# Patient Record
Sex: Male | Born: 1945 | Race: White | Hispanic: No | Smoking: Never smoker
Health system: Southern US, Community
[De-identification: ages and names within clinical notes are randomized; demographics above are authoritative.]

## PROBLEM LIST (undated history)

## (undated) DIAGNOSIS — N400 Enlarged prostate without lower urinary tract symptoms: Secondary | ICD-10-CM

## (undated) DIAGNOSIS — E785 Hyperlipidemia, unspecified: Secondary | ICD-10-CM

## (undated) DIAGNOSIS — F329 Major depressive disorder, single episode, unspecified: Secondary | ICD-10-CM

## (undated) DIAGNOSIS — I10 Essential (primary) hypertension: Secondary | ICD-10-CM

## (undated) DIAGNOSIS — T7840XA Allergy, unspecified, initial encounter: Secondary | ICD-10-CM

## (undated) HISTORY — DX: Hyperlipidemia, unspecified: E78.5

## (undated) HISTORY — DX: Benign prostatic hyperplasia without lower urinary tract symptoms: N40.0

## (undated) HISTORY — PX: NO PAST SURGERIES: SHX2092

## (undated) HISTORY — DX: Major depressive disorder, single episode, unspecified: F32.9

## (undated) HISTORY — PX: COLONOSCOPY: SHX174

## (undated) HISTORY — DX: Essential (primary) hypertension: I10

## (undated) HISTORY — DX: Allergy, unspecified, initial encounter: T78.40XA

---

## 2004-01-07 ENCOUNTER — Ambulatory Visit: Payer: Self-pay | Admitting: Internal Medicine

## 2004-01-19 ENCOUNTER — Ambulatory Visit: Payer: Self-pay | Admitting: Gastroenterology

## 2004-02-16 ENCOUNTER — Encounter: Payer: Self-pay | Admitting: Internal Medicine

## 2004-02-16 ENCOUNTER — Ambulatory Visit: Payer: Self-pay | Admitting: Gastroenterology

## 2004-07-07 ENCOUNTER — Ambulatory Visit: Payer: Self-pay | Admitting: Internal Medicine

## 2005-01-07 ENCOUNTER — Ambulatory Visit: Payer: Self-pay | Admitting: Internal Medicine

## 2005-01-14 ENCOUNTER — Ambulatory Visit: Payer: Self-pay | Admitting: Internal Medicine

## 2006-01-13 ENCOUNTER — Ambulatory Visit: Payer: Self-pay | Admitting: Internal Medicine

## 2006-01-13 LAB — CONVERTED CEMR LAB
BUN: 16 mg/dL (ref 6–23)
Basophils Absolute: 0 10*3/uL (ref 0.0–0.1)
CO2: 33 meq/L — ABNORMAL HIGH (ref 19–32)
Calcium: 9.9 mg/dL (ref 8.4–10.5)
Chloride: 106 meq/L (ref 96–112)
Creatinine, Ser: 1.1 mg/dL (ref 0.4–1.5)
HDL: 36.7 mg/dL — ABNORMAL LOW (ref >39.0)
Hemoglobin: 16.1 g/dL (ref 13.0–17.0)
LDL DIRECT: 99.7 mg/dL
Lymphocytes Relative: 28.5 % (ref 12.0–46.0)
MCHC: 35 g/dL (ref 30.0–36.0)
MCV: 88.2 fL (ref 78.0–100.0)
Monocytes Relative: 8 % (ref 3.0–11.0)
Neutro Abs: 3.8 10*3/uL (ref 1.4–7.7)
Neutrophils Relative %: 61.1 % (ref 43.0–77.0)
Potassium: 4 meq/L (ref 3.5–5.1)
TSH: 0.86 microintl units/mL (ref 0.35–5.50)
Triglyceride fasting, serum: 222 mg/dL (ref 0–149)

## 2006-01-20 ENCOUNTER — Ambulatory Visit: Payer: Self-pay | Admitting: Internal Medicine

## 2006-05-16 ENCOUNTER — Ambulatory Visit: Payer: Self-pay | Admitting: Internal Medicine

## 2006-06-20 ENCOUNTER — Ambulatory Visit: Payer: Self-pay | Admitting: Internal Medicine

## 2006-08-01 ENCOUNTER — Ambulatory Visit: Payer: Self-pay | Admitting: Internal Medicine

## 2006-08-01 ENCOUNTER — Encounter: Payer: Self-pay | Admitting: Internal Medicine

## 2006-08-01 DIAGNOSIS — E785 Hyperlipidemia, unspecified: Secondary | ICD-10-CM | POA: Insufficient documentation

## 2006-08-01 DIAGNOSIS — I10 Essential (primary) hypertension: Secondary | ICD-10-CM

## 2006-08-01 DIAGNOSIS — F3289 Other specified depressive episodes: Secondary | ICD-10-CM

## 2006-08-01 DIAGNOSIS — N400 Enlarged prostate without lower urinary tract symptoms: Secondary | ICD-10-CM

## 2006-08-01 DIAGNOSIS — F329 Major depressive disorder, single episode, unspecified: Secondary | ICD-10-CM

## 2006-08-01 DIAGNOSIS — F339 Major depressive disorder, recurrent, unspecified: Secondary | ICD-10-CM | POA: Insufficient documentation

## 2006-08-01 HISTORY — DX: Benign prostatic hyperplasia without lower urinary tract symptoms: N40.0

## 2006-08-01 HISTORY — DX: Other specified depressive episodes: F32.89

## 2006-08-01 HISTORY — DX: Major depressive disorder, single episode, unspecified: F32.9

## 2006-08-01 HISTORY — DX: Essential (primary) hypertension: I10

## 2006-08-01 HISTORY — DX: Hyperlipidemia, unspecified: E78.5

## 2006-12-28 ENCOUNTER — Encounter: Payer: Self-pay | Admitting: Internal Medicine

## 2007-01-18 ENCOUNTER — Ambulatory Visit: Payer: Self-pay | Admitting: Internal Medicine

## 2007-01-18 LAB — CONVERTED CEMR LAB
ALT: 27 units/L (ref 0–53)
Alkaline Phosphatase: 58 units/L (ref 39–117)
BUN: 19 mg/dL (ref 6–23)
Basophils Absolute: 0 10*3/uL (ref 0.0–0.1)
Blood in Urine, dipstick: NEGATIVE
Calcium: 9.6 mg/dL (ref 8.4–10.5)
Eosinophils Absolute: 0.1 10*3/uL (ref 0.0–0.6)
GFR calc Af Amer: 88 mL/min
GFR calc non Af Amer: 73 mL/min
HDL: 34.7 mg/dL — ABNORMAL LOW (ref >39.0)
Ketones, urine, test strip: NEGATIVE
Lymphocytes Relative: 25.2 % (ref 12.0–46.0)
MCHC: 35.2 g/dL (ref 30.0–36.0)
MCV: 88.2 fL (ref 78.0–100.0)
Monocytes Relative: 6.9 % (ref 3.0–11.0)
Neutro Abs: 3.8 10*3/uL (ref 1.4–7.7)
Nitrite: NEGATIVE
Platelets: 204 10*3/uL (ref 150–400)
Specific Gravity, Urine: 1.015
TSH: 1.24 microintl units/mL (ref 0.35–5.50)
Triglycerides: 125 mg/dL (ref 0–149)
VLDL: 25 mg/dL (ref 0–40)
pH: 7

## 2007-01-25 ENCOUNTER — Ambulatory Visit: Payer: Self-pay | Admitting: Internal Medicine

## 2007-05-10 ENCOUNTER — Encounter: Payer: Self-pay | Admitting: Internal Medicine

## 2007-07-25 ENCOUNTER — Ambulatory Visit: Payer: Self-pay | Admitting: Internal Medicine

## 2007-10-15 ENCOUNTER — Telehealth: Payer: Self-pay | Admitting: Internal Medicine

## 2007-10-22 ENCOUNTER — Telehealth: Payer: Self-pay | Admitting: Internal Medicine

## 2007-11-29 ENCOUNTER — Encounter: Payer: Self-pay | Admitting: Internal Medicine

## 2008-01-14 ENCOUNTER — Telehealth: Payer: Self-pay | Admitting: Internal Medicine

## 2008-01-15 ENCOUNTER — Encounter: Payer: Self-pay | Admitting: Internal Medicine

## 2008-01-15 ENCOUNTER — Telehealth: Payer: Self-pay | Admitting: Internal Medicine

## 2008-01-18 ENCOUNTER — Ambulatory Visit: Payer: Self-pay | Admitting: Internal Medicine

## 2008-01-18 LAB — CONVERTED CEMR LAB
Basophils Absolute: 0 10*3/uL (ref 0.0–0.1)
Basophils Relative: 0.5 % (ref 0.0–3.0)
Bilirubin Urine: NEGATIVE
Bilirubin, Direct: 0.1 mg/dL (ref 0.0–0.3)
Blood in Urine, dipstick: NEGATIVE
CO2: 32 meq/L (ref 19–32)
Calcium: 9.4 mg/dL (ref 8.4–10.5)
Cholesterol: 183 mg/dL (ref 0–200)
Creatinine, Ser: 1 mg/dL (ref 0.4–1.5)
Direct LDL: 99.1 mg/dL
Eosinophils Absolute: 0.1 10*3/uL (ref 0.0–0.7)
GFR calc Af Amer: 98 mL/min
GFR calc non Af Amer: 81 mL/min
Glucose, Urine, Semiquant: NEGATIVE
HDL: 36.7 mg/dL — ABNORMAL LOW (ref >39.0)
Hemoglobin: 15.4 g/dL (ref 13.0–17.0)
Lymphocytes Relative: 27 % (ref 12.0–46.0)
MCHC: 35 g/dL (ref 30.0–36.0)
MCV: 90.5 fL (ref 78.0–100.0)
Neutro Abs: 3.7 10*3/uL (ref 1.4–7.7)
PSA: 0.24 ng/mL (ref 0.10–4.00)
RDW: 12.3 % (ref 11.5–14.6)
Sodium: 146 meq/L — ABNORMAL HIGH (ref 135–145)
Specific Gravity, Urine: 1.015
TSH: 1.01 microintl units/mL (ref 0.35–5.50)
Total Bilirubin: 1 mg/dL (ref 0.3–1.2)
VLDL: 45 mg/dL — ABNORMAL HIGH (ref 0–40)
pH: 8.5

## 2008-01-30 ENCOUNTER — Ambulatory Visit: Payer: Self-pay | Admitting: Internal Medicine

## 2009-02-02 ENCOUNTER — Ambulatory Visit: Payer: Self-pay | Admitting: Internal Medicine

## 2009-09-24 ENCOUNTER — Telehealth: Payer: Self-pay | Admitting: Internal Medicine

## 2009-12-14 ENCOUNTER — Telehealth: Payer: Self-pay | Admitting: Internal Medicine

## 2010-04-04 LAB — CONVERTED CEMR LAB: AST: 25 units/L (ref 0–37)

## 2010-04-06 NOTE — Progress Notes (Signed)
Summary: maxzide caps on back order  Phone Note From Pharmacy   Caller: [facility] DrMarland Kitchen Summary of Call: maxzide capsules are on back-order - can it be changes to tablets?  [facility] - [phone] Initial call taken by: Duard Brady LPN,  September 24, 2009 4:21 PM  Follow-up for Phone Call        yes #90  one daily  RF 4-generic OK Follow-up by: Gordy Savers  MD,  September 24, 2009 5:28 PM  Additional Follow-up for Phone Call Additional follow up Details #1::        [facility] - spoke with jackie. KIK Additional Follow-up by: Duard Brady LPN,  September 24, 2009 5:33 PM    Prescriptions: TRIAMTERENE-HCTZ 37.5-25 MG CAPS (TRIAMTERENE-HCTZ) 1 once daily  #90 Capsule x 4   Entered by:   Duard Brady LPN   Authorized by:   Gordy Savers  MD   Signed by:   Duard Brady LPN on 60/45/4098   Method used:   Historical   RxID:   [id number]

## 2010-04-06 NOTE — Progress Notes (Signed)
Summary: refill temazepam  Phone Note Refill Request Message from:  Fax from Pharmacy on December 14, 2009 9:11 AM  Refills Requested: Medication #1:  TEMAZEPAM 30 MG  CAPS qhs as needed   Last Ref[facility]: [facility]/14/2009 [facility]   Method Requested: Fax to Local Pharmacy Initial call taken by: Duard Brady LPN,  December 14, 2009 9:11 AM    Prescriptions: TEMAZEPAM 30 MG  CAPS (TEMAZEPAM) qhs as needed  #30 x 3   Entered by:   Duard Brady LPN   Authorized by:   Gordy Savers  MD   Signed by:   Duard Brady LPN on 16/12/9602   Method used:   Historical   RxID:   [id number]

## 2010-07-23 NOTE — Assessment & Plan Note (Signed)
[facility]                            [facility] OFFICE NOTE   NAME:Fielding, ARNAV CREGG                        MRN:          [id number]  DATE:01/20/2006                            DOB:          [birth year]    A 65 year old gentleman seen today for an annual exam. He has a history  of hypertension, dyslipidemia, episodic insomnia, and has a history also  of mild BPH.   FAMILY HISTORY:  Reviewed and basically unchanged.   VITAL SIGNS:  Blood pressure 130/78.  SKIN:  Negative.  HEENT:  Fundi, ear, nose and throat clear.  NECK:  No adenopathy or bruits.  CHEST:  Clear.  CARDIOVASCULAR:  Normal heart sounds, no murmurs.  ABDOMEN:  Benign.  EXTERNAL GENITALIA:  Normal.  RECTAL:  Prostate at least +2 and large with left lobe slightly more  prominent. Stool heme negative.  EXTREMITIES:  Negative. Full peripheral pulses.  NEUROLOGIC:  Negative.   IMPRESSION:  1. Hypertension.  2. Dyslipidemia.  3. Seasonal allergic rhinitis.   DISPOSITION:  Will continue his present regimen. Prescriptions  dispensed. Will reassess in one year.     Gordy Savers, MD  Electronically Signed    PFK/MedQ  DD: 01/20/2006  DT: 01/20/2006  Job #: [id number]

## 2010-07-29 ENCOUNTER — Other Ambulatory Visit: Payer: Self-pay | Admitting: Internal Medicine

## 2010-08-31 ENCOUNTER — Other Ambulatory Visit (INDEPENDENT_AMBULATORY_CARE_PROVIDER_SITE_OTHER): Payer: PRIVATE HEALTH INSURANCE

## 2010-08-31 DIAGNOSIS — Z0000 Encounter for general adult medical examination without abnormal findings: Secondary | ICD-10-CM

## 2010-08-31 LAB — BASIC METABOLIC PANEL
Calcium: 9.4 mg/dL (ref 8.4–10.5)
GFR: 71.62 mL/min (ref >60.00)
Potassium: 4.2 mEq/L (ref 3.5–5.1)
Sodium: 143 mEq/L (ref 135–145)

## 2010-08-31 LAB — POCT URINALYSIS DIPSTICK
Bilirubin, UA: NEGATIVE
Ketones, UA: NEGATIVE
Leukocytes, UA: NEGATIVE
pH, UA: 7

## 2010-08-31 LAB — CBC WITH DIFFERENTIAL/PLATELET
Basophils Absolute: 0 10*3/uL (ref 0.0–0.1)
Eosinophils Absolute: 0.2 10*3/uL (ref 0.0–0.7)
HCT: 45.9 % (ref 39.0–52.0)
Hemoglobin: 15.8 g/dL (ref 13.0–17.0)
Lymphs Abs: 1.6 10*3/uL (ref 0.7–4.0)
MCHC: 34.4 g/dL (ref 30.0–36.0)
MCV: 90.4 fl (ref 78.0–100.0)
Monocytes Absolute: 0.5 10*3/uL (ref 0.1–1.0)
Monocytes Relative: 7.3 % (ref 3.0–12.0)
Neutro Abs: 4.7 10*3/uL (ref 1.4–7.7)
RDW: 13 % (ref 11.5–14.6)

## 2010-08-31 LAB — HEPATIC FUNCTION PANEL
Alkaline Phosphatase: 57 U/L (ref 39–117)
Bilirubin, Direct: 0.1 mg/dL (ref 0.0–0.3)
Total Protein: 6.7 g/dL (ref 6.0–8.3)

## 2010-08-31 LAB — LIPID PANEL: VLDL: 70.2 mg/dL — ABNORMAL HIGH (ref 0.0–40.0)

## 2010-08-31 LAB — LDL CHOLESTEROL, DIRECT: Direct LDL: 123.5 mg/dL

## 2010-09-06 ENCOUNTER — Encounter: Payer: Self-pay | Admitting: Internal Medicine

## 2010-09-07 ENCOUNTER — Encounter: Payer: Self-pay | Admitting: Internal Medicine

## 2010-09-07 ENCOUNTER — Ambulatory Visit (INDEPENDENT_AMBULATORY_CARE_PROVIDER_SITE_OTHER): Payer: PRIVATE HEALTH INSURANCE | Admitting: Internal Medicine

## 2010-09-07 VITALS — BP 120/70 | HR 70 | Temp 98.0°F | Resp 18 | Ht 71.0 in | Wt 185.0 lb

## 2010-09-07 DIAGNOSIS — Z0000 Encounter for general adult medical examination without abnormal findings: Secondary | ICD-10-CM

## 2010-09-07 DIAGNOSIS — Z23 Encounter for immunization: Secondary | ICD-10-CM

## 2010-09-07 DIAGNOSIS — I10 Essential (primary) hypertension: Secondary | ICD-10-CM

## 2010-09-07 MED ORDER — PRAVASTATIN SODIUM 20 MG PO TABS: 40.0000 mg | ORAL_TABLET | Freq: Every day | ORAL | Status: DC

## 2010-09-07 MED ORDER — TEMAZEPAM 30 MG PO CAPS: 30.0000 mg | ORAL_CAPSULE | Freq: Every evening | ORAL | Status: DC | PRN

## 2010-09-07 MED ORDER — TRIAMTERENE-HCTZ 37.5-25 MG PO CAPS: 1.0000 | ORAL_CAPSULE | ORAL | Status: DC

## 2010-09-07 MED ORDER — PAROXETINE HCL 20 MG PO TABS: 20.0000 mg | ORAL_TABLET | ORAL | Status: DC

## 2010-09-07 NOTE — Patient Instructions (Signed)
Limit your sodium (Salt) intake    It is important that you exercise regularly, at least 20 minutes 3 to 4 times per week.  If you develop chest pain or shortness of breath seek  medical attention. 

## 2010-09-07 NOTE — Progress Notes (Signed)
Subjective:    Patient ID: Richard Sparks, male    DOB: Jun 03, 1945, 65 y.o.   MRN: [id number]  HPI  65 year old patient who is seen today for an annual exam. Allergies:  1) Amoxicillin (Amoxicillin)   Past History:  Past Medical History:  Reviewed history from 08/01/2006 and no changes required.  Depression  Hypertension  Benign prostatic hypertrophy  Hyperlipidemia  Skin cancer  Past Surgical History:  Reviewed history from 01/30/2008 and no changes required.  no prior surgeries  colonoscopy 2005   Family History:  Reviewed history from 01/25/2007 and no changes required.  father age 59, has a history of a thyroid disorder in type 2 diabetes  mother, age 1, is in good health  One brother 3 sisters, all remain in good health  a niece died at age 62 of a brain tumor. Otherwise no family history of cancer   Social History:  Reviewed history from 01/30/2008 and no changes required.  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  HENT: Negative for hearing loss, ear pain, congestion, rhinorrhea, sneezing, mouth sores, trouble swallowing, neck pain, neck stiffness, dental problem, voice change, sinus pressure and tinnitus.   Eyes: Negative for photophobia, pain, redness and visual disturbance.  Respiratory: Negative for apnea, cough, choking, chest tightness, shortness of breath and wheezing.   Cardiovascular: Negative for chest pain, palpitations and leg swelling.  Gastrointestinal: Negative for nausea, vomiting, abdominal pain, diarrhea, constipation, blood in stool, abdominal distention, anal bleeding and rectal pain.  Genitourinary: Negative for dysuria, urgency, frequency, hematuria, flank pain, decreased urine volume, discharge, penile swelling, scrotal swelling,  difficulty urinating, genital sores and testicular pain.  Musculoskeletal: Negative for myalgias, back pain, joint swelling, arthralgias and gait problem.  Skin: Negative for color change, rash and wound.  Neurological: Negative for dizziness, tremors, seizures, syncope, facial asymmetry, speech difficulty, weakness, light-headedness, numbness and headaches.  Hematological: Negative for adenopathy. Does not bruise/bleed easily.  Psychiatric/Behavioral: Negative for suicidal ideas, hallucinations, behavioral problems, confusion, sleep disturbance, self-injury, dysphoric mood, decreased concentration and agitation. The patient is not nervous/anxious.        Objective:   Physical Exam  Constitutional: He appears well-developed and well-nourished.  HENT:  Head: Normocephalic and atraumatic.  Right Ear: External ear normal.  Left Ear: External ear normal.  Nose: Nose normal.  Mouth/Throat: Oropharynx is clear and moist.  Eyes: Conjunctivae and EOM are normal. Pupils are equal, round, and reactive to light. No scleral icterus.  Neck: Normal range of motion. Neck supple. No JVD present. No thyromegaly present.  Cardiovascular: Regular rhythm, normal heart sounds and intact distal pulses.  Exam reveals no gallop and no friction rub.   No murmur heard. Pulmonary/Chest: Effort normal and breath sounds normal. He exhibits no tenderness.  Abdominal: Soft. Bowel sounds are normal. He exhibits no distension and no mass. There is no tenderness.  Genitourinary: Prostate normal and penis normal.  Musculoskeletal: Normal range of motion. He exhibits no edema and no tenderness.  Lymphadenopathy:    He has no cervical adenopathy.  Neurological: He is alert. He has normal reflexes. No cranial nerve deficit. Coordination normal.  Skin: Skin is warm and dry. No rash noted.  Psychiatric: He has a normal mood and affect. His behavior is normal.  Assessment & Plan:  Annual clinical exam  and Hypertension controlled Dyslipidemia. Will increase pravastatin to 40 mg daily  Recheck 6 months

## 2011-04-27 ENCOUNTER — Encounter: Payer: Self-pay | Admitting: Internal Medicine

## 2011-04-27 ENCOUNTER — Ambulatory Visit (INDEPENDENT_AMBULATORY_CARE_PROVIDER_SITE_OTHER): Payer: PRIVATE HEALTH INSURANCE | Admitting: Internal Medicine

## 2011-04-27 DIAGNOSIS — F329 Major depressive disorder, single episode, unspecified: Secondary | ICD-10-CM

## 2011-04-27 DIAGNOSIS — F3289 Other specified depressive episodes: Secondary | ICD-10-CM

## 2011-04-27 DIAGNOSIS — I10 Essential (primary) hypertension: Secondary | ICD-10-CM

## 2011-04-27 DIAGNOSIS — S90129A Contusion of unspecified lesser toe(s) without damage to nail, initial encounter: Secondary | ICD-10-CM

## 2011-04-27 DIAGNOSIS — E785 Hyperlipidemia, unspecified: Secondary | ICD-10-CM

## 2011-04-27 NOTE — Progress Notes (Signed)
  Subjective:    Patient ID: Richard Sparks, male    DOB: 02-Oct-1945, 66 y.o.   MRN: [id number]  HPI  65 year old patient who presents with a chief complaint of pain and minimal swelling involving his left second toe. He has treated hypertension which has been stable and also a history of depression   Review of Systems  Musculoskeletal: Positive for arthralgias.       Objective:   Physical Exam  Constitutional: He appears well-developed and well-nourished. No distress.       Blood pressure well controlled  Musculoskeletal:       Extremities left toe revealed  minimal soft tissue swelling Pedal pulses were full No signs of active infection or arthritis          Assessment & Plan:   Hypertension stable Contusion left second toe; proper foot care discussed. Will call if unimproved  Schedule CPX 6 months

## 2011-04-27 NOTE — Patient Instructions (Signed)
Limit your sodium (Salt) intake    It is important that you exercise regularly, at least 20 minutes 3 to 4 times per week.  If you develop chest pain or shortness of breath seek  medical attention.  Return in 6 months for follow-up  

## 2011-08-02 ENCOUNTER — Other Ambulatory Visit: Payer: Self-pay | Admitting: Internal Medicine

## 2011-09-12 ENCOUNTER — Other Ambulatory Visit: Payer: Self-pay | Admitting: Internal Medicine

## 2011-09-18 ENCOUNTER — Other Ambulatory Visit: Payer: Self-pay | Admitting: Internal Medicine

## 2011-10-13 ENCOUNTER — Other Ambulatory Visit: Payer: Self-pay

## 2011-10-13 MED ORDER — TEMAZEPAM 30 MG PO CAPS: 30.0000 mg | ORAL_CAPSULE | Freq: Every evening | ORAL | Status: DC | PRN

## 2011-10-18 ENCOUNTER — Other Ambulatory Visit (INDEPENDENT_AMBULATORY_CARE_PROVIDER_SITE_OTHER): Payer: Medicare Other

## 2011-10-18 DIAGNOSIS — Z125 Encounter for screening for malignant neoplasm of prostate: Secondary | ICD-10-CM

## 2011-10-18 DIAGNOSIS — E785 Hyperlipidemia, unspecified: Secondary | ICD-10-CM

## 2011-10-18 DIAGNOSIS — Z79899 Other long term (current) drug therapy: Secondary | ICD-10-CM

## 2011-10-18 DIAGNOSIS — Z0000 Encounter for general adult medical examination without abnormal findings: Secondary | ICD-10-CM

## 2011-10-18 LAB — CBC WITH DIFFERENTIAL/PLATELET
Eosinophils Relative: 3.6 % (ref 0.0–5.0)
HCT: 45.7 % (ref 39.0–52.0)
Hemoglobin: 15.3 g/dL (ref 13.0–17.0)
Lymphs Abs: 1.8 10*3/uL (ref 0.7–4.0)
MCV: 89.9 fl (ref 78.0–100.0)
Monocytes Absolute: 0.4 10*3/uL (ref 0.1–1.0)
Monocytes Relative: 7.7 % (ref 3.0–12.0)
Neutro Abs: 3.1 10*3/uL (ref 1.4–7.7)
RDW: 13 % (ref 11.5–14.6)
WBC: 5.6 10*3/uL (ref 4.5–10.5)

## 2011-10-18 LAB — BASIC METABOLIC PANEL
Chloride: 104 mEq/L (ref 96–112)
GFR: 71.37 mL/min (ref >60.00)
Glucose, Bld: 99 mg/dL (ref 70–99)
Potassium: 4 mEq/L (ref 3.5–5.1)
Sodium: 140 mEq/L (ref 135–145)

## 2011-10-18 LAB — POCT URINALYSIS DIPSTICK
Bilirubin, UA: NEGATIVE
Glucose, UA: NEGATIVE
Ketones, UA: NEGATIVE
Spec Grav, UA: 1.025

## 2011-10-18 LAB — HEPATIC FUNCTION PANEL
ALT: 18 U/L (ref 0–53)
AST: 21 U/L (ref 0–37)
Albumin: 3.7 g/dL (ref 3.5–5.2)

## 2011-10-18 LAB — PSA: PSA: 1.13 ng/mL (ref 0.10–4.00)

## 2011-10-18 LAB — TSH: TSH: 0.97 u[IU]/mL (ref 0.35–5.50)

## 2011-10-18 LAB — LDL CHOLESTEROL, DIRECT: Direct LDL: 111.7 mg/dL

## 2011-10-25 ENCOUNTER — Encounter: Payer: Self-pay | Admitting: Internal Medicine

## 2011-10-25 ENCOUNTER — Ambulatory Visit (INDEPENDENT_AMBULATORY_CARE_PROVIDER_SITE_OTHER): Payer: Medicare Other | Admitting: Internal Medicine

## 2011-10-25 ENCOUNTER — Telehealth: Payer: Self-pay | Admitting: Internal Medicine

## 2011-10-25 VITALS — BP 100/70 | HR 82 | Temp 97.5°F | Resp 18 | Ht 71.0 in | Wt 187.0 lb

## 2011-10-25 DIAGNOSIS — Z23 Encounter for immunization: Secondary | ICD-10-CM

## 2011-10-25 DIAGNOSIS — I10 Essential (primary) hypertension: Secondary | ICD-10-CM

## 2011-10-25 DIAGNOSIS — Z0000 Encounter for general adult medical examination without abnormal findings: Secondary | ICD-10-CM

## 2011-10-25 MED ORDER — TRIAMTERENE-HCTZ 37.5-25 MG PO CAPS: 1.0000 | ORAL_CAPSULE | ORAL | Status: DC

## 2011-10-25 MED ORDER — PAROXETINE HCL 20 MG PO TABS: 20.0000 mg | ORAL_TABLET | ORAL | Status: DC

## 2011-10-25 MED ORDER — PRAVASTATIN SODIUM 40 MG PO TABS: 40.0000 mg | ORAL_TABLET | Freq: Every day | ORAL | Status: DC

## 2011-10-25 MED ORDER — TEMAZEPAM 15 MG PO CAPS: 15.0000 mg | ORAL_CAPSULE | Freq: Every evening | ORAL | Status: DC | PRN

## 2011-10-25 MED ORDER — PRAVASTATIN SODIUM 20 MG PO TABS: 20.0000 mg | ORAL_TABLET | Freq: Every day | ORAL | Status: DC

## 2011-10-25 NOTE — Patient Instructions (Signed)
It is important that you exercise regularly, at least 20 minutes 3 to 4 times per week.  If you develop chest pain or shortness of breath seek  medical attention.  Limit your sodium (Salt) intake  Please check your blood pressure on a regular basis.  If it is consistently greater than 150/90, please make an office appointment.  Return in one year for follow-up   

## 2011-10-25 NOTE — Progress Notes (Signed)
Patient ID: Richard Sparks, male   DOB: [birth year], 66 y.o.   MRN: [id number]  Subjective:    Patient ID: Richard Sparks, male    DOB: [birth year], 66 y.o.   MRN: [id number]  Hypertension Pertinent negatives include no chest pain, headaches, neck pain, palpitations or shortness of breath.  66year-old patient who is seen today for an annual exam.  1. Risk factors, based on past  M,S,F history- cardiovascular risk factors include hypertension and dyslipidemia. Family history of longevity  2.  Physical activities: No exercise limitations. Does go to his health club 3 times weekly  3.  Depression/mood: History depression well controlled with Paxil   4.  Hearing: No deficits  5.  ADL's:  Independent in all aspects of daily living  6.  Fall risk:  Low  7.  Home safety:  No problems identified in  8.  Height weight, and visual acuity; height and weight stable no change in visual acuity  9.  Counseling: Heart healthy diet regular exercise regimen encouraged  10. Lab orders based on risk factors: Laboratory profile including lipid panel reviewed  11. Referral : Not appropriate at this time  12. Care plan: Continue a regular exercise program heart healthy diet  13. Cognitive assessment: Alert and oriented with normal affect. No cognitive dysfunction    Allergies:  1) Amoxicillin (Amoxicillin)   Past History:  Past Medical History:  Reviewed history from 08/01/2006 and no changes required.  Depression  Hypertension  Benign prostatic hypertrophy  Hyperlipidemia  Skin cancer  Past Surgical History:  Reviewed history from 01/30/2008 and no changes required.  no prior surgeries  colonoscopy 2005   Family History:  Reviewed history from 01/25/2007 and no changes required.  father age 37, has a history of a thyroid disorder in type 2 diabetes  mother, age 47, is in good health  One brother 3 sisters, all remain in good health  a niece died at age 26 of a brain tumor. Otherwise no  family history of cancer   Social History:  Reviewed history from 01/30/2008 and no changes required.  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  HENT: Negative for hearing loss, ear pain, congestion, rhinorrhea, sneezing, mouth sores, trouble swallowing, neck pain, neck stiffness, dental problem, voice change, sinus pressure and tinnitus.   Eyes: Negative for photophobia, pain, redness and visual disturbance.  Respiratory: Negative for apnea, cough, choking, chest tightness, shortness of breath and wheezing.   Cardiovascular: Negative for chest pain, palpitations and leg swelling.  Gastrointestinal: Negative for nausea, vomiting, abdominal pain, diarrhea, constipation, blood in stool, abdominal distention, anal bleeding and rectal pain.  Genitourinary: Negative for dysuria, urgency, frequency, hematuria, flank pain, decreased urine volume, discharge, penile swelling, scrotal swelling, difficulty urinating, genital sores and testicular pain.  Musculoskeletal: Negative for myalgias, back pain, joint swelling, arthralgias and gait problem.  Skin: Negative for color change, rash and wound.  Neurological: Negative for dizziness, tremors, seizures, syncope, facial asymmetry, speech difficulty, weakness, light-headedness, numbness and headaches.  Hematological: Negative for adenopathy. Does not bruise/bleed easily.  Psychiatric/Behavioral: Negative for suicidal ideas, hallucinations, behavioral problems, confusion, disturbed wake/sleep cycle, self-injury, dysphoric mood, decreased concentration and agitation. The patient is not nervous/anxious.        Objective:   Physical Exam  Constitutional: He appears well-developed and well-nourished.  HENT:  Head: Normocephalic and  atraumatic.  Right Ear: External ear normal.  Left Ear: External ear normal.  Nose: Nose normal.  Mouth/Throat: Oropharynx is clear and moist.  Eyes: Conjunctivae and EOM are normal. Pupils are equal, round, and reactive to light. No scleral icterus.  Neck: Normal range of motion. Neck supple. No JVD present. No thyromegaly present.  Cardiovascular: Regular rhythm, normal heart sounds and intact distal pulses.  Exam reveals no gallop and no friction rub.   No murmur heard.      Diminished right dorsalis pedis pulse  Pulmonary/Chest: Effort normal and breath sounds normal. He exhibits no tenderness.  Abdominal: Soft. Bowel sounds are normal. He exhibits no distension and no mass. There is no tenderness.  Genitourinary: Prostate normal and penis normal.  Musculoskeletal: Normal range of motion. He exhibits no edema and no tenderness.  Lymphadenopathy:    He has no cervical adenopathy.  Neurological: He is alert. He has normal reflexes. No cranial nerve deficit. Coordination normal.  Skin: Skin is warm and dry. No rash noted.  Psychiatric: He has a normal mood and affect. His behavior is normal.          Assessment & Plan:  Annual clinical exam  Hypertension controlled Dyslipidemia. Will increase pravastatin to 40 mg daily  Recheck 1 year

## 2011-10-25 NOTE — Telephone Encounter (Signed)
Spoke with pharmacy - pt was given printed rx at office appt this AM

## 2011-10-25 NOTE — Telephone Encounter (Signed)
Pharmacy called about temazepam (RESTORIL) 15 MG capsule pt states that he did not receive a paper script. Please contact pharmacy

## 2011-12-25 ENCOUNTER — Other Ambulatory Visit: Payer: Self-pay | Admitting: Internal Medicine

## 2012-08-03 ENCOUNTER — Encounter: Payer: Self-pay | Admitting: Internal Medicine

## 2012-08-03 ENCOUNTER — Ambulatory Visit (INDEPENDENT_AMBULATORY_CARE_PROVIDER_SITE_OTHER): Payer: Medicare Other | Admitting: Internal Medicine

## 2012-08-03 VITALS — BP 120/80 | HR 68 | Temp 97.5°F | Resp 20 | Wt 181.0 lb

## 2012-08-03 DIAGNOSIS — M7660 Achilles tendinitis, unspecified leg: Secondary | ICD-10-CM

## 2012-08-03 DIAGNOSIS — M7662 Achilles tendinitis, left leg: Secondary | ICD-10-CM

## 2012-08-03 DIAGNOSIS — I10 Essential (primary) hypertension: Secondary | ICD-10-CM

## 2012-08-03 NOTE — Progress Notes (Signed)
  Subjective:    Patient ID: Richard Sparks, male    DOB: [birth year], 67 y.o.   MRN: [id number]  HPI 67 year old patient who presents with a one-week history of left heel pain. He has treated hypertension and is followed by psychiatry for depression    Review of Systems  Musculoskeletal:       Left posterior heel pain       Objective:   Physical Exam  Constitutional: He appears well-developed and well-nourished. No distress.  Musculoskeletal:  Tenderness over the posterior heel at the insertion of the Achilles tendon          Assessment & Plan:   Achilles tendinitis.  We'll apply ice and use anti-inflammatories over the weekend. Will call if unimproved

## 2012-08-03 NOTE — Patient Instructions (Signed)
You  may move around, but avoid painful motions and activities.  Apply ice to the sore area for 15 to 20 minutes 3 or 4 times daily for the next two to 3 days.  Take Aleve 200 mg twice daily for pain or swelling   

## 2012-08-06 ENCOUNTER — Telehealth: Payer: Self-pay | Admitting: Internal Medicine

## 2012-08-06 NOTE — Telephone Encounter (Signed)
fyi Pt was seen on Friday and was told to callback on medication prescribed by another provider clonazepam 0.5mg .

## 2012-10-18 ENCOUNTER — Other Ambulatory Visit (INDEPENDENT_AMBULATORY_CARE_PROVIDER_SITE_OTHER): Payer: Medicare Other

## 2012-10-18 DIAGNOSIS — N400 Enlarged prostate without lower urinary tract symptoms: Secondary | ICD-10-CM

## 2012-10-18 DIAGNOSIS — I10 Essential (primary) hypertension: Secondary | ICD-10-CM

## 2012-10-18 DIAGNOSIS — Z0000 Encounter for general adult medical examination without abnormal findings: Secondary | ICD-10-CM

## 2012-10-18 DIAGNOSIS — E785 Hyperlipidemia, unspecified: Secondary | ICD-10-CM

## 2012-10-18 LAB — CBC WITH DIFFERENTIAL/PLATELET
Eosinophils Relative: 2.5 % (ref 0.0–5.0)
HCT: 46.9 % (ref 39.0–52.0)
Hemoglobin: 15.7 g/dL (ref 13.0–17.0)
Lymphs Abs: 1.8 10*3/uL (ref 0.7–4.0)
MCV: 89 fl (ref 78.0–100.0)
Monocytes Absolute: 0.5 10*3/uL (ref 0.1–1.0)
Monocytes Relative: 8.1 % (ref 3.0–12.0)
Neutro Abs: 4.1 10*3/uL (ref 1.4–7.7)
Platelets: 228 10*3/uL (ref 150.0–400.0)
RDW: 13.9 % (ref 11.5–14.6)
WBC: 6.7 10*3/uL (ref 4.5–10.5)

## 2012-10-18 LAB — HEPATIC FUNCTION PANEL
ALT: 22 U/L (ref 0–53)
AST: 19 U/L (ref 0–37)
Albumin: 3.7 g/dL (ref 3.5–5.2)
Total Bilirubin: 1.1 mg/dL (ref 0.3–1.2)

## 2012-10-18 LAB — BASIC METABOLIC PANEL
BUN: 19 mg/dL (ref 6–23)
Chloride: 103 mEq/L (ref 96–112)
Glucose, Bld: 91 mg/dL (ref 70–99)
Potassium: 4 mEq/L (ref 3.5–5.1)
Sodium: 139 mEq/L (ref 135–145)

## 2012-10-18 LAB — POCT URINALYSIS DIPSTICK
Bilirubin, UA: NEGATIVE
Blood, UA: NEGATIVE
Leukocytes, UA: NEGATIVE
Nitrite, UA: NEGATIVE
Protein, UA: NEGATIVE
Urobilinogen, UA: 0.2
pH, UA: 6

## 2012-10-18 LAB — TSH: TSH: 1.16 u[IU]/mL (ref 0.35–5.50)

## 2012-10-18 LAB — LIPID PANEL: Total CHOL/HDL Ratio: 4

## 2012-10-25 ENCOUNTER — Ambulatory Visit (INDEPENDENT_AMBULATORY_CARE_PROVIDER_SITE_OTHER): Payer: Medicare Other | Admitting: Internal Medicine

## 2012-10-25 ENCOUNTER — Encounter: Payer: Self-pay | Admitting: Internal Medicine

## 2012-10-25 VITALS — BP 110/70 | HR 71 | Temp 98.1°F | Ht 71.25 in | Wt 177.0 lb

## 2012-10-25 DIAGNOSIS — Z23 Encounter for immunization: Secondary | ICD-10-CM

## 2012-10-25 DIAGNOSIS — Z2911 Encounter for prophylactic immunotherapy for respiratory syncytial virus (RSV): Secondary | ICD-10-CM

## 2012-10-25 DIAGNOSIS — E785 Hyperlipidemia, unspecified: Secondary | ICD-10-CM

## 2012-10-25 DIAGNOSIS — Z0000 Encounter for general adult medical examination without abnormal findings: Secondary | ICD-10-CM

## 2012-10-25 DIAGNOSIS — F329 Major depressive disorder, single episode, unspecified: Secondary | ICD-10-CM

## 2012-10-25 DIAGNOSIS — N400 Enlarged prostate without lower urinary tract symptoms: Secondary | ICD-10-CM

## 2012-10-25 DIAGNOSIS — I10 Essential (primary) hypertension: Secondary | ICD-10-CM

## 2012-10-25 MED ORDER — TRIAMTERENE-HCTZ 37.5-25 MG PO CAPS: 1.0000 | ORAL_CAPSULE | ORAL | Status: DC

## 2012-10-25 MED ORDER — CLONAZEPAM 0.5 MG PO TABS: 0.5000 mg | ORAL_TABLET | Freq: Two times a day (BID) | ORAL | Status: DC | PRN

## 2012-10-25 MED ORDER — PRAVASTATIN SODIUM 40 MG PO TABS: 40.0000 mg | ORAL_TABLET | Freq: Every day | ORAL | Status: DC

## 2012-10-25 MED ORDER — BUPROPION HCL ER (XL) 300 MG PO TB24: 300.0000 mg | ORAL_TABLET | Freq: Every day | ORAL | Status: DC

## 2012-10-25 NOTE — Progress Notes (Signed)
Patient ID: Richard Sparks, male   DOB: 02-Jun-1945, 67 y.o.   MRN: [id number] Patient ID: Richard Sparks, male   DOB: 08/27/1945, 67 y.o.   MRN: [id number]  Subjective:    Patient ID: Richard Sparks, male    DOB: 1945/04/29, 67 y.o.   MRN: [id number]  Hypertension Pertinent negatives include no chest pain, headaches, neck pain, palpitations or shortness of breath.  67year-old patient who is seen today for an annual exam. His only concern today is some mild hoarseness that interferes with singing. This has been present for 3-4 weeks. He is concerned about a vocal cord nodule  1. Risk factors, based on past  M,S,F history- cardiovascular risk factors include hypertension and dyslipidemia. Family history of longevity  2.  Physical activities: No exercise limitations. Does go to his health club 3 times weekly  3.  Depression/mood: History depression well controlled with welbutrin  4.  Hearing: No deficits  5.  ADL's:  Independent in all aspects of daily living  6.  Fall risk:  Low  7.  Home safety:  No problems identified   8.  Height weight, and visual acuity; height and weight stable no change in visual acuity  9.  Counseling: Heart healthy diet regular exercise regimen encouraged  10. Lab orders based on risk factors: Laboratory profile including lipid panel reviewed  11. Referral : Not appropriate at this time  12. Care plan: Continue a regular exercise program heart healthy diet  13. Cognitive assessment: Alert and oriented with normal affect. No cognitive dysfunction    Allergies:  1) Amoxicillin (Amoxicillin)   Past History:  Past Medical History:   Depression  Hypertension  Benign prostatic hypertrophy  Hyperlipidemia  Skin cancer  Past Surgical History:   no prior surgeries  colonoscopy 2005   Family History:   father age 47, has a history of a thyroid disorder in type 2 diabetes  mother, age 61, is in good health  One brother 3 sisters, all remain in good  health   niece died at age 2 of a brain tumor. 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  HENT: Negative for hearing loss, ear pain, congestion, rhinorrhea, sneezing, mouth sores, trouble swallowing, neck pain, neck stiffness, dental problem, voice change, sinus pressure and tinnitus.   Eyes: Negative for photophobia, pain, redness and visual disturbance.  Respiratory: Negative for apnea, cough, choking, chest tightness, shortness of breath and wheezing.   Cardiovascular: Negative for chest pain, palpitations and leg swelling.  Gastrointestinal: Negative for nausea, vomiting, abdominal pain, diarrhea, constipation, blood in stool, abdominal distention, anal bleeding and rectal pain.  Genitourinary: Negative for dysuria, urgency, frequency, hematuria, flank pain, decreased urine volume, discharge, penile swelling, scrotal swelling, difficulty urinating, genital sores and testicular pain.  Musculoskeletal: Negative for myalgias, back pain, joint swelling, arthralgias and gait problem.  Skin: Negative for color change, rash and wound.  Neurological: Negative for dizziness, tremors, seizures, syncope, facial asymmetry, speech difficulty, weakness, light-headedness, numbness and headaches.  Hematological: Negative for adenopathy. Does not bruise/bleed easily.  Psychiatric/Behavioral: Negative for suicidal ideas, hallucinations, behavioral problems, confusion, sleep disturbance, self-injury, dysphoric mood, decreased concentration and agitation. The patient is not nervous/anxious.   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  HENT:  Head: Normocephalic and atraumatic.   Right Ear: External ear normal.  Left Ear: External ear normal.  Nose: Nose normal.  Mouth/Throat: Oropharynx is clear and moist.  Eyes: Conjunctivae and EOM are normal. Pupils are equal, round, and reactive to light. No scleral icterus.  Neck: Normal range of motion. Neck supple. No JVD present. No thyromegaly present.  Cardiovascular: Regular rhythm, normal heart sounds and intact distal pulses.  Exam reveals no gallop and no friction rub.   No murmur heard. Diminished right dorsalis pedis pulse  Pulmonary/Chest: Effort normal and breath sounds normal. He exhibits no tenderness.  Abdominal: Soft. Bowel sounds are normal. He exhibits no distension and no mass. There is no tenderness.  Genitourinary: Prostate normal and penis normal.  Musculoskeletal: Normal range of motion. He exhibits no edema and no tenderness.  Lymphadenopathy:    He has no cervical adenopathy.  Neurological: He is alert. He has normal reflexes. No cranial nerve deficit. Coordination normal.  Skin: Skin is warm and dry. No rash noted.  Psychiatric: He has a normal mood and affect. His behavior is normal.          Assessment & Plan:  Annual clinical exam  Hypertension controlled Dyslipidemia. Will continue  pravastatin to 40 mg daily Mild hoarseness of short duration. The patient will be observed for the next 3-4 weeks if symptoms worsen we'll set up for ENT referral  Recheck 1 year

## 2012-10-25 NOTE — Patient Instructions (Signed)
It is important that you exercise regularly, at least 20 minutes 3 to 4 times per week.  If you develop chest pain or shortness of breath seek  medical attention.  Please check your blood pressure on a regular basis.  If it is consistently greater than 150/90, please make an office appointment.  Return in 6 months for follow-up

## 2012-12-06 ENCOUNTER — Other Ambulatory Visit: Payer: Self-pay | Admitting: Internal Medicine

## 2013-01-10 ENCOUNTER — Other Ambulatory Visit: Payer: Self-pay

## 2013-04-26 ENCOUNTER — Encounter: Payer: Self-pay | Admitting: Internal Medicine

## 2013-04-26 ENCOUNTER — Ambulatory Visit (INDEPENDENT_AMBULATORY_CARE_PROVIDER_SITE_OTHER): Payer: Medicare Other | Admitting: Internal Medicine

## 2013-04-26 VITALS — BP 112/66 | HR 84 | Temp 97.4°F | Resp 20 | Ht 71.25 in | Wt 173.0 lb

## 2013-04-26 DIAGNOSIS — F329 Major depressive disorder, single episode, unspecified: Secondary | ICD-10-CM

## 2013-04-26 DIAGNOSIS — I10 Essential (primary) hypertension: Secondary | ICD-10-CM

## 2013-04-26 DIAGNOSIS — Z23 Encounter for immunization: Secondary | ICD-10-CM

## 2013-04-26 DIAGNOSIS — E785 Hyperlipidemia, unspecified: Secondary | ICD-10-CM

## 2013-04-26 DIAGNOSIS — F3289 Other specified depressive episodes: Secondary | ICD-10-CM

## 2013-04-26 NOTE — Progress Notes (Signed)
Subjective:    Patient ID: Richard Sparks, male    DOB: Jun 06, 1945, 68 y.o.   MRN: [id number]  HPI   68 year old patient who is seen today for his biannual followup.  Medical problems include hypertension, dyslipidemia, and a history of depression.  He is followed by psychiatry.  Quite well today without concerns or complaints.  Blood pressure is in a low normal range.  Past Medical History  Diagnosis Date  . BENIGN PROSTATIC HYPERTROPHY 08/01/2006  . DEPRESSION 08/01/2006  . HYPERLIPIDEMIA 08/01/2006  . HYPERTENSION 08/01/2006    History   Social History  . Marital Status: Married    Spouse Name: N/A    Number of Children: N/A  . Years of Education: N/A   Occupational History  . Not on file.   Social History Main Topics  . Smoking status: Never Smoker   . Smokeless tobacco: Never Used  . Alcohol Use: No  . Drug Use: No  . Sexual Activity: Not on file   Other Topics Concern  . Not on file   Social History Narrative  . No narrative on file    History reviewed. No pertinent past surgical history.  No family history on file.  Allergies  Allergen Reactions  . Amoxicillin     REACTION: unspecified    Current Outpatient Prescriptions on File Prior to Visit  Medication Sig Dispense Refill  . b complex vitamins tablet Take 1 tablet by mouth daily.      Marland Kitchen buPROPion (WELLBUTRIN XL) 300 MG 24 hr tablet Take 1 tablet (300 mg total) by mouth daily.  90 tablet  4  . Cholecalciferol (VITAMIN D) 1000 UNITS capsule Take 1,000 Units by mouth daily.        . clonazePAM (KLONOPIN) 0.5 MG tablet Take 1 tablet (0.5 mg total) by mouth 2 (two) times daily as needed for anxiety.  60 tablet  3  . Omega-3 Fatty Acids (FISH OIL) 1000 MG CAPS Take 3 capsules by mouth daily.        . pravastatin (PRAVACHOL) 40 MG tablet Take 1 tablet (40 mg total) by mouth daily.  90 tablet  4  . triamterene-hydrochlorothiazide (DYAZIDE) 37.5-25 MG per capsule Take 1 each (1 capsule total) by mouth every  morning.  90 capsule  6   No current facility-administered medications on file prior to visit.    BP 112/66  Pulse 84  Temp(Src) 97.4 F (36.3 C) (Oral)  Resp 20  Ht 5' 11.25" (1.81 m)  Wt 173 lb (78.472 kg)  BMI 23.95 kg/m2  SpO2 98%       Review of Systems  Constitutional: Negative for fever, chills, appetite change and fatigue.  HENT: Negative for congestion, dental problem, ear pain, hearing loss, sore throat, tinnitus, trouble swallowing and voice change.   Eyes: Negative for pain, discharge and visual disturbance.  Respiratory: Negative for cough, chest tightness, wheezing and stridor.   Cardiovascular: Negative for chest pain, palpitations and leg swelling.  Gastrointestinal: Negative for nausea, vomiting, abdominal pain, diarrhea, constipation, blood in stool and abdominal distention.  Genitourinary: Negative for urgency, hematuria, flank pain, discharge, difficulty urinating and genital sores.  Musculoskeletal: Negative for arthralgias, back pain, gait problem, joint swelling, myalgias and neck stiffness.  Skin: Negative for rash.  Neurological: Negative for dizziness, syncope, speech difficulty, weakness, numbness and headaches.  Hematological: Negative for adenopathy. Does not bruise/bleed easily.  Psychiatric/Behavioral: Negative for behavioral problems and dysphoric mood. The patient is not nervous/anxious.  Objective:   Physical Exam  Constitutional: He is oriented to person, place, and time. He appears well-developed.  Blood pressure 110/66  HENT:  Head: Normocephalic.  Right Ear: External ear normal.  Left Ear: External ear normal.  Eyes: Conjunctivae and EOM are normal.  Neck: Normal range of motion.  Cardiovascular: Normal rate and normal heart sounds.   Pulmonary/Chest: Breath sounds normal.  Abdominal: Bowel sounds are normal.  Musculoskeletal: Normal range of motion. He exhibits no edema and no tenderness.  Neurological: He is alert and  oriented to person, place, and time.  Psychiatric: He has a normal mood and affect. His behavior is normal.          Assessment & Plan:   Hypertension, excellent control Dyslipidemia.  Continue statin therapy Depression stable.  Followup psychiatry  CPX 6 months

## 2013-04-26 NOTE — Progress Notes (Signed)
Pre-visit discussion using our clinic review tool. No additional management support is needed unless otherwise documented below in the visit note.  

## 2013-04-26 NOTE — Patient Instructions (Signed)
Limit your sodium (Salt) intake    It is important that you exercise regularly, at least 20 minutes 3 to 4 times per week.  If you develop chest pain or shortness of breath seek  medical attention.  Please check your blood pressure on a regular basis.  If it is consistently greater than 150/90, please make an office appointment.  Return in 6 months for follow-up   

## 2013-04-29 ENCOUNTER — Telehealth: Payer: Self-pay | Admitting: Internal Medicine

## 2013-04-29 NOTE — Telephone Encounter (Signed)
Relevant patient education assigned to patient using Emmi. ° °

## 2013-09-06 ENCOUNTER — Other Ambulatory Visit: Payer: Self-pay | Admitting: Internal Medicine

## 2013-10-10 ENCOUNTER — Other Ambulatory Visit (INDEPENDENT_AMBULATORY_CARE_PROVIDER_SITE_OTHER): Payer: Medicare Other

## 2013-10-10 DIAGNOSIS — N400 Enlarged prostate without lower urinary tract symptoms: Secondary | ICD-10-CM

## 2013-10-10 DIAGNOSIS — E785 Hyperlipidemia, unspecified: Secondary | ICD-10-CM

## 2013-10-10 DIAGNOSIS — Z0000 Encounter for general adult medical examination without abnormal findings: Secondary | ICD-10-CM

## 2013-10-10 LAB — HEPATIC FUNCTION PANEL
ALK PHOS: 50 U/L (ref 39–117)
ALT: 21 U/L (ref 0–53)
AST: 21 U/L (ref 0–37)
Albumin: 3.8 g/dL (ref 3.5–5.2)
BILIRUBIN DIRECT: 0.2 mg/dL (ref 0.0–0.3)
BILIRUBIN TOTAL: 1 mg/dL (ref 0.2–1.2)
Total Protein: 6.3 g/dL (ref 6.0–8.3)

## 2013-10-10 LAB — CBC WITH DIFFERENTIAL/PLATELET
Basophils Absolute: 0 10*3/uL (ref 0.0–0.1)
Basophils Relative: 0.5 % (ref 0.0–3.0)
EOS ABS: 0.2 10*3/uL (ref 0.0–0.7)
EOS PCT: 3.2 % (ref 0.0–5.0)
HCT: 47.5 % (ref 39.0–52.0)
Hemoglobin: 16 g/dL (ref 13.0–17.0)
LYMPHS PCT: 26 % (ref 12.0–46.0)
Lymphs Abs: 1.9 10*3/uL (ref 0.7–4.0)
MCHC: 33.8 g/dL (ref 30.0–36.0)
MCV: 89.5 fl (ref 78.0–100.0)
MONO ABS: 0.6 10*3/uL (ref 0.1–1.0)
Monocytes Relative: 7.7 % (ref 3.0–12.0)
NEUTROS PCT: 62.6 % (ref 43.0–77.0)
Neutro Abs: 4.6 10*3/uL (ref 1.4–7.7)
PLATELETS: 217 10*3/uL (ref 150.0–400.0)
RBC: 5.3 Mil/uL (ref 4.22–5.81)
RDW: 13 % (ref 11.5–15.5)
WBC: 7.3 10*3/uL (ref 4.0–10.5)

## 2013-10-10 LAB — POCT URINALYSIS DIPSTICK
BILIRUBIN UA: NEGATIVE
Blood, UA: NEGATIVE
GLUCOSE UA: NEGATIVE
KETONES UA: NEGATIVE
LEUKOCYTES UA: NEGATIVE
Nitrite, UA: NEGATIVE
PROTEIN UA: NEGATIVE
Spec Grav, UA: 1.02
Urobilinogen, UA: 0.2
pH, UA: 5

## 2013-10-10 LAB — LIPID PANEL
Cholesterol: 171 mg/dL (ref 0–200)
HDL: 40.5 mg/dL (ref >39.00)
LDL Cholesterol: 96 mg/dL (ref 0–99)
NonHDL: 130.5
TRIGLYCERIDES: 172 mg/dL — AB (ref 0.0–149.0)
Total CHOL/HDL Ratio: 4
VLDL: 34.4 mg/dL (ref 0.0–40.0)

## 2013-10-10 LAB — TSH: TSH: 1.35 u[IU]/mL (ref 0.35–4.50)

## 2013-10-10 LAB — BASIC METABOLIC PANEL
BUN: 21 mg/dL (ref 6–23)
CALCIUM: 9.7 mg/dL (ref 8.4–10.5)
CHLORIDE: 106 meq/L (ref 96–112)
CO2: 30 mEq/L (ref 19–32)
CREATININE: 1.2 mg/dL (ref 0.4–1.5)
GFR: 65.42 mL/min (ref >60.00)
Glucose, Bld: 87 mg/dL (ref 70–99)
Potassium: 4.1 mEq/L (ref 3.5–5.1)
Sodium: 143 mEq/L (ref 135–145)

## 2013-10-10 LAB — PSA: PSA: 1.81 ng/mL (ref 0.10–4.00)

## 2013-10-17 ENCOUNTER — Encounter: Payer: Medicare Other | Admitting: Internal Medicine

## 2013-10-28 ENCOUNTER — Encounter: Payer: Self-pay | Admitting: Internal Medicine

## 2013-10-28 ENCOUNTER — Ambulatory Visit (INDEPENDENT_AMBULATORY_CARE_PROVIDER_SITE_OTHER): Payer: Medicare Other | Admitting: Internal Medicine

## 2013-10-28 VITALS — BP 120/74 | HR 75 | Temp 97.8°F | Resp 20 | Ht 70.5 in | Wt 168.0 lb

## 2013-10-28 DIAGNOSIS — Z0000 Encounter for general adult medical examination without abnormal findings: Secondary | ICD-10-CM

## 2013-10-28 DIAGNOSIS — F329 Major depressive disorder, single episode, unspecified: Secondary | ICD-10-CM

## 2013-10-28 DIAGNOSIS — N400 Enlarged prostate without lower urinary tract symptoms: Secondary | ICD-10-CM

## 2013-10-28 DIAGNOSIS — F3289 Other specified depressive episodes: Secondary | ICD-10-CM

## 2013-10-28 DIAGNOSIS — E785 Hyperlipidemia, unspecified: Secondary | ICD-10-CM

## 2013-10-28 DIAGNOSIS — I10 Essential (primary) hypertension: Secondary | ICD-10-CM

## 2013-10-28 NOTE — Progress Notes (Signed)
Pre visit review using our clinic review tool, if applicable. No additional management support is needed unless otherwise documented below in the visit note. 

## 2013-10-28 NOTE — Patient Instructions (Signed)
Limit your sodium (Salt) intake  Please check your blood pressure on a regular basis.  If it is consistently greater than 150/90, please make an office appointment.  Schedule your colonoscopy to help detect colon cancer.  Return in 6 months for follow-up  Health Maintenance A healthy lifestyle and preventative care can promote health and wellness.  Maintain regular health, dental, and eye exams.  Eat a healthy diet. Foods like vegetables, fruits, whole grains, low-fat dairy products, and lean protein foods contain the nutrients you need and are low in calories. Decrease your intake of foods high in solid fats, added sugars, and salt. Get information about a proper diet from your health care provider, if necessary.  Regular physical exercise is one of the most important things you can do for your health. Most adults should get at least 150 minutes of moderate-intensity exercise (any activity that increases your heart rate and causes you to sweat) each week. In addition, most adults need muscle-strengthening exercises on 2 or more days a week.   Maintain a healthy weight. The body mass index (BMI) is a screening tool to identify possible weight problems. It provides an estimate of body fat based on height and weight. Your health care provider can find your BMI and can help you achieve or maintain a healthy weight. For males 20 years and older:  A BMI below 18.5 is considered underweight.  A BMI of 18.5 to 24.9 is normal.  A BMI of 25 to 29.9 is considered overweight.  A BMI of 30 and above is considered obese.  Maintain normal blood lipids and cholesterol by exercising and minimizing your intake of saturated fat. Eat a balanced diet with plenty of fruits and vegetables. Blood tests for lipids and cholesterol should begin at age 51 and be repeated every 5 years. If your lipid or cholesterol levels are high, you are over age 35, or you are at high risk for heart disease, you may need your  cholesterol levels checked more frequently.Ongoing high lipid and cholesterol levels should be treated with medicines if diet and exercise are not working.  If you smoke, find out from your health care provider how to quit. If you do not use tobacco, do not start.  Lung cancer screening is recommended for adults aged 42-80 years who are at high risk for developing lung cancer because of a history of smoking. A yearly low-dose CT scan of the lungs is recommended for people who have at least a 30-pack-year history of smoking and are current smokers or have quit within the past 15 years. A pack year of smoking is smoking an average of 1 pack of cigarettes a day for 1 year (for example, a 30-pack-year history of smoking could mean smoking 1 pack a day for 30 years or 2 packs a day for 15 years). Yearly screening should continue until the smoker has stopped smoking for at least 15 years. Yearly screening should be stopped for people who develop a health problem that would prevent them from having lung cancer treatment.  If you choose to drink alcohol, do not have more than 2 drinks per day. One drink is considered to be 12 oz (360 mL) of beer, 5 oz (150 mL) of wine, or 1.5 oz (45 mL) of liquor.  Avoid the use of street drugs. Do not share needles with anyone. Ask for help if you need support or instructions about stopping the use of drugs.  High blood pressure causes heart disease and  increases the risk of stroke. Blood pressure should be checked at least every 1-2 years. Ongoing high blood pressure should be treated with medicines if weight loss and exercise are not effective.  If you are 40-42 years old, ask your health care provider if you should take aspirin to prevent heart disease.  Diabetes screening involves taking a blood sample to check your fasting blood sugar level. This should be done once every 3 years after age 17 if you are at a normal weight and without risk factors for diabetes. Testing  should be considered at a younger age or be carried out more frequently if you are overweight and have at least 1 risk factor for diabetes.  Colorectal cancer can be detected and often prevented. Most routine colorectal cancer screening begins at the age of 73 and continues through age 92. However, your health care provider may recommend screening at an earlier age if you have risk factors for colon cancer. On a yearly basis, your health care provider may provide home test kits to check for hidden blood in the stool. A small camera at the end of a tube may be used to directly examine the colon (sigmoidoscopy or colonoscopy) to detect the earliest forms of colorectal cancer. Talk to your health care provider about this at age 39 when routine screening begins. A direct exam of the colon should be repeated every 5-10 years through age 73, unless early forms of precancerous polyps or small growths are found.  People who are at an increased risk for hepatitis B should be screened for this virus. You are considered at high risk for hepatitis B if:  You were born in a country where hepatitis B occurs often. Talk with your health care provider about which countries are considered high risk.  Your parents were born in a high-risk country and you have not received a shot to protect against hepatitis B (hepatitis B vaccine).  You have HIV or AIDS.  You use needles to inject street drugs.  You live with, or have sex with, someone who has hepatitis B.  You are a man who has sex with other men (MSM).  You get hemodialysis treatment.  You take certain medicines for conditions like cancer, organ transplantation, and autoimmune conditions.  Hepatitis C blood testing is recommended for all people born from [birth year] through 1964 and any individual with known risk factors for hepatitis C.  Healthy men should no longer receive prostate-specific antigen (PSA) blood tests as part of routine cancer screening. Talk to  your health care provider about prostate cancer screening.  Testicular cancer screening is not recommended for adolescents or adult males who have no symptoms. Screening includes self-exam, a health care provider exam, and other screening tests. Consult with your health care provider about any symptoms you have or any concerns you have about testicular cancer.  Practice safe sex. Use condoms and avoid high-risk sexual practices to reduce the spread of sexually transmitted infections (STIs).  You should be screened for STIs, including gonorrhea and chlamydia if:  You are sexually active and are younger than 24 years.  You are older than 24 years, and your health care provider tells you that you are at risk for this type of infection.  Your sexual activity has changed since you were last screened, and you are at an increased risk for chlamydia or gonorrhea. Ask your health care provider if you are at risk.  If you are at risk of being infected with HIV,  it is recommended that you take a prescription medicine daily to prevent HIV infection. This is called pre-exposure prophylaxis (PrEP). You are considered at risk if:  You are a man who has sex with other men (MSM).  You are a heterosexual man who is sexually active with multiple partners.  You take drugs by injection.  You are sexually active with a partner who has HIV.  Talk with your health care provider about whether you are at high risk of being infected with HIV. If you choose to begin PrEP, you should first be tested for HIV. You should then be tested every 3 months for as long as you are taking PrEP.  Use sunscreen. Apply sunscreen liberally and repeatedly throughout the day. You should seek shade when your shadow is shorter than you. Protect yourself by wearing long sleeves, pants, a wide-brimmed hat, and sunglasses year round whenever you are outdoors.  Tell your health care provider of new moles or changes in moles, especially if  there is a change in shape or color. Also, tell your health care provider if a mole is larger than the size of a pencil eraser.  A one-time screening for abdominal aortic aneurysm (AAA) and surgical repair of large AAAs by ultrasound is recommended for men aged 48-75 years who are current or former smokers.  Stay current with your vaccines (immunizations). Document Released: 08/20/2007 Document Revised: 02/26/2013 Document Reviewed: 07/19/2010 [facility] Patient Information 2015 [place], [place]. This information is not intended to replace advice given to you by your health care provider. Make sure you discuss any questions you have with your health care provider.

## 2013-10-28 NOTE — Progress Notes (Signed)
Patient ID: Richard Sparks, male   DOB: [birth year], 68 y.o.   MRN: [id number] Patient ID: Richard Sparks, male   DOB: 1945-06-12, 68 y.o.   MRN: [id number]  Subjective:    Patient ID: Richard Sparks, male    DOB: [birth year], 68 y.o.   MRN: [id number]  Hypertension Pertinent negatives include no chest pain, headaches, neck pain, palpitations or shortness of breath.    68 year-old patient who is seen today for an annual exam.   1. Risk factors, based on past  M,S,F history- cardiovascular risk factors include hypertension and dyslipidemia. Family history of longevity  2.  Physical activities: No exercise limitations. Does go to his health club 3 times weekly  3.  Depression/mood: History depression well controlled with welbutrin  4.  Hearing: No deficits  5.  ADL's:  Independent in all aspects of daily living  6.  Fall risk:  Low  7.  Home safety:  No problems identified   8.  Height weight, and visual acuity; height and weight stable no change in visual acuity  9.  Counseling: Heart healthy diet regular exercise regimen encouraged  10. Lab orders based on risk factors: Laboratory profile including lipid panel reviewed  11. Referral : Not appropriate at this time  12. Care plan: Continue a regular exercise program heart healthy diet  13. Cognitive assessment: Alert and oriented with normal affect. No cognitive dysfunction    Allergies:  1) Amoxicillin (Amoxicillin)   Past History:  Past Medical History:   Depression  Hypertension  Benign prostatic hypertrophy  Hyperlipidemia  Skin cancer  Past Surgical History:   no prior surgeries  colonoscopy 2005   Family History:   father age 77, has a history of a thyroid disorder in type 2 diabetes  mother, age 62, is in good health  One brother 3 sisters, all remain in good health   niece died at age 56 of a brain tumor. Otherwise no family history of cancer   Social History:   Married  formally employed with the [employer].  school system  Regular exercise-yes  Does Patient Exercise: yes      Review of Systems  Constitutional: Negative for fever, chills, activity change, appetite change and fatigue.  HENT: Negative for congestion, dental problem, ear pain, hearing loss, mouth sores, rhinorrhea, sinus pressure, sneezing, tinnitus, trouble swallowing and voice change.   Eyes: Negative for photophobia, pain, redness and visual disturbance.  Respiratory: Negative for apnea, cough, choking, chest tightness, shortness of breath and wheezing.   Cardiovascular: Negative for chest pain, palpitations and leg swelling.  Gastrointestinal: Negative for nausea, vomiting, abdominal pain, diarrhea, constipation, blood in stool, abdominal distention, anal bleeding and rectal pain.  Genitourinary: Negative for dysuria, urgency, frequency, hematuria, flank pain, decreased urine volume, discharge, penile swelling, scrotal swelling, difficulty urinating, genital sores and testicular pain.  Musculoskeletal: Negative for arthralgias, back pain, gait problem, joint swelling, myalgias, neck pain and neck stiffness.  Skin: Negative for color change, rash and wound.  Neurological: Negative for dizziness, tremors, seizures, syncope, facial asymmetry, speech difficulty, weakness, light-headedness, numbness and headaches.  Hematological: Negative for adenopathy. Does not bruise/bleed easily.  Psychiatric/Behavioral: Negative for suicidal ideas, hallucinations, behavioral problems, confusion, sleep disturbance, self-injury, dysphoric mood, decreased concentration and agitation. The patient is not nervous/anxious.        Objective:   Physical Exam  Constitutional: He appears well-developed and well-nourished.  HENT:  Head: Normocephalic and atraumatic.  Right Ear: External ear normal.  Left  Ear: External ear normal.  Nose: Nose normal.  Mouth/Throat: Oropharynx is clear and moist.  Eyes: Conjunctivae and EOM are normal. Pupils are  equal, round, and reactive to light. No scleral icterus.  Neck: Normal range of motion. Neck supple. No JVD present. No thyromegaly present.  Cardiovascular: Regular rhythm, normal heart sounds and intact distal pulses.  Exam reveals no gallop and no friction rub.   No murmur heard. Pulmonary/Chest: Effort normal and breath sounds normal. He exhibits no tenderness.  Abdominal: Soft. Bowel sounds are normal. He exhibits no distension and no mass. There is no tenderness.  Genitourinary: Prostate normal and penis normal. Guaiac positive stool.  Prostate plus 2 enlarged  Musculoskeletal: Normal range of motion. He exhibits no edema and no tenderness.  Lymphadenopathy:    He has no cervical adenopathy.  Neurological: He is alert. He has normal reflexes. No cranial nerve deficit. Coordination normal.  Skin: Skin is warm and dry. No rash noted.  Psychiatric: He has a normal mood and affect. His behavior is normal.          Assessment & Plan:  Annual clinical exam  Hypertension controlled Dyslipidemia. Will continue  pravastatin to 40 mg daily Heme positive stool.  Needs followup colonoscopy  Recheck 6 months Low-salt diet Home blood pressure monitor and encouraged   Recheck 1 year

## 2013-10-31 ENCOUNTER — Encounter: Payer: Self-pay | Admitting: Internal Medicine

## 2013-12-01 ENCOUNTER — Other Ambulatory Visit: Payer: Self-pay | Admitting: Internal Medicine

## 2013-12-17 ENCOUNTER — Ambulatory Visit (AMBULATORY_SURGERY_CENTER): Payer: Self-pay | Admitting: *Deleted

## 2013-12-17 VITALS — Ht 70.5 in | Wt 166.6 lb

## 2013-12-17 DIAGNOSIS — R195 Other fecal abnormalities: Secondary | ICD-10-CM

## 2013-12-17 NOTE — Progress Notes (Signed)
No allergies to eggs or soy. No problems with anesthesia.  Pt given Emmi instructions for colonoscopy  No oxygen use  No diet drug use  

## 2013-12-26 ENCOUNTER — Encounter: Payer: Self-pay | Admitting: Internal Medicine

## 2013-12-31 ENCOUNTER — Encounter: Payer: Self-pay | Admitting: Internal Medicine

## 2013-12-31 ENCOUNTER — Ambulatory Visit (AMBULATORY_SURGERY_CENTER): Payer: Medicare Other | Admitting: Internal Medicine

## 2013-12-31 VITALS — BP 128/73 | HR 53 | Temp 96.4°F | Resp 17 | Ht 70.5 in | Wt 166.0 lb

## 2013-12-31 DIAGNOSIS — R195 Other fecal abnormalities: Secondary | ICD-10-CM

## 2013-12-31 DIAGNOSIS — D122 Benign neoplasm of ascending colon: Secondary | ICD-10-CM

## 2013-12-31 DIAGNOSIS — Z1211 Encounter for screening for malignant neoplasm of colon: Secondary | ICD-10-CM

## 2013-12-31 MED ORDER — SODIUM CHLORIDE 0.9 % IV SOLN: 500.0000 mL | INTRAVENOUS | Status: DC

## 2013-12-31 NOTE — Progress Notes (Signed)
Called to room to assist during endoscopic procedure.  Patient ID and intended procedure confirmed with present staff. Received instructions for my participation in the procedure from the performing physician.  

## 2013-12-31 NOTE — Progress Notes (Signed)
Report to PACU, RN, vss, BBS= Clear.  

## 2013-12-31 NOTE — Op Note (Signed)
[place]  [address]. [place], [address]   COLONOSCOPY PROCEDURE REPORT  PATIENT: Richard Sparks, Richard Sparks  MR#: [id number] BIRTHDATE: 05-13-1945 , 89  yrs. old GENDER: male ENDOSCOPIST: Eustace Quail, MD REFERRED [id number] Recall, M.D. PROCEDURE DATE:  12/31/2013 PROCEDURE:   Colonoscopy with snare polypectomy x 2 First Screening Colonoscopy - Avg.  risk and is 50 yrs.  old or older - No.  Prior Negative Screening - Now for repeat screening. 10 or more years since last screening  History of Adenoma - Now for follow-up colonoscopy & has been > or = to 3 yrs.  N/A  Polyps Removed Today? Yes. ASA CLASS:   Class II INDICATIONS:average risk for colorectal cancer.   Negative index exam 02-2004 (DRP) MEDICATIONS: Monitored anesthesia care and Propofol 220 mg IV  DESCRIPTION OF PROCEDURE:   After the risks benefits and alternatives of the procedure were thoroughly explained, informed consent was obtained.  The digital rectal exam revealed no abnormalities of the rectum.   The [id number]  endoscope was introduced through the anus and advanced to the cecum, which was identified by both the appendix and ileocecal valve. No adverse events experienced.   The quality of the prep was excellent, using MoviPrep  The instrument was then slowly withdrawn as the colon was fully examined.     COLON FINDINGS: Two polyps measuring 3 mm in size were found in the ascending colon.  A polypectomy was performed with a cold snare. The resection was complete, the polyp tissue was completely retrieved and sent to histology.   The examination was otherwise normal.  Retroflexed views revealed internal hemorrhoids. The time to cecum=5 minutes 24 seconds.  Withdrawal time=11 minutes 36 seconds.  The scope was withdrawn and the procedure completed.  COMPLICATIONS: There were no immediate complications.  ENDOSCOPIC IMPRESSION: 1.   Two polyps measuring 3 mm in size were found in  the ascending colon; polypectomy was performed with a cold snare 2.   The examination was otherwise normal  RECOMMENDATIONS: 1. Repeat colonoscopy in 5 years if polyp adenomatous; otherwise 10 years  eSigned:  Eustace Quail, MD 12/31/2013 10:26 AM   cc: Marletta Lor, MD and The Patient

## 2014-01-01 ENCOUNTER — Telehealth: Payer: Self-pay | Admitting: *Deleted

## 2014-01-01 NOTE — Telephone Encounter (Signed)
  Follow up Call-  Call back number 12/31/2013  Post procedure Call Back phone  # [phone]  Permission to leave phone message Yes     Patient questions:  Do you have a fever, pain , or abdominal swelling? No. Pain Score  0 *  Have you tolerated food without any problems? Yes.    Have you been able to return to your normal activities? Yes.    Do you have any questions about your discharge instructions: Diet   No. Medications  No. Follow up visit  No.  Do you have questions or concerns about your Care? No.  Actions: * If pain score is 4 or above: No action needed, pain <4.

## 2014-01-07 ENCOUNTER — Encounter: Payer: Self-pay | Admitting: Internal Medicine

## 2014-03-12 ENCOUNTER — Other Ambulatory Visit: Payer: Self-pay | Admitting: Internal Medicine

## 2014-04-30 ENCOUNTER — Encounter: Payer: Self-pay | Admitting: Internal Medicine

## 2014-04-30 ENCOUNTER — Ambulatory Visit (INDEPENDENT_AMBULATORY_CARE_PROVIDER_SITE_OTHER): Payer: Medicare Other | Admitting: Internal Medicine

## 2014-04-30 VITALS — BP 130/76 | HR 72 | Temp 97.8°F | Resp 20 | Ht 70.5 in | Wt 172.0 lb

## 2014-04-30 DIAGNOSIS — I10 Essential (primary) hypertension: Secondary | ICD-10-CM

## 2014-04-30 DIAGNOSIS — E785 Hyperlipidemia, unspecified: Secondary | ICD-10-CM

## 2014-04-30 MED ORDER — PRAVASTATIN SODIUM 40 MG PO TABS: 40.0000 mg | ORAL_TABLET | Freq: Every day | ORAL | Status: DC

## 2014-04-30 MED ORDER — TRIAMTERENE-HCTZ 37.5-25 MG PO TABS: 1.0000 | ORAL_TABLET | Freq: Every morning | ORAL | Status: DC

## 2014-04-30 NOTE — Progress Notes (Signed)
Subjective:    Patient ID: Richard Sparks, male    DOB: 11-18-1945, 69 y.o.   MRN: [id number]  HPI  69 year old patient who is seen today for his biannual follow-up He has treated hypertension and dyslipidemia.  Remains on diuretic therapy and statin therapy, which she continues to tolerate He has a history depression and has been followed by psychiatry.  He is in the process of tapering Wellbutrin.  He is doing quite well. His only complaint today is occasional paroxysmal right knee and hip pain.  He describes very sharp and fleeting pain.  This seems a more paroxysmal been related to any particular activity.  Remains quite active with walking and a gym membership.  No cardiopulmonary complaints  Past Medical History  Diagnosis Date  . BENIGN PROSTATIC HYPERTROPHY 08/01/2006  . DEPRESSION 08/01/2006  . HYPERLIPIDEMIA 08/01/2006  . HYPERTENSION 08/01/2006  . Allergy     History   Social History  . Marital Status: Married    Spouse Name: N/A  . Number of Children: N/A  . Years of Education: N/A   Occupational History  . Not on file.   Social History Main Topics  . Smoking status: Never Smoker   . Smokeless tobacco: Never Used  . Alcohol Use: No  . Drug Use: No  . Sexual Activity: Not on file   Other Topics Concern  . Not on file   Social History Narrative    Past Surgical History  Procedure Laterality Date  . No past surgeries    . Colonoscopy      Family History  Problem Relation Age of Onset  . Colon cancer Neg Hx   . Esophageal cancer Neg Hx   . Rectal cancer Neg Hx   . Stomach cancer Neg Hx     Allergies  Allergen Reactions  . Amoxicillin Rash    Current Outpatient Prescriptions on File Prior to Visit  Medication Sig Dispense Refill  . b complex vitamins tablet Take 1 tablet by mouth daily.    . Cholecalciferol (VITAMIN D) 1000 UNITS capsule Take 1,000 Units by mouth daily.      . clonazePAM (KLONOPIN) 0.5 MG tablet Take 0.5 mg by mouth at bedtime.     Marland Kitchen ibuprofen (ADVIL,MOTRIN) 200 MG tablet Take 200 mg by mouth every 6 (six) hours as needed.    . naproxen sodium (ANAPROX) 220 MG tablet Take 220 mg by mouth 2 (two) times daily with a meal.    . Omega-3 Fatty Acids (FISH OIL) 1000 MG CAPS Take 3 capsules by mouth daily.      Marland Kitchen oxymetazoline (AFRIN) 0.05 % nasal spray Place 1 spray into both nostrils 2 (two) times daily.    . pravastatin (PRAVACHOL) 40 MG tablet TAKE ONE TABLET BY MOUTH ONE TIME DAILY  90 tablet 1  . triamterene-hydrochlorothiazide (MAXZIDE-25) 37.5-25 MG per tablet TAKE ONE TABLET BY MOUTH EVERY MORNING  90 tablet 0   No current facility-administered medications on file prior to visit.    BP 130/76 mmHg  Pulse 72  Temp(Src) 97.8 F (36.6 C) (Oral)  Resp 20  Ht 5' 10.5" (1.791 m)  Wt 172 lb (78.019 kg)  BMI 24.32 kg/m2  SpO2 98%     Review of Systems  Constitutional: Negative for fever, chills, appetite change and fatigue.  HENT: Negative for congestion, dental problem, ear pain, hearing loss, sore throat, tinnitus, trouble swallowing and voice change.   Eyes: Negative for pain, discharge and visual disturbance.  Respiratory:  Negative for cough, chest tightness, wheezing and stridor.   Cardiovascular: Negative for chest pain, palpitations and leg swelling.  Gastrointestinal: Negative for nausea, vomiting, abdominal pain, diarrhea, constipation, blood in stool and abdominal distention.  Genitourinary: Negative for urgency, hematuria, flank pain, discharge, difficulty urinating and genital sores.  Musculoskeletal: Positive for arthralgias. Negative for myalgias, back pain, joint swelling, gait problem and neck stiffness.  Skin: Negative for rash.  Neurological: Negative for dizziness, syncope, speech difficulty, weakness, numbness and headaches.  Hematological: Negative for adenopathy. Does not bruise/bleed easily.  Psychiatric/Behavioral: Positive for dysphoric mood. Negative for behavioral problems. The patient  is not nervous/anxious.        Objective:   Physical Exam  Constitutional: He is oriented to person, place, and time. He appears well-developed.  Blood pressure 120/70  HENT:  Head: Normocephalic.  Right Ear: External ear normal.  Left Ear: External ear normal.  Eyes: Conjunctivae and EOM are normal.  Neck: Normal range of motion.  Cardiovascular: Normal rate, normal heart sounds and intact distal pulses.   Pulmonary/Chest: Breath sounds normal.  Abdominal: Bowel sounds are normal.  Musculoskeletal: Normal range of motion. He exhibits no edema or tenderness.  Neurological: He is alert and oriented to person, place, and time.  Psychiatric: He has a normal mood and affect. His behavior is normal.          Assessment & Plan:   Hypertension, well-controlled Dyslipidemia.  Continue statin therapy.  Check lipid profile in 6 months Depression, stable.  Follow-up psychiatry  CPX 6 months

## 2014-04-30 NOTE — Patient Instructions (Signed)
Limit your sodium (Salt) intake    It is important that you exercise regularly, at least 20 minutes 3 to 4 times per week.  If you develop chest pain or shortness of breath seek  medical attention.  Return in 6 months for follow-up  

## 2014-09-11 ENCOUNTER — Other Ambulatory Visit: Payer: Self-pay | Admitting: Internal Medicine

## 2014-10-29 ENCOUNTER — Other Ambulatory Visit (INDEPENDENT_AMBULATORY_CARE_PROVIDER_SITE_OTHER): Payer: Medicare Other

## 2014-10-29 DIAGNOSIS — E785 Hyperlipidemia, unspecified: Secondary | ICD-10-CM

## 2014-10-29 DIAGNOSIS — I10 Essential (primary) hypertension: Secondary | ICD-10-CM

## 2014-10-29 DIAGNOSIS — Z0000 Encounter for general adult medical examination without abnormal findings: Secondary | ICD-10-CM

## 2014-10-29 DIAGNOSIS — N400 Enlarged prostate without lower urinary tract symptoms: Secondary | ICD-10-CM

## 2014-10-29 LAB — BASIC METABOLIC PANEL
BUN: 21 mg/dL (ref 6–23)
CALCIUM: 9.8 mg/dL (ref 8.4–10.5)
CO2: 33 mEq/L — ABNORMAL HIGH (ref 19–32)
Chloride: 104 mEq/L (ref 96–112)
Creatinine, Ser: 1.22 mg/dL (ref 0.40–1.50)
GFR: 62.75 mL/min (ref >60.00)
Glucose, Bld: 97 mg/dL (ref 70–99)
Potassium: 4.2 mEq/L (ref 3.5–5.1)
SODIUM: 145 meq/L (ref 135–145)

## 2014-10-29 LAB — CBC WITH DIFFERENTIAL/PLATELET
BASOS ABS: 0 10*3/uL (ref 0.0–0.1)
Basophils Relative: 0.6 % (ref 0.0–3.0)
Eosinophils Absolute: 0.2 10*3/uL (ref 0.0–0.7)
Eosinophils Relative: 3.3 % (ref 0.0–5.0)
HCT: 49 % (ref 39.0–52.0)
Hemoglobin: 16.7 g/dL (ref 13.0–17.0)
LYMPHS PCT: 29.7 % (ref 12.0–46.0)
Lymphs Abs: 2.2 10*3/uL (ref 0.7–4.0)
MCHC: 34 g/dL (ref 30.0–36.0)
MCV: 89.4 fl (ref 78.0–100.0)
MONOS PCT: 7.6 % (ref 3.0–12.0)
Monocytes Absolute: 0.6 10*3/uL (ref 0.1–1.0)
NEUTROS PCT: 58.8 % (ref 43.0–77.0)
Neutro Abs: 4.4 10*3/uL (ref 1.4–7.7)
Platelets: 229 10*3/uL (ref 150.0–400.0)
RBC: 5.49 Mil/uL (ref 4.22–5.81)
RDW: 13.1 % (ref 11.5–15.5)
WBC: 7.4 10*3/uL (ref 4.0–10.5)

## 2014-10-29 LAB — POCT URINALYSIS DIPSTICK
Bilirubin, UA: NEGATIVE
GLUCOSE UA: NEGATIVE
KETONES UA: NEGATIVE
Leukocytes, UA: NEGATIVE
Nitrite, UA: NEGATIVE
Protein, UA: NEGATIVE
RBC UA: NEGATIVE
UROBILINOGEN UA: 0.2
pH, UA: 5.5

## 2014-10-29 LAB — LIPID PANEL
CHOL/HDL RATIO: 5
CHOLESTEROL: 183 mg/dL (ref 0–200)
HDL: 37.5 mg/dL — ABNORMAL LOW (ref >39.00)
NONHDL: 145.65
Triglycerides: 235 mg/dL — ABNORMAL HIGH (ref 0.0–149.0)
VLDL: 47 mg/dL — ABNORMAL HIGH (ref 0.0–40.0)

## 2014-10-29 LAB — HEPATIC FUNCTION PANEL
ALK PHOS: 53 U/L (ref 39–117)
ALT: 18 U/L (ref 0–53)
AST: 15 U/L (ref 0–37)
Albumin: 4.1 g/dL (ref 3.5–5.2)
Bilirubin, Direct: 0.1 mg/dL (ref 0.0–0.3)
Total Bilirubin: 0.6 mg/dL (ref 0.2–1.2)
Total Protein: 6.3 g/dL (ref 6.0–8.3)

## 2014-10-29 LAB — PSA: PSA: 1.53 ng/mL (ref 0.10–4.00)

## 2014-10-29 LAB — LDL CHOLESTEROL, DIRECT: LDL DIRECT: 105 mg/dL

## 2014-10-29 LAB — TSH: TSH: 1.53 u[IU]/mL (ref 0.35–4.50)

## 2014-11-05 ENCOUNTER — Encounter: Payer: Self-pay | Admitting: Internal Medicine

## 2014-11-05 ENCOUNTER — Ambulatory Visit (INDEPENDENT_AMBULATORY_CARE_PROVIDER_SITE_OTHER): Payer: Medicare Other | Admitting: Internal Medicine

## 2014-11-05 VITALS — BP 140/82 | HR 64 | Temp 97.7°F | Resp 18 | Ht 70.0 in | Wt 179.0 lb

## 2014-11-05 DIAGNOSIS — Z86010 Personal history of colonic polyps: Secondary | ICD-10-CM | POA: Diagnosis not present

## 2014-11-05 DIAGNOSIS — Z23 Encounter for immunization: Secondary | ICD-10-CM | POA: Diagnosis not present

## 2014-11-05 DIAGNOSIS — I10 Essential (primary) hypertension: Secondary | ICD-10-CM | POA: Diagnosis not present

## 2014-11-05 DIAGNOSIS — Z0000 Encounter for general adult medical examination without abnormal findings: Secondary | ICD-10-CM

## 2014-11-05 DIAGNOSIS — E785 Hyperlipidemia, unspecified: Secondary | ICD-10-CM

## 2014-11-05 NOTE — Progress Notes (Signed)
Pre visit review using our clinic review tool, if applicable. No additional management support is needed unless otherwise documented below in the visit note. 

## 2014-11-05 NOTE — Progress Notes (Signed)
Patient ID: Richard Sparks, male   DOB: [birth year], 69 y.o.   MRN: [id number] Patient ID: Richard Sparks, male   DOB: 26-Dec-1945, 69 y.o.   MRN: [id number]  Subjective:    Patient ID: Richard Sparks, male    DOB: 1946-02-12, 69 y.o.   MRN: [id number]  Hypertension Pertinent negatives include no chest pain, headaches, neck pain, palpitations or shortness of breath.    69  year-old patient who is seen today for an annual exam.  Patient did have a follow-up colonoscopy 10 months ago that did reveal a tubular adenoma.  No concerns or complaints.   1. Risk factors, based on past  M,S,F history- cardiovascular risk factors include hypertension and dyslipidemia. Family history of longevity  2.  Physical activities: No exercise limitations. Does go to his health club 3 times weekly  3.  Depression/mood: History depression well controlled with welbutrin.  Followed by psychiatry  4.  Hearing: No deficits  5.  ADL's:  Independent in all aspects of daily living  6.  Fall risk:  Low  7.  Home safety:  No problems identified   8.  Height weight, and visual acuity; height and weight stable no change in visual acuity  9.  Counseling: Heart healthy diet regular exercise regimen encouraged  10. Lab orders based on risk factors: Laboratory profile including lipid panel reviewed  11. Referral : Not appropriate at this time  12. Care plan: Continue a regular exercise program heart healthy diet  13. Cognitive assessment: Alert and oriented with normal affect. No cognitive dysfunction  14.  Preventive services will include annual clinical exams with screening lab as well as yearly eye examinations.  Due to his history of colonic polyps.  He will have 5 year colonoscopies at five-year intervals.  15.  Provider list update includes primary care GI ophthalmology as well as psychiatry    Allergies:  1) Amoxicillin (Amoxicillin)   Past History:  Past Medical History:   Depression  Hypertension   Benign prostatic hypertrophy  Hyperlipidemia  Skin cancer  Past Surgical History:   no prior surgeries  colonoscopy 2005 2015  Family History:   father age 46, has a history of a thyroid disorder in type 2 diabetes  mother, age 35, is in good health  One brother 3 sisters, all remain in good health   niece died at age 86 of a brain tumor. 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  HENT: Negative for congestion, dental problem, ear pain, hearing loss, mouth sores, rhinorrhea, sinus pressure, sneezing, tinnitus, trouble swallowing and voice change.   Eyes: Negative for photophobia, pain, redness and visual disturbance.  Respiratory: Negative for apnea, cough, choking, chest tightness, shortness of breath and wheezing.   Cardiovascular: Negative for chest pain, palpitations and leg swelling.  Gastrointestinal: Negative for nausea, vomiting, abdominal pain, diarrhea, constipation, blood in stool, abdominal distention, anal bleeding and rectal pain.  Genitourinary: Negative for dysuria, urgency, frequency, hematuria, flank pain, decreased urine volume, discharge, penile swelling, scrotal swelling, difficulty urinating, genital sores and testicular pain.  Musculoskeletal: Negative for myalgias, back pain, joint swelling, arthralgias, gait problem, neck pain and neck stiffness.  Skin: Negative for color change, rash and wound.  Neurological: Negative for dizziness, tremors,  seizures, syncope, facial asymmetry, speech difficulty, weakness, light-headedness, numbness and headaches.  Hematological: Negative for adenopathy. Does not bruise/bleed easily.  Psychiatric/Behavioral: Negative for suicidal ideas, hallucinations, behavioral problems, confusion,  sleep disturbance, self-injury, dysphoric mood, decreased concentration and agitation. The patient is not nervous/anxious.        Objective:   Physical Exam  Constitutional: He appears well-developed and well-nourished.  HENT:  Head: Normocephalic and atraumatic.  Right Ear: External ear normal.  Left Ear: External ear normal.  Nose: Nose normal.  Mouth/Throat: Oropharynx is clear and moist.  Eyes: Conjunctivae and EOM are normal. Pupils are equal, round, and reactive to light. No scleral icterus.  Neck: Normal range of motion. Neck supple. No JVD present. No thyromegaly present.  Cardiovascular: Regular rhythm, normal heart sounds and intact distal pulses.  Exam reveals no gallop and no friction rub.   No murmur heard. Pulmonary/Chest: Effort normal and breath sounds normal. He exhibits no tenderness.  Abdominal: Soft. Bowel sounds are normal. He exhibits no distension and no mass. There is no tenderness.  Genitourinary: Prostate normal and penis normal. Guaiac negative stool.  Prostate plus 2 enlarged  Musculoskeletal: Normal range of motion. He exhibits no edema or tenderness.  Lymphadenopathy:    He has no cervical adenopathy.  Neurological: He is alert. He has normal reflexes. No cranial nerve deficit. Coordination normal.  Skin: Skin is warm and dry. No rash noted.  Psychiatric: He has a normal mood and affect. His behavior is normal.          Assessment & Plan:  Annual clinical exam  Hypertension controlled Dyslipidemia. Will continue  pravastatin to 40 mg daily  Low-salt diet Home blood pressure monitor and encouraged   Recheck 1 year

## 2014-11-05 NOTE — Patient Instructions (Signed)
Limit your sodium (Salt) intake  Please check your blood pressure on a regular basis.  If it is consistently greater than 150/90, please make an office appointment.    It is important that you exercise regularly, at least 20 minutes 3 to 4 times per week.  If you develop chest pain or shortness of breath seek  medical attention.  Health Maintenance A healthy lifestyle and preventative care can promote health and wellness.  Maintain regular health, dental, and eye exams.  Eat a healthy diet. Foods like vegetables, fruits, whole grains, low-fat dairy products, and lean protein foods contain the nutrients you need and are low in calories. Decrease your intake of foods high in solid fats, added sugars, and salt. Get information about a proper diet from your health care provider, if necessary.  Regular physical exercise is one of the most important things you can do for your health. Most adults should get at least 150 minutes of moderate-intensity exercise (any activity that increases your heart rate and causes you to sweat) each week. In addition, most adults need muscle-strengthening exercises on 2 or more days a week.   Maintain a healthy weight. The body mass index (BMI) is a screening tool to identify possible weight problems. It provides an estimate of body fat based on height and weight. Your health care provider can find your BMI and can help you achieve or maintain a healthy weight. For males 20 years and older:  A BMI below 18.5 is considered underweight.  A BMI of 18.5 to 24.9 is normal.  A BMI of 25 to 29.9 is considered overweight.  A BMI of 30 and above is considered obese.  Maintain normal blood lipids and cholesterol by exercising and minimizing your intake of saturated fat. Eat a balanced diet with plenty of fruits and vegetables. Blood tests for lipids and cholesterol should begin at age 33 and be repeated every 5 years. If your lipid or cholesterol levels are high, you are  over age 1, or you are at high risk for heart disease, you may need your cholesterol levels checked more frequently.Ongoing high lipid and cholesterol levels should be treated with medicines if diet and exercise are not working.  If you smoke, find out from your health care provider how to quit. If you do not use tobacco, do not start.  Lung cancer screening is recommended for adults aged 27-80 years who are at high risk for developing lung cancer because of a history of smoking. A yearly low-dose CT scan of the lungs is recommended for people who have at least a 30-pack-year history of smoking and are current smokers or have quit within the past 15 years. A pack year of smoking is smoking an average of 1 pack of cigarettes a day for 1 year (for example, a 30-pack-year history of smoking could mean smoking 1 pack a day for 30 years or 2 packs a day for 15 years). Yearly screening should continue until the smoker has stopped smoking for at least 15 years. Yearly screening should be stopped for people who develop a health problem that would prevent them from having lung cancer treatment.  If you choose to drink alcohol, do not have more than 2 drinks per day. One drink is considered to be 12 oz (360 mL) of beer, 5 oz (150 mL) of wine, or 1.5 oz (45 mL) of liquor.  Avoid the use of street drugs. Do not share needles with anyone. Ask for help if you  need support or instructions about stopping the use of drugs.  High blood pressure causes heart disease and increases the risk of stroke. Blood pressure should be checked at least every 1-2 years. Ongoing high blood pressure should be treated with medicines if weight loss and exercise are not effective.  If you are 71-23 years old, ask your health care provider if you should take aspirin to prevent heart disease.  Diabetes screening involves taking a blood sample to check your fasting blood sugar level. This should be done once every 3 years after age 68 if  you are at a normal weight and without risk factors for diabetes. Testing should be considered at a younger age or be carried out more frequently if you are overweight and have at least 1 risk factor for diabetes.  Colorectal cancer can be detected and often prevented. Most routine colorectal cancer screening begins at the age of 35 and continues through age 66. However, your health care provider may recommend screening at an earlier age if you have risk factors for colon cancer. On a yearly basis, your health care provider may provide home test kits to check for hidden blood in the stool. A small camera at the end of a tube may be used to directly examine the colon (sigmoidoscopy or colonoscopy) to detect the earliest forms of colorectal cancer. Talk to your health care provider about this at age 57 when routine screening begins. A direct exam of the colon should be repeated every 5-10 years through age 57, unless early forms of precancerous polyps or small growths are found.  People who are at an increased risk for hepatitis B should be screened for this virus. You are considered at high risk for hepatitis B if:  You were born in a country where hepatitis B occurs often. Talk with your health care provider about which countries are considered high risk.  Your parents were born in a high-risk country and you have not received a shot to protect against hepatitis B (hepatitis B vaccine).  You have HIV or AIDS.  You use needles to inject street drugs.  You live with, or have sex with, someone who has hepatitis B.  You are a man who has sex with other men (MSM).  You get hemodialysis treatment.  You take certain medicines for conditions like cancer, organ transplantation, and autoimmune conditions.  Hepatitis C blood testing is recommended for all people born from [birth year] through 1964 and any individual with known risk factors for hepatitis C.  Healthy men should no longer receive prostate-specific  antigen (PSA) blood tests as part of routine cancer screening. Talk to your health care provider about prostate cancer screening.  Testicular cancer screening is not recommended for adolescents or adult males who have no symptoms. Screening includes self-exam, a health care provider exam, and other screening tests. Consult with your health care provider about any symptoms you have or any concerns you have about testicular cancer.  Practice safe sex. Use condoms and avoid high-risk sexual practices to reduce the spread of sexually transmitted infections (STIs).  You should be screened for STIs, including gonorrhea and chlamydia if:  You are sexually active and are younger than 24 years.  You are older than 24 years, and your health care provider tells you that you are at risk for this type of infection.  Your sexual activity has changed since you were last screened, and you are at an increased risk for chlamydia or gonorrhea. Ask your health  care provider if you are at risk.  If you are at risk of being infected with HIV, it is recommended that you take a prescription medicine daily to prevent HIV infection. This is called pre-exposure prophylaxis (PrEP). You are considered at risk if:  You are a man who has sex with other men (MSM).  You are a heterosexual man who is sexually active with multiple partners.  You take drugs by injection.  You are sexually active with a partner who has HIV.  Talk with your health care provider about whether you are at high risk of being infected with HIV. If you choose to begin PrEP, you should first be tested for HIV. You should then be tested every 3 months for as long as you are taking PrEP.  Use sunscreen. Apply sunscreen liberally and repeatedly throughout the day. You should seek shade when your shadow is shorter than you. Protect yourself by wearing long sleeves, pants, a wide-brimmed hat, and sunglasses year round whenever you are outdoors.  Tell  your health care provider of new moles or changes in moles, especially if there is a change in shape or color. Also, tell your health care provider if a mole is larger than the size of a pencil eraser.  A one-time screening for abdominal aortic aneurysm (AAA) and surgical repair of large AAAs by ultrasound is recommended for men aged 23-75 years who are current or former smokers.  Stay current with your vaccines (immunizations). Document Released: 08/20/2007 Document Revised: 02/26/2013 Document Reviewed: 07/19/2010 [facility] Patient Information 2015 [place], [place]. This information is not intended to replace advice given to you by your health care provider. Make sure you discuss any questions you have with your health care provider.

## 2014-12-11 ENCOUNTER — Other Ambulatory Visit: Payer: Self-pay | Admitting: Internal Medicine

## 2015-01-06 ENCOUNTER — Encounter: Payer: Self-pay | Admitting: Adult Health

## 2015-01-06 ENCOUNTER — Ambulatory Visit (INDEPENDENT_AMBULATORY_CARE_PROVIDER_SITE_OTHER): Payer: Medicare Other | Admitting: Adult Health

## 2015-01-06 VITALS — BP 118/80 | HR 67 | Temp 97.9°F | Ht 70.0 in | Wt 178.3 lb

## 2015-01-06 DIAGNOSIS — J209 Acute bronchitis, unspecified: Secondary | ICD-10-CM | POA: Diagnosis not present

## 2015-01-06 MED ORDER — HYDROCODONE-HOMATROPINE 5-1.5 MG/5ML PO SYRP: 5.0000 mL | ORAL_SOLUTION | Freq: Three times a day (TID) | ORAL | Status: DC | PRN

## 2015-01-06 MED ORDER — PREDNISONE 20 MG PO TABS: 20.0000 mg | ORAL_TABLET | Freq: Every day | ORAL | Status: DC

## 2015-01-06 NOTE — Patient Instructions (Addendum)
It was great meeting you today.    Your exam is consistent with bronchitis.   I have sent in a prescription for prednisone, please take this as directed  Day 1 40 mg Day 2 40 mg Day 3 40 mg Day 4 20 mg Day 5 20 mg Day 6 20 mg  Use the cough syrup at night since it will make you sleepy  During the day, Mucinex cough seems to be the best OTC medication for this.   Acute Bronchitis Bronchitis is inflammation of the airways that extend from the windpipe into the lungs (bronchi). The inflammation often causes mucus to develop. This leads to a cough, which is the most common symptom of bronchitis.  In acute bronchitis, the condition usually develops suddenly and goes away over time, usually in a couple weeks. Smoking, allergies, and asthma can make bronchitis worse. Repeated episodes of bronchitis may cause further lung problems.  CAUSES Acute bronchitis is most often caused by the same virus that causes a cold. The virus can spread from person to person (contagious) through coughing, sneezing, and touching contaminated objects. SIGNS AND SYMPTOMS   Cough.   Fever.   Coughing up mucus.   Body aches.   Chest congestion.   Chills.   Shortness of breath.   Sore throat.  DIAGNOSIS  Acute bronchitis is usually diagnosed through a physical exam. Your health care provider will also ask you questions about your medical history. Tests, such as chest X-rays, are sometimes done to rule out other conditions.  TREATMENT  Acute bronchitis usually goes away in a couple weeks. Oftentimes, no medical treatment is necessary. Medicines are sometimes given for relief of fever or cough. Antibiotic medicines are usually not needed but may be prescribed in certain situations. In some cases, an inhaler may be recommended to help reduce shortness of breath and control the cough. A cool mist vaporizer may also be used to help thin bronchial secretions and make it easier to clear the chest.  HOME  CARE INSTRUCTIONS  Get plenty of rest.   Drink enough fluids to keep your urine clear or pale yellow (unless you have a medical condition that requires fluid restriction). Increasing fluids may help thin your respiratory secretions (sputum) and reduce chest congestion, and it will prevent dehydration.   Take medicines only as directed by your health care provider.  If you were prescribed an antibiotic medicine, finish it all even if you start to feel better.  Avoid smoking and secondhand smoke. Exposure to cigarette smoke or irritating chemicals will make bronchitis worse. If you are a smoker, consider using nicotine gum or skin patches to help control withdrawal symptoms. Quitting smoking will help your lungs heal faster.   Reduce the chances of another bout of acute bronchitis by washing your hands frequently, avoiding people with cold symptoms, and trying not to touch your hands to your mouth, nose, or eyes.   Keep all follow-up visits as directed by your health care provider.  SEEK MEDICAL CARE IF: Your symptoms do not improve after 1 week of treatment.  SEEK IMMEDIATE MEDICAL CARE IF:  You develop an increased fever or chills.   You have chest pain.   You have severe shortness of breath.  You have bloody sputum.   You develop dehydration.  You faint or repeatedly feel like you are going to pass out.  You develop repeated vomiting.  You develop a severe headache. MAKE SURE YOU:   Understand these instructions.  Will watch  your condition.  Will get help right away if you are not doing well or get worse.   This information is not intended to replace advice given to you by your health care provider. Make sure you discuss any questions you have with your health care provider.   Document Released: 03/31/2004 Document Revised: 03/14/2014 Document Reviewed: 08/14/2012 Elsevier Interactive Patient Education [employer].

## 2015-01-06 NOTE — Progress Notes (Signed)
Pre visit review using our clinic review tool, if applicable. No additional management support is needed unless otherwise documented below in the visit note. 

## 2015-01-06 NOTE — Progress Notes (Signed)
Subjective:    Patient ID: Richard Sparks, male    DOB: 10-11-1945, 69 y.o.   MRN: [id number]  HPI  69 year old male presents to the office today for dry cough x 1 week. He has tried Robitussin and Halls sugar free cough drops, which did not help. He endorses that his cough is keeping him up at night. He denies feeling SOB or wheezy.He denies any fevers, nausea, vomiting , or diarrhea. Has not traveled and has not had any sick contacts  Review of Systems  Constitutional: Negative.   HENT: Positive for postnasal drip and rhinorrhea (chronic).   Respiratory: Positive for cough. Negative for apnea, chest tightness, shortness of breath and wheezing.   Cardiovascular: Negative.   Musculoskeletal: Negative.   Neurological: Negative.   All other systems reviewed and are negative.  Past Medical History  Diagnosis Date  . BENIGN PROSTATIC HYPERTROPHY 08/01/2006  . DEPRESSION 08/01/2006  . HYPERLIPIDEMIA 08/01/2006  . HYPERTENSION 08/01/2006  . Allergy     Social History   Social History  . Marital Status: Married    Spouse Name: N/A  . Number of Children: N/A  . Years of Education: N/A   Occupational History  . Not on file.   Social History Main Topics  . Smoking status: Never Smoker   . Smokeless tobacco: Never Used  . Alcohol Use: No  . Drug Use: No  . Sexual Activity: Not on file   Other Topics Concern  . Not on file   Social History Narrative    Past Surgical History  Procedure Laterality Date  . No past surgeries    . Colonoscopy      Family History  Problem Relation Age of Onset  . Colon cancer Neg Hx   . Esophageal cancer Neg Hx   . Rectal cancer Neg Hx   . Stomach cancer Neg Hx     Allergies  Allergen Reactions  . Amoxicillin Rash    Current Outpatient Prescriptions on File Prior to Visit  Medication Sig Dispense Refill  . b complex vitamins tablet Take 1 tablet by mouth daily.    Marland Kitchen buPROPion (WELLBUTRIN XL) 150 MG 24 hr tablet Take 150 mg by mouth  daily.    . Cholecalciferol (VITAMIN D) 1000 UNITS capsule Take 1,000 Units by mouth daily.      . clonazePAM (KLONOPIN) 0.5 MG tablet Take 0.5 mg by mouth at bedtime.    Marland Kitchen ibuprofen (ADVIL,MOTRIN) 200 MG tablet Take 200 mg by mouth every 6 (six) hours as needed.    . naproxen sodium (ANAPROX) 220 MG tablet Take 220 mg by mouth as needed.     . Omega-3 Fatty Acids (FISH OIL) 1000 MG CAPS Take 3 capsules by mouth daily.      Marland Kitchen oxymetazoline (AFRIN) 0.05 % nasal spray Place 1 spray into both nostrils 2 (two) times daily as needed.     . pravastatin (PRAVACHOL) 40 MG tablet TAKE ONE TABLET BY MOUTH ONE TIME DAILY 90 tablet 1  . triamterene-hydrochlorothiazide (MAXZIDE-25) 37.5-25 MG per tablet TAKE ONE TABLET BY MOUTH EVERY MORNING 90 tablet 1   No current facility-administered medications on file prior to visit.    BP 118/80 mmHg  Pulse 67  Temp(Src) 97.9 F (36.6 C) (Oral)  Ht 5\' 10"  (1.778 m)  Wt 178 lb 4.8 oz (80.876 kg)  BMI 25.58 kg/m2  SpO2 97%        Objective:   Physical Exam  Constitutional: He is  oriented to person, place, and time. He appears well-developed and well-nourished. No distress.  Neck: Normal range of motion. Neck supple.  Cardiovascular: Normal rate, regular rhythm, normal heart sounds and intact distal pulses.  Exam reveals no gallop and no friction rub.   No murmur heard. Pulmonary/Chest: Effort normal and breath sounds normal. No respiratory distress. He has no wheezes. He has no rales. He exhibits no tenderness.  Increased expiratory time Tight at bases  Lymphadenopathy:    He has no cervical adenopathy.  Neurological: He is alert and oriented to person, place, and time.  Skin: Skin is warm and dry. He is not diaphoretic.  Psychiatric: He has a normal mood and affect. His behavior is normal. Judgment and thought content normal.  Nursing note and vitals reviewed.     Assessment & Plan:  1. Acute bronchitis, unspecified organism -  HYDROcodone-homatropine (HYCODAN) 5-1.5 MG/5ML syrup; Take 5 mLs by mouth every 8 (eight) hours as needed for cough.  Dispense: 120 mL; Refill: 0 - predniSONE (DELTASONE) 20 MG tablet; Take 1 tablet (20 mg total) by mouth daily with breakfast.  Dispense: 9 tablet; Refill: 0 40mg  x 3 days, 20 mg x 3 days - Mucinex for cough during the day.  - Follow up if no improvement in the next 2-3 days.

## 2015-01-13 ENCOUNTER — Encounter: Payer: Self-pay | Admitting: Family Medicine

## 2015-01-13 ENCOUNTER — Ambulatory Visit (INDEPENDENT_AMBULATORY_CARE_PROVIDER_SITE_OTHER): Payer: Medicare Other | Admitting: Family Medicine

## 2015-01-13 VITALS — BP 128/77 | HR 62 | Temp 98.0°F | Ht 70.0 in | Wt 177.0 lb

## 2015-01-13 DIAGNOSIS — J209 Acute bronchitis, unspecified: Secondary | ICD-10-CM

## 2015-01-13 MED ORDER — AZITHROMYCIN 250 MG PO TABS: ORAL_TABLET | ORAL | Status: DC

## 2015-01-13 NOTE — Progress Notes (Signed)
Pre visit review using our clinic review tool, if applicable. No additional management support is needed unless otherwise documented below in the visit note. 

## 2015-01-13 NOTE — Progress Notes (Signed)
   Subjective:    Patient ID: Richard Sparks, male    DOB: [birth year], 69 y.o.   MRN: [id number]  HPI Here for 2 weeks of chest congestion and a dry cough. No fever. He was seen here a week ago and was given a course of prednisone, but this has not helped.    Review of Systems  Constitutional: Negative.   HENT: Negative.   Eyes: Negative.   Respiratory: Positive for cough and chest tightness. Negative for shortness of breath and wheezing.   Cardiovascular: Negative.        Objective:   Physical Exam  Constitutional: He appears well-developed and well-nourished.  HENT:  Right Ear: External ear normal.  Left Ear: External ear normal.  Nose: Nose normal.  Mouth/Throat: Oropharynx is clear and moist.  Eyes: Conjunctivae are normal.  Neck: No thyromegaly present.  Cardiovascular: Normal rate, regular rhythm, normal heart sounds and intact distal pulses.   Pulmonary/Chest: Effort normal. No respiratory distress. He has no wheezes. He has no rales.  Scattered rhonchi   Lymphadenopathy:    He has no cervical adenopathy.          Assessment & Plan:  Bronchitis, given a Zpack.

## 2015-01-21 ENCOUNTER — Ambulatory Visit (INDEPENDENT_AMBULATORY_CARE_PROVIDER_SITE_OTHER): Payer: Medicare Other | Admitting: Family Medicine

## 2015-01-21 ENCOUNTER — Telehealth: Payer: Self-pay | Admitting: Internal Medicine

## 2015-01-21 ENCOUNTER — Encounter: Payer: Self-pay | Admitting: Family Medicine

## 2015-01-21 VITALS — BP 135/76 | HR 77 | Temp 98.5°F | Ht 70.0 in | Wt 173.0 lb

## 2015-01-21 DIAGNOSIS — J209 Acute bronchitis, unspecified: Secondary | ICD-10-CM

## 2015-01-21 MED ORDER — BENZONATATE 200 MG PO CAPS: 200.0000 mg | ORAL_CAPSULE | Freq: Two times a day (BID) | ORAL | Status: DC | PRN

## 2015-01-21 MED ORDER — CEFUROXIME AXETIL 500 MG PO TABS: 500.0000 mg | ORAL_TABLET | Freq: Two times a day (BID) | ORAL | Status: DC

## 2015-01-21 NOTE — Progress Notes (Signed)
Pre visit review using our clinic review tool, if applicable. No additional management support is needed unless otherwise documented below in the visit note. 

## 2015-01-21 NOTE — Telephone Encounter (Signed)
error 

## 2015-01-21 NOTE — Progress Notes (Signed)
   Subjective:    Patient ID: Richard Sparks, male    DOB: 1945/05/28, 69 y.o.   MRN: [id number]  HPI Here to follow up on a bronchitis. For the past 3 weeks he has had a deep dry cough and he was seen here one week ago. He was given a Zpack but this has not helped much. He feels well in general.    Review of Systems  Constitutional: Negative.   HENT: Negative.   Eyes: Negative.   Respiratory: Positive for cough. Negative for shortness of breath and wheezing.   Cardiovascular: Negative.        Objective:   Physical Exam  Constitutional: He appears well-developed and well-nourished.  HENT:  Right Ear: External ear normal.  Left Ear: External ear normal.  Nose: Nose normal.  Mouth/Throat: Oropharynx is clear and moist.  Eyes: Conjunctivae are normal.  Neck: No thyromegaly present.  Cardiovascular: Normal rate, regular rhythm, normal heart sounds and intact distal pulses.   Pulmonary/Chest: Effort normal and breath sounds normal. No respiratory distress. He has no wheezes. He has no rales.  Lymphadenopathy:    He has no cervical adenopathy.          Assessment & Plan:  Partially treated bronchitis. Try Ceftin and Benzonatate.

## 2015-03-08 ENCOUNTER — Other Ambulatory Visit: Payer: Self-pay | Admitting: Internal Medicine

## 2015-06-05 ENCOUNTER — Other Ambulatory Visit: Payer: Self-pay | Admitting: Internal Medicine

## 2015-06-05 MED ORDER — PRAVASTATIN SODIUM 40 MG PO TABS: 40.0000 mg | ORAL_TABLET | Freq: Every day | ORAL | Status: DC

## 2015-06-05 MED ORDER — TRIAMTERENE-HCTZ 37.5-25 MG PO TABS: 1.0000 | ORAL_TABLET | Freq: Every morning | ORAL | Status: DC

## 2015-06-06 ENCOUNTER — Other Ambulatory Visit: Payer: Self-pay | Admitting: Internal Medicine

## 2015-06-09 ENCOUNTER — Other Ambulatory Visit: Payer: Self-pay | Admitting: Internal Medicine

## 2015-06-09 MED ORDER — PRAVASTATIN SODIUM 40 MG PO TABS: 40.0000 mg | ORAL_TABLET | Freq: Every day | ORAL | Status: DC

## 2015-06-09 MED ORDER — TRIAMTERENE-HCTZ 37.5-25 MG PO TABS: 1.0000 | ORAL_TABLET | Freq: Every morning | ORAL | Status: DC

## 2015-07-03 ENCOUNTER — Encounter: Payer: Self-pay | Admitting: Adult Health

## 2015-07-03 ENCOUNTER — Ambulatory Visit (INDEPENDENT_AMBULATORY_CARE_PROVIDER_SITE_OTHER): Payer: Medicare Other | Admitting: Adult Health

## 2015-07-03 VITALS — BP 130/68 | Temp 97.9°F | Wt 179.1 lb

## 2015-07-03 DIAGNOSIS — L259 Unspecified contact dermatitis, unspecified cause: Secondary | ICD-10-CM | POA: Diagnosis not present

## 2015-07-03 MED ORDER — METHYLPREDNISOLONE ACETATE 80 MG/ML IJ SUSP
120.0000 mg | Freq: Once | INTRAMUSCULAR | Status: AC
  Administered 2015-07-03: 120 mg via INTRAMUSCULAR

## 2015-07-03 NOTE — Patient Instructions (Addendum)
It was great seeing you today!  I am sorry you have this rash.   It appears as you are allergic to something, we many never know what it is from.   The cortisone shot should take care of this rash .   Follow up if no improvement in the next 2-3 days.   Contact Dermatitis Dermatitis is redness, soreness, and swelling (inflammation) of the skin. Contact dermatitis is a reaction to certain substances that touch the skin. There are two types of contact dermatitis:   Irritant contact dermatitis. This type is caused by something that irritates your skin, such as dry hands from washing them too much. This type does not require previous exposure to the substance for a reaction to occur. This type is more common.  Allergic contact dermatitis. This type is caused by a substance that you are allergic to, such as a nickel allergy or poison ivy. This type only occurs if you have been exposed to the substance (allergen) before. Upon a repeat exposure, your body reacts to the substance. This type is less common. CAUSES  Many different substances can cause contact dermatitis. Irritant contact dermatitis is most commonly caused by exposure to:   Makeup.   Soaps.   Detergents.   Bleaches.   Acids.   Metal salts, such as nickel.  Allergic contact dermatitis is most commonly caused by exposure to:   Poisonous plants.   Chemicals.   Jewelry.   Latex.   Medicines.   Preservatives in products, such as clothing.  RISK FACTORS This condition is more likely to develop in:   People who have jobs that expose them to irritants or allergens.  People who have certain medical conditions, such as asthma or eczema.  SYMPTOMS  Symptoms of this condition may occur anywhere on your body where the irritant has touched you or is touched by you. Symptoms include:  Dryness or flaking.   Redness.   Cracks.   Itching.   Pain or a burning feeling.   Blisters.  Drainage of small  amounts of blood or clear fluid from skin cracks. With allergic contact dermatitis, there may also be swelling in areas such as the eyelids, mouth, or genitals.  DIAGNOSIS  This condition is diagnosed with a medical history and physical exam. A patch skin test may be performed to help determine the cause. If the condition is related to your job, you may need to see an occupational medicine specialist. TREATMENT Treatment for this condition includes figuring out what caused the reaction and protecting your skin from further contact. Treatment may also include:   Steroid creams or ointments. Oral steroid medicines may be needed in more severe cases.  Antibiotics or antibacterial ointments, if a skin infection is present.  Antihistamine lotion or an antihistamine taken by mouth to ease itching.  A bandage (dressing). HOME CARE INSTRUCTIONS Skin Care  Moisturize your skin as needed.   Apply cool compresses to the affected areas.  Try taking a bath with:  Epsom salts. Follow the instructions on the packaging. You can get these at your local pharmacy or grocery store.  Baking soda. Pour a small amount into the bath as directed by your health care provider.  Colloidal oatmeal. Follow the instructions on the packaging. You can get this at your local pharmacy or grocery store.  Try applying baking soda paste to your skin. Stir water into baking soda until it reaches a paste-like consistency.  Do not scratch your skin.  Bathe less  frequently, such as every other day.  Bathe in lukewarm water. Avoid using hot water. Medicines  Take or apply over-the-counter and prescription medicines only as told by your health care provider.   If you were prescribed an antibiotic medicine, take or apply your antibiotic as told by your health care provider. Do not stop using the antibiotic even if your condition starts to improve. General Instructions  Keep all follow-up visits as told by your  health care provider. This is important.  Avoid the substance that caused your reaction. If you do not know what caused it, keep a journal to try to track what caused it. Write down:  What you eat.  What cosmetic products you use.  What you drink.  What you wear in the affected area. This includes jewelry.  If you were given a dressing, take care of it as told by your health care provider. This includes when to change and remove it. SEEK MEDICAL CARE IF:   Your condition does not improve with treatment.  Your condition gets worse.  You have signs of infection such as swelling, tenderness, redness, soreness, or warmth in the affected area.  You have a fever.  You have new symptoms. SEEK IMMEDIATE MEDICAL CARE IF:   You have a severe headache, neck pain, or neck stiffness.  You vomit.  You feel very sleepy.  You notice red streaks coming from the affected area.  Your bone or joint underneath the affected area becomes painful after the skin has healed.  The affected area turns darker.  You have difficulty breathing.   This information is not intended to replace advice given to you by your health care provider. Make sure you discuss any questions you have with your health care provider.   Document Released: 02/19/2000 Document Revised: 11/12/2014 Document Reviewed: 07/09/2014 Elsevier Interactive Patient Education [employer].

## 2015-07-03 NOTE — Progress Notes (Signed)
   Subjective:    Patient ID: Richard Sparks, male    DOB: 1945-10-09, 70 y.o.   MRN: [id number]  HPI  70 year old male who presents to the office today for rash on bilateral arms and left thigh. He reports that about a month ago he had a rash show up on his arms and legs - he used Eucerin cream, which helped get rid of the rash. Over the last few days he has had this rash pop up. He complains of severe itching.   He denies any changes in soaps,detergents, foods, lotions.   Does have two areas on left arm that have crusted over. Denies any drainage.   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   He has been using various creams to try and get rid of the rash/itch.   Review of Systems  Constitutional: Negative.   Respiratory: Negative.   Cardiovascular: Negative.   Skin: Positive for color change and rash.  Psychiatric/Behavioral: Positive for sleep disturbance.  All other systems reviewed and are negative.      Objective:   Physical Exam  Constitutional: He is oriented to person, place, and time. He appears well-developed and well-nourished. No distress.  Neurological: He is alert and oriented to person, place, and time.  Skin: Skin is warm and dry. Rash noted. He is not diaphoretic. There is erythema.     Red, raised, pruritic rash on bilateral forearms and left inner thigh  Psychiatric: He has a normal mood and affect. His behavior is normal. Judgment and thought content normal.  Nursing note and vitals reviewed.      Assessment & Plan:  1. Contact dermatitis - Appears as rash from a plant- possibly poison oak.  - methylPREDNISolone acetate (DEPO-MEDROL) injection 120 mg; Inject 1.5 mLs (120 mg total) into the muscle once. - OTC hydrocortisone cream.  - Benadryl cream - Follow up if no improvement   Dorothyann Peng, NP

## 2015-07-06 ENCOUNTER — Encounter: Payer: Self-pay | Admitting: Internal Medicine

## 2015-07-06 ENCOUNTER — Ambulatory Visit (INDEPENDENT_AMBULATORY_CARE_PROVIDER_SITE_OTHER): Payer: Medicare Other | Admitting: Internal Medicine

## 2015-07-06 VITALS — BP 140/80 | HR 61 | Temp 97.9°F | Resp 20 | Ht 70.0 in | Wt 179.0 lb

## 2015-07-06 DIAGNOSIS — I10 Essential (primary) hypertension: Secondary | ICD-10-CM | POA: Diagnosis not present

## 2015-07-06 DIAGNOSIS — L259 Unspecified contact dermatitis, unspecified cause: Secondary | ICD-10-CM

## 2015-07-06 NOTE — Progress Notes (Signed)
Subjective:    Patient ID: Richard Sparks, male    DOB: 04/02/1945, 70 y.o.   MRN: [id number]  HPI   70 year old patient who has essential hypertension.  He was seen last week and treated with Depo-Medrol 120 mg IM for a contact dermatitis involving the arms and inner aspect of the left thigh region.  He expected improvement within 48 hours.  Rash is little changed.  No issues with the Depo-Medrol.  Topical therapies discussed  Past Medical History  Diagnosis Date  . BENIGN PROSTATIC HYPERTROPHY 08/01/2006  . DEPRESSION 08/01/2006  . HYPERLIPIDEMIA 08/01/2006  . HYPERTENSION 08/01/2006  . Allergy      Social History   Social History  . Marital Status: Married    Spouse Name: N/A  . Number of Children: N/A  . Years of Education: N/A   Occupational History  . Not on file.   Social History Main Topics  . Smoking status: Never Smoker   . Smokeless tobacco: Never Used  . Alcohol Use: No  . Drug Use: No  . Sexual Activity: Not on file   Other Topics Concern  . Not on file   Social History Narrative    Past Surgical History  Procedure Laterality Date  . No past surgeries    . Colonoscopy      Family History  Problem Relation Age of Onset  . Colon cancer Neg Hx   . Esophageal cancer Neg Hx   . Rectal cancer Neg Hx   . Stomach cancer Neg Hx     Allergies  Allergen Reactions  . Amoxicillin Rash    Current Outpatient Prescriptions on File Prior to Visit  Medication Sig Dispense Refill  . b complex vitamins tablet Take 1 tablet by mouth daily.    Marland Kitchen buPROPion (WELLBUTRIN XL) 150 MG 24 hr tablet Take 150 mg by mouth daily.    . Cholecalciferol (VITAMIN D) 1000 UNITS capsule Take 1,000 Units by mouth daily.      . clonazePAM (KLONOPIN) 0.5 MG tablet Take 0.5 mg by mouth at bedtime.    Marland Kitchen ibuprofen (ADVIL,MOTRIN) 200 MG tablet Take 200 mg by mouth every 6 (six) hours as needed.    . naproxen sodium (ANAPROX) 220 MG tablet Take 220 mg by mouth as needed.     . Omega-3  Fatty Acids (FISH OIL) 1000 MG CAPS Take 3 capsules by mouth daily.      Marland Kitchen oxymetazoline (AFRIN) 0.05 % nasal spray Place 1 spray into both nostrils 2 (two) times daily as needed.     . pravastatin (PRAVACHOL) 40 MG tablet Take 1 tablet (40 mg total) by mouth daily. 90 tablet 1  . triamterene-hydrochlorothiazide (MAXZIDE-25) 37.5-25 MG tablet Take 1 tablet by mouth every morning. 90 tablet 2   No current facility-administered medications on file prior to visit.    BP 140/80 mmHg  Pulse 61  Temp(Src) 97.9 F (36.6 C) (Oral)  Resp 20  Ht 5\' 10"  (1.778 m)  Wt 179 lb (81.194 kg)  BMI 25.68 kg/m2  SpO2 98%     Review of Systems  Skin: Positive for rash.       Objective:   Physical Exam  Constitutional: He appears well-developed and well-nourished. No distress.  Skin:  Dermatitis affecting both lower arms as well as some resolving erythema involving his left medial thigh; Acute eczematous rash involving his left wrist area with vesicles and excoriation          Assessment & Plan:   .  Contact dermatitis.  Patient received Depo-Medrol.  3 days ago Topical therapy.  Discussed  He was notified that this dermatitis.  Will take several days to resolve completely

## 2015-07-06 NOTE — Patient Instructions (Signed)
Contact Dermatitis Dermatitis is redness, soreness, and swelling (inflammation) of the skin. Contact dermatitis is a reaction to certain substances that touch the skin. There are two types of contact dermatitis:   Irritant contact dermatitis. This type is caused by something that irritates your skin, such as dry hands from washing them too much. This type does not require previous exposure to the substance for a reaction to occur. This type is more common.  Allergic contact dermatitis. This type is caused by a substance that you are allergic to, such as a nickel allergy or poison ivy. This type only occurs if you have been exposed to the substance (allergen) before. Upon a repeat exposure, your body reacts to the substance. This type is less common. CAUSES  Many different substances can cause contact dermatitis. Irritant contact dermatitis is most commonly caused by exposure to:   Makeup.   Soaps.   Detergents.   Bleaches.   Acids.   Metal salts, such as nickel.  Allergic contact dermatitis is most commonly caused by exposure to:   Poisonous plants.   Chemicals.   Jewelry.   Latex.   Medicines.   Preservatives in products, such as clothing.  RISK FACTORS This condition is more likely to develop in:   People who have jobs that expose them to irritants or allergens.  People who have certain medical conditions, such as asthma or eczema.  SYMPTOMS  Symptoms of this condition may occur anywhere on your body where the irritant has touched you or is touched by you. Symptoms include:  Dryness or flaking.   Redness.   Cracks.   Itching.   Pain or a burning feeling.   Blisters.  Drainage of small amounts of blood or clear fluid from skin cracks. With allergic contact dermatitis, there may also be swelling in areas such as the eyelids, mouth, or genitals.  DIAGNOSIS  This condition is diagnosed with a medical history and physical exam. A patch skin test  may be performed to help determine the cause. If the condition is related to your job, you may need to see an occupational medicine specialist. TREATMENT Treatment for this condition includes figuring out what caused the reaction and protecting your skin from further contact. Treatment may also include:   Steroid creams or ointments. Oral steroid medicines may be needed in more severe cases.  Antibiotics or antibacterial ointments, if a skin infection is present.  Antihistamine lotion or an antihistamine taken by mouth to ease itching.  A bandage (dressing). HOME CARE INSTRUCTIONS Skin Care  Moisturize your skin as needed.   Apply cool compresses to the affected areas.  Try taking a bath with:  Epsom salts. Follow the instructions on the packaging. You can get these at your local pharmacy or grocery store.  Baking soda. Pour a small amount into the bath as directed by your health care provider.  Colloidal oatmeal. Follow the instructions on the packaging. You can get this at your local pharmacy or grocery store.  Try applying baking soda paste to your skin. Stir water into baking soda until it reaches a paste-like consistency.  Do not scratch your skin.  Bathe less frequently, such as every other day.  Bathe in lukewarm water. Avoid using hot water. Medicines  Take or apply over-the-counter and prescription medicines only as told by your health care provider.   If you were prescribed an antibiotic medicine, take or apply your antibiotic as told by your health care provider. Do not stop using the   antibiotic even if your condition starts to improve. General Instructions  Keep all follow-up visits as told by your health care provider. This is important.  Avoid the substance that caused your reaction. If you do not know what caused it, keep a journal to try to track what caused it. Write down:  What you eat.  What cosmetic products you use.  What you drink.  What  you wear in the affected area. This includes jewelry.  If you were given a dressing, take care of it as told by your health care provider. This includes when to change and remove it. SEEK MEDICAL CARE IF:   Your condition does not improve with treatment.  Your condition gets worse.  You have signs of infection such as swelling, tenderness, redness, soreness, or warmth in the affected area.  You have a fever.  You have new symptoms. SEEK IMMEDIATE MEDICAL CARE IF:   You have a severe headache, neck pain, or neck stiffness.  You vomit.  You feel very sleepy.  You notice red streaks coming from the affected area.  Your bone or joint underneath the affected area becomes painful after the skin has healed.  The affected area turns darker.  You have difficulty breathing.   This information is not intended to replace advice given to you by your health care provider. Make sure you discuss any questions you have with your health care provider.   Document Released: 02/19/2000 Document Revised: 11/12/2014 Document Reviewed: 07/09/2014 Elsevier Interactive Patient Education 2016 [employer]  

## 2015-07-06 NOTE — Progress Notes (Signed)
Pre visit review using our clinic review tool, if applicable. No additional management support is needed unless otherwise documented below in the visit note. 

## 2015-11-03 ENCOUNTER — Other Ambulatory Visit (INDEPENDENT_AMBULATORY_CARE_PROVIDER_SITE_OTHER): Payer: Medicare Other

## 2015-11-03 DIAGNOSIS — R7989 Other specified abnormal findings of blood chemistry: Secondary | ICD-10-CM | POA: Diagnosis not present

## 2015-11-03 DIAGNOSIS — Z0000 Encounter for general adult medical examination without abnormal findings: Secondary | ICD-10-CM | POA: Diagnosis not present

## 2015-11-03 LAB — CBC WITH DIFFERENTIAL/PLATELET
BASOS ABS: 0 10*3/uL (ref 0.0–0.1)
Basophils Relative: 0.4 % (ref 0.0–3.0)
EOS ABS: 0.2 10*3/uL (ref 0.0–0.7)
Eosinophils Relative: 2.7 % (ref 0.0–5.0)
HCT: 47 % (ref 39.0–52.0)
Hemoglobin: 16.3 g/dL (ref 13.0–17.0)
LYMPHS ABS: 2.1 10*3/uL (ref 0.7–4.0)
Lymphocytes Relative: 27.8 % (ref 12.0–46.0)
MCHC: 34.7 g/dL (ref 30.0–36.0)
MCV: 88.5 fl (ref 78.0–100.0)
MONO ABS: 0.6 10*3/uL (ref 0.1–1.0)
Monocytes Relative: 8 % (ref 3.0–12.0)
NEUTROS ABS: 4.7 10*3/uL (ref 1.4–7.7)
NEUTROS PCT: 61.1 % (ref 43.0–77.0)
PLATELETS: 229 10*3/uL (ref 150.0–400.0)
RBC: 5.31 Mil/uL (ref 4.22–5.81)
RDW: 13 % (ref 11.5–15.5)
WBC: 7.7 10*3/uL (ref 4.0–10.5)

## 2015-11-03 LAB — LIPID PANEL
CHOL/HDL RATIO: 5
Cholesterol: 211 mg/dL — ABNORMAL HIGH (ref 0–200)
HDL: 41 mg/dL (ref >39.00)
NonHDL: 170.09
Triglycerides: 319 mg/dL — ABNORMAL HIGH (ref 0.0–149.0)
VLDL: 63.8 mg/dL — AB (ref 0.0–40.0)

## 2015-11-03 LAB — BASIC METABOLIC PANEL
BUN: 17 mg/dL (ref 6–23)
CALCIUM: 9.2 mg/dL (ref 8.4–10.5)
CO2: 31 meq/L (ref 19–32)
CREATININE: 1.13 mg/dL (ref 0.40–1.50)
Chloride: 103 mEq/L (ref 96–112)
GFR: 68.35 mL/min (ref >60.00)
GLUCOSE: 94 mg/dL (ref 70–99)
Potassium: 3.8 mEq/L (ref 3.5–5.1)
SODIUM: 141 meq/L (ref 135–145)

## 2015-11-03 LAB — POC URINALSYSI DIPSTICK (AUTOMATED)
BILIRUBIN UA: NEGATIVE
Blood, UA: NEGATIVE
Glucose, UA: NEGATIVE
KETONES UA: NEGATIVE
LEUKOCYTES UA: NEGATIVE
Nitrite, UA: NEGATIVE
PH UA: 6
Protein, UA: NEGATIVE
SPEC GRAV UA: 1.02
Urobilinogen, UA: 0.2

## 2015-11-03 LAB — HEPATIC FUNCTION PANEL
ALBUMIN: 4.2 g/dL (ref 3.5–5.2)
ALK PHOS: 51 U/L (ref 39–117)
ALT: 22 U/L (ref 0–53)
AST: 19 U/L (ref 0–37)
BILIRUBIN DIRECT: 0.1 mg/dL (ref 0.0–0.3)
BILIRUBIN TOTAL: 0.9 mg/dL (ref 0.2–1.2)
Total Protein: 6.5 g/dL (ref 6.0–8.3)

## 2015-11-03 LAB — TSH: TSH: 1.42 u[IU]/mL (ref 0.35–4.50)

## 2015-11-03 LAB — LDL CHOLESTEROL, DIRECT: LDL DIRECT: 121 mg/dL

## 2015-11-10 ENCOUNTER — Ambulatory Visit (INDEPENDENT_AMBULATORY_CARE_PROVIDER_SITE_OTHER): Payer: Medicare Other | Admitting: Internal Medicine

## 2015-11-10 ENCOUNTER — Encounter: Payer: Self-pay | Admitting: Internal Medicine

## 2015-11-10 VITALS — BP 120/70 | HR 71 | Temp 97.4°F | Resp 20 | Ht 70.0 in | Wt 178.5 lb

## 2015-11-10 DIAGNOSIS — I10 Essential (primary) hypertension: Secondary | ICD-10-CM

## 2015-11-10 DIAGNOSIS — Z86010 Personal history of colonic polyps: Secondary | ICD-10-CM

## 2015-11-10 DIAGNOSIS — E785 Hyperlipidemia, unspecified: Secondary | ICD-10-CM | POA: Diagnosis not present

## 2015-11-10 DIAGNOSIS — Z0000 Encounter for general adult medical examination without abnormal findings: Secondary | ICD-10-CM | POA: Diagnosis not present

## 2015-11-10 NOTE — Patient Instructions (Addendum)
Limit your sodium (Salt) intake  Please check your blood pressure on a regular basis.  If it is consistently greater than 150/90, please make an office appointment.    It is important that you exercise regularly, at least 20 minutes 3 to 4 times per week.  If you develop chest pain or shortness of breath seek  medical attention.  Return in one year for follow-up   Fat and Cholesterol Restricted Diet High levels of fat and cholesterol in your blood may lead to various health problems, such as diseases of the heart, blood vessels, gallbladder, liver, and pancreas. Fats are concentrated sources of energy that come in various forms. Certain types of fat, including saturated fat, may be harmful in excess. Cholesterol is a substance needed by your body in small amounts. Your body makes all the cholesterol it needs. Excess cholesterol comes from the food you eat. When you have high levels of cholesterol and saturated fat in your blood, health problems can develop because the excess fat and cholesterol will gather along the walls of your blood vessels, causing them to narrow. Choosing the right foods will help you control your intake of fat and cholesterol. This will help keep the levels of these substances in your blood within normal limits and reduce your risk of disease. WHAT IS MY PLAN? Your health care provider recommends that you:  Get no more than __________ % of the total calories in your daily diet from fat.  Limit your intake of saturated fat to less than ______% of your total calories each day.  Limit the amount of cholesterol in your diet to less than _________mg per day. WHAT TYPES OF FAT SHOULD I CHOOSE?  Choose healthy fats more often. Choose monounsaturated and polyunsaturated fats, such as olive and canola oil, flaxseeds, walnuts, almonds, and seeds.  Eat more omega-3 fats. Good choices include salmon, mackerel, sardines, tuna, flaxseed oil, and ground flaxseeds. Aim to eat fish at  least two times a week.  Limit saturated fats. Saturated fats are primarily found in animal products, such as meats, butter, and cream. Plant sources of saturated fats include palm oil, palm kernel oil, and coconut oil.  Avoid foods with partially hydrogenated oils in them. These contain trans fats. Examples of foods that contain trans fats are stick margarine, some tub margarines, cookies, crackers, and other baked goods. WHAT GENERAL GUIDELINES DO I NEED TO FOLLOW? These guidelines for healthy eating will help you control your intake of fat and cholesterol:  Check food labels carefully to identify foods with trans fats or high amounts of saturated fat.  Fill one half of your plate with vegetables and green salads.  Fill one fourth of your plate with whole grains. Look for the word "whole" as the first word in the ingredient list.  Fill one fourth of your plate with lean protein foods.  Limit fruit to two servings a day. Choose fruit instead of juice.  Eat more foods that contain soluble fiber. Examples of foods that contain this type of fiber are apples, broccoli, carrots, beans, peas, and barley. Aim to get 20-30 g of fiber per day.  Eat more home-cooked food and less restaurant, buffet, and fast food.  Limit or avoid alcohol.  Limit foods high in starch and sugar.  Limit fried foods.  Cook foods using methods other than frying. Baking, boiling, grilling, and broiling are all great options.  Lose weight if you are overweight. Losing just 5-10% of your initial body weight can  help your overall health and prevent diseases such as diabetes and heart disease. WHAT FOODS CAN I EAT? Grains Whole grains, such as whole wheat or whole grain breads, crackers, cereals, and pasta. Unsweetened oatmeal, bulgur, barley, quinoa, or brown rice. Corn or whole wheat flour tortillas. Vegetables Fresh or frozen vegetables (raw, steamed, roasted, or grilled). Green salads. Fruits All fresh, canned  (in natural juice), or frozen fruits. Meat and Other Protein Products Ground beef (85% or leaner), grass-fed beef, or beef trimmed of fat. Skinless chicken or [place]. Ground chicken or [place]. Pork trimmed of fat. All fish and seafood. Eggs. Dried beans, peas, or lentils. Unsalted nuts or seeds. Unsalted canned or dry beans. Dairy Low-fat dairy products, such as skim or 1% milk, 2% or reduced-fat cheeses, low-fat ricotta or cottage cheese, or plain low-fat yogurt. Fats and Oils Tub margarines without trans fats. Light or reduced-fat mayonnaise and salad dressings. Avocado. Olive, canola, sesame, or safflower oils. Natural peanut or almond butter (choose ones without added sugar and oil). The items listed above may not be a complete list of recommended foods or beverages. Contact your dietitian for more options. WHAT FOODS ARE NOT RECOMMENDED? Grains White bread. White pasta. White rice. Cornbread. Bagels, pastries, and croissants. Crackers that contain trans fat. Vegetables White potatoes. Corn. Creamed or fried vegetables. Vegetables in a cheese sauce. Fruits Dried fruits. Canned fruit in light or heavy syrup. Fruit juice. Meat and Other Protein Products Fatty cuts of meat. Ribs, chicken wings, bacon, sausage, bologna, salami, chitterlings, fatback, hot dogs, bratwurst, and packaged luncheon meats. Liver and organ meats. Dairy Whole or 2% milk, cream, half-and-half, and cream cheese. Whole milk cheeses. Whole-fat or sweetened yogurt. Full-fat cheeses. Nondairy creamers and whipped toppings. Processed cheese, cheese spreads, or cheese curds. Sweets and Desserts Corn syrup, sugars, honey, and molasses. Candy. Jam and jelly. Syrup. Sweetened cereals. Cookies, pies, cakes, donuts, muffins, and ice cream. Fats and Oils Butter, stick margarine, lard, shortening, ghee, or bacon fat. Coconut, palm kernel, or palm oils. Beverages Alcohol. Sweetened drinks (such as sodas, lemonade, and fruit drinks or  punches). The items listed above may not be a complete list of foods and beverages to avoid. Contact your dietitian for more information.   This information is not intended to replace advice given to you by your health care provider. Make sure you discuss any questions you have with your health care provider.   Document Released: 02/21/2005 Document Revised: 03/14/2014 Document Reviewed: 05/22/2013 Elsevier Interactive Patient Education [employer].

## 2015-11-10 NOTE — Progress Notes (Signed)
Patient ID: Richard Sparks, male   DOB: 1945/03/30, 70 y.o.   MRN: [id number] Patient ID: Richard Sparks, male   DOB: 1945-07-23, 70 y.o.   MRN: [id number]  Subjective:    Patient ID: Richard Sparks, male    DOB: 1945-07-23, 70 y.o.   MRN: [id number]  Hypertension  Pertinent negatives include no chest pain, headaches, neck pain, palpitations or shortness of breath.    70  year-old patient who is seen today for an annual exam.  Patient did have a follow-up colonoscopy 2 years ago that did reveal a tubular adenoma.  No concerns or complaints.   1. Risk factors, based on past  M,S,F history- cardiovascular risk factors include hypertension and dyslipidemia. Family history of longevity  2.  Physical activities: No exercise limitations. Does go to his health club 3 times weekly  3.  Depression/mood: History depression well controlled with welbutrin.  Followed by psychiatry  4.  Hearing: No deficits  5.  ADL's:  Independent in all aspects of daily living  6.  Fall risk:  Low  7.  Home safety:  No problems identified   8.  Height weight, and visual acuity; height and weight stable no change in visual acuity  9.  Counseling: Heart healthy diet regular exercise regimen encouraged  10. Lab orders based on risk factors: Laboratory profile including lipid panel reviewed  11. Referral : Not appropriate at this time  12. Care plan: Continue a regular exercise program heart healthy diet  13. Cognitive assessment: Alert and oriented with normal affect. No cognitive dysfunction  14.  Preventive services will include annual clinical exams with screening lab as well as yearly eye examinations.  Due to his history of colonic polyps.  He will have 5 year colonoscopies at five-year intervals.  15.  Provider list update includes primary care GI ophthalmology as well as psychiatry    Allergies:  1) Amoxicillin (Amoxicillin)   Past History:  Past Medical History:   Depression  Hypertension   Benign prostatic hypertrophy  Hyperlipidemia  Skin cancer  Past Surgical History:   no prior surgeries  colonoscopy 2005 2015  Family History:   father age 85, has a history of a thyroid disorder in type 2 diabetes  mother, age 44, is in good health  One brother 3 sisters, all remain in good health   niece died at age 18 of a brain tumor. 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  HENT: Negative for congestion, dental problem, ear pain, hearing loss, mouth sores, rhinorrhea, sinus pressure, sneezing, tinnitus, trouble swallowing and voice change.   Eyes: Negative for photophobia, pain, redness and visual disturbance.  Respiratory: Negative for apnea, cough, choking, chest tightness, shortness of breath and wheezing.   Cardiovascular: Negative for chest pain, palpitations and leg swelling.  Gastrointestinal: Negative for abdominal distention, abdominal pain, anal bleeding, blood in stool, constipation, diarrhea, nausea, rectal pain and vomiting.  Genitourinary: Negative for decreased urine volume, difficulty urinating, discharge, dysuria, flank pain, frequency, genital sores, hematuria, penile swelling, scrotal swelling, testicular pain and urgency.  Musculoskeletal: Negative for arthralgias, back pain, gait problem, joint swelling, myalgias, neck pain and neck stiffness.  Skin: Negative for color change, rash and wound.  Neurological: Negative for dizziness,  tremors, seizures, syncope, facial asymmetry, speech difficulty, weakness, light-headedness, numbness and headaches.  Hematological: Negative for adenopathy. Does not bruise/bleed easily.  Psychiatric/Behavioral: Negative for agitation, behavioral problems, confusion, decreased  concentration, dysphoric mood, hallucinations, self-injury, sleep disturbance and suicidal ideas. The patient is not nervous/anxious.        Objective:   Physical Exam  Constitutional: He appears well-developed and well-nourished.  HENT:  Head: Normocephalic and atraumatic.  Right Ear: External ear normal.  Left Ear: External ear normal.  Nose: Nose normal.  Mouth/Throat: Oropharynx is clear and moist.  Mild pharyngeal crowding  Eyes: Conjunctivae and EOM are normal. Pupils are equal, round, and reactive to light. No scleral icterus.  Neck: Normal range of motion. Neck supple. No JVD present. No thyromegaly present.  Cardiovascular: Regular rhythm, normal heart sounds and intact distal pulses.  Exam reveals no gallop and no friction rub.   No murmur heard. Pulmonary/Chest: Effort normal and breath sounds normal. He exhibits no tenderness.  Abdominal: Soft. Bowel sounds are normal. He exhibits no distension and no mass. There is no tenderness.  Genitourinary: Prostate normal and penis normal. Rectal exam shows guaiac negative stool.  Genitourinary Comments: Prostate plus 2 enlarged  Musculoskeletal: Normal range of motion. He exhibits no edema or tenderness.  Lymphadenopathy:    He has no cervical adenopathy.  Neurological: He is alert. He has normal reflexes. No cranial nerve deficit. Coordination normal.  Skin: Skin is warm and dry. No rash noted.  Psychiatric: He has a normal mood and affect. His behavior is normal.          Assessment & Plan:  Annual clinical exam  Hypertension controlled Dyslipidemia. Will continue  pravastatin to 40 mg daily Depression.  Follow-up psychiatry Mild BPH History colonic polyps.  Follow-up colonoscopy 3 years  Low-salt diet Home blood pressure monitoring encouraged Heart healthy diet, exercise regimen encouraged   Recheck 1 year  Nyoka Cowden

## 2016-01-13 ENCOUNTER — Other Ambulatory Visit: Payer: Self-pay | Admitting: Internal Medicine

## 2016-02-25 ENCOUNTER — Other Ambulatory Visit: Payer: Self-pay | Admitting: Internal Medicine

## 2016-06-02 ENCOUNTER — Telehealth: Payer: Self-pay | Admitting: Internal Medicine

## 2016-06-02 NOTE — Telephone Encounter (Signed)
Noted, chart was updated

## 2016-06-02 NOTE — Telephone Encounter (Signed)
Richard Sparks pt returned your call about the flu shot he stated that he has not had one and do not plan to have one due to him living by himself and works from home.

## 2016-10-15 ENCOUNTER — Other Ambulatory Visit: Payer: Self-pay | Admitting: Internal Medicine

## 2016-11-08 ENCOUNTER — Other Ambulatory Visit: Payer: Medicare Other

## 2016-11-09 ENCOUNTER — Encounter: Payer: Medicare Other | Admitting: Internal Medicine

## 2016-11-15 ENCOUNTER — Ambulatory Visit (INDEPENDENT_AMBULATORY_CARE_PROVIDER_SITE_OTHER): Payer: Medicare Other | Admitting: Internal Medicine

## 2016-11-15 ENCOUNTER — Encounter: Payer: Self-pay | Admitting: Internal Medicine

## 2016-11-15 VITALS — BP 122/74 | HR 62 | Temp 97.7°F | Ht 70.0 in | Wt 184.0 lb

## 2016-11-15 DIAGNOSIS — Z0000 Encounter for general adult medical examination without abnormal findings: Secondary | ICD-10-CM

## 2016-11-15 DIAGNOSIS — I10 Essential (primary) hypertension: Secondary | ICD-10-CM

## 2016-11-15 DIAGNOSIS — Z86010 Personal history of colonic polyps: Secondary | ICD-10-CM | POA: Diagnosis not present

## 2016-11-15 DIAGNOSIS — E785 Hyperlipidemia, unspecified: Secondary | ICD-10-CM | POA: Diagnosis not present

## 2016-11-15 LAB — LIPID PANEL
CHOL/HDL RATIO: 5
Cholesterol: 197 mg/dL (ref 0–200)
HDL: 39.3 mg/dL (ref >39.00)
NONHDL: 157.82
TRIGLYCERIDES: 376 mg/dL — AB (ref 0.0–149.0)
VLDL: 75.2 mg/dL — AB (ref 0.0–40.0)

## 2016-11-15 LAB — CBC WITH DIFFERENTIAL/PLATELET
Basophils Absolute: 0 10*3/uL (ref 0.0–0.1)
Basophils Relative: 0.7 % (ref 0.0–3.0)
Eosinophils Absolute: 0.2 10*3/uL (ref 0.0–0.7)
Eosinophils Relative: 2.5 % (ref 0.0–5.0)
HCT: 47.3 % (ref 39.0–52.0)
HEMOGLOBIN: 16.3 g/dL (ref 13.0–17.0)
LYMPHS ABS: 2.2 10*3/uL (ref 0.7–4.0)
Lymphocytes Relative: 30.8 % (ref 12.0–46.0)
MCHC: 34.4 g/dL (ref 30.0–36.0)
MCV: 88.4 fl (ref 78.0–100.0)
MONO ABS: 0.5 10*3/uL (ref 0.1–1.0)
MONOS PCT: 7 % (ref 3.0–12.0)
Neutro Abs: 4.1 10*3/uL (ref 1.4–7.7)
Neutrophils Relative %: 59 % (ref 43.0–77.0)
Platelets: 229 10*3/uL (ref 150.0–400.0)
RBC: 5.35 Mil/uL (ref 4.22–5.81)
RDW: 13.4 % (ref 11.5–15.5)
WBC: 7 10*3/uL (ref 4.0–10.5)

## 2016-11-15 LAB — LDL CHOLESTEROL, DIRECT: LDL DIRECT: 103 mg/dL

## 2016-11-15 LAB — COMPREHENSIVE METABOLIC PANEL
ALT: 19 U/L (ref 0–53)
AST: 18 U/L (ref 0–37)
Albumin: 4.2 g/dL (ref 3.5–5.2)
Alkaline Phosphatase: 48 U/L (ref 39–117)
BILIRUBIN TOTAL: 0.9 mg/dL (ref 0.2–1.2)
BUN: 16 mg/dL (ref 6–23)
CHLORIDE: 103 meq/L (ref 96–112)
CO2: 32 meq/L (ref 19–32)
Calcium: 9.6 mg/dL (ref 8.4–10.5)
Creatinine, Ser: 1.1 mg/dL (ref 0.40–1.50)
GFR: 70.29 mL/min (ref >60.00)
GLUCOSE: 89 mg/dL (ref 70–99)
POTASSIUM: 3.8 meq/L (ref 3.5–5.1)
Sodium: 143 mEq/L (ref 135–145)
Total Protein: 6.3 g/dL (ref 6.0–8.3)

## 2016-11-15 LAB — TSH: TSH: 1.26 u[IU]/mL (ref 0.35–4.50)

## 2016-11-15 MED ORDER — TEMAZEPAM 15 MG PO CAPS: 15.0000 mg | ORAL_CAPSULE | Freq: Every evening | ORAL | 0 refills | Status: DC | PRN

## 2016-11-15 NOTE — Progress Notes (Signed)
Subjective:    Patient ID: Richard Sparks, male    DOB: 03-11-1945, 71 y.o.   MRN: [id number]  HPI 71 year old patient who is seen today for an annual preventive health examination as well as subsequent Medicare wellness visit. Medical problems include a history of hypertension and dyslipidemia. He also has a history of major depression and has been followed by psychiatry. Wellbutrin and clonazepam have been tapered and discontinued.  Colonoscopy October 2015.  History of adenoma  Family history father died at 87 of complications of a massive stroke.  He had diabetes.  Mother is living at 63 One brother and 3 sisters remain well  Social history.  Former professor at [employer]:  1) Amoxicillin (Amoxicillin)   Past History:  Past Medical History:   Depression  Hypertension  Benign prostatic hypertrophy  Hyperlipidemia  Skin cancer  Past Surgical History:   no prior surgeries  colonoscopy 2005 2015  Subsequent Medicare wellness visit  1. Risk factors, based on past  M,S,F history.  cardiovascular risk factors include hypertension and dyslipidemia  2.  Physical activities:remains active.  Is involved in a light weight training, aerobic activity, yoga.  Walks his 2 dogs several times per week  3.  Depression/mood:history of major depression.  Is followed by psychiatry.  Wellbutrin and clonazepam have been tapered and discontinued  4.  Hearing:no deficits  5.  ADL's:independent  6.  Fall risk:low  7.  Home safety:no problems identified  8.  Height weight, and visual acuity;height and weight stable no change in visual acuity  9.  Counseling:continue heart healthy diet and regular exercise  10. Lab orders based on risk factors:laboratory update will be reviewed, including lipid profile  11. Referral :not appropriate at this time.  Will need follow-up colonoscopy in 2 years  29. Care plan:continue efforts at aggressive risk factor modification  13.  Cognitive assessment:   14. Screening: Patient provided with a written and personalized 5-10 year screening schedule in the AVS.    15. Provider List Update: primary care GI, psychiatry and ophthalmology    Review of Systems  Constitutional: Negative for appetite change, chills, fatigue and fever.  HENT: Negative for congestion, dental problem, ear pain, hearing loss, sore throat, tinnitus, trouble swallowing and voice change.   Eyes: Negative for pain, discharge and visual disturbance.  Respiratory: Negative for cough, chest tightness, wheezing and stridor.   Cardiovascular: Negative for chest pain, palpitations and leg swelling.  Gastrointestinal: Negative for abdominal distention, abdominal pain, blood in stool, constipation, diarrhea, nausea and vomiting.  Genitourinary: Negative for difficulty urinating, discharge, flank pain, genital sores, hematuria and urgency.  Musculoskeletal: Negative for arthralgias, back pain, gait problem, joint swelling, myalgias and neck stiffness.  Skin: Negative for rash.  Neurological: Negative for dizziness, syncope, speech difficulty, weakness, numbness and headaches.  Hematological: Negative for adenopathy. Does not bruise/bleed easily.  Psychiatric/Behavioral: Negative for behavioral problems and dysphoric mood. The patient is not nervous/anxious.        Objective:   Physical Exam  Constitutional: He appears well-developed and well-nourished.  HENT:  Head: Normocephalic and atraumatic.  Right Ear: External ear normal.  Left Ear: External ear normal.  Nose: Nose normal.  Mouth/Throat: Oropharynx is clear and moist.  Eyes: Pupils are equal, round, and reactive to light. Conjunctivae and EOM are normal. No scleral icterus.  Neck: Normal range of motion. Neck supple. No JVD present. No thyromegaly present.  Cardiovascular: Regular rhythm, normal heart sounds and intact distal pulses.  Exam reveals no gallop and no friction rub.   No murmur  heard. Pulmonary/Chest: Effort normal and breath sounds normal. He exhibits no tenderness.  Abdominal: Soft. Bowel sounds are normal. He exhibits no distension and no mass. There is no tenderness.  Genitourinary: Penis normal. Rectal exam shows guaiac negative stool.  Genitourinary Comments: Symmetrical prostate enlargement  Musculoskeletal: Normal range of motion. He exhibits no edema or tenderness.  Lymphadenopathy:    He has no cervical adenopathy.  Neurological: He is alert. He has normal reflexes. No cranial nerve deficit. Coordination normal.  Skin: Skin is warm and dry. No rash noted.  Psychiatric: He has a normal mood and affect. His behavior is normal.          Assessment & Plan:   Preventive health examination Subsequent Medicare wellness visit Essential hypertension, well-controlled Dyslipidemia.  Continue statin therapy.  We'll review a lipid profile Major depression.  Clinically stable.  Follow-up psychiatry History colonic polyps.  Follow-up colonoscopy in 2 years BPH stable  Review laboratory studies Medications updated Recheck 6-12 months  Javyn Havlin Pilar Plate

## 2016-11-15 NOTE — Patient Instructions (Addendum)
Limit your sodium (Salt) intake  Please check your blood pressure on a regular basis.  If it is consistently greater than 150/90, please make an office appointment.    It is important that you exercise regularly, at least 20 minutes 3 to 4 times per week.  If you develop chest pain or shortness of breath seek  medical attention.  Return in one year for follow-up  Please see your eye doctor yearly

## 2016-11-22 ENCOUNTER — Other Ambulatory Visit: Payer: Self-pay | Admitting: Internal Medicine

## 2016-11-22 DIAGNOSIS — H2513 Age-related nuclear cataract, bilateral: Secondary | ICD-10-CM | POA: Diagnosis not present

## 2016-11-22 DIAGNOSIS — H43393 Other vitreous opacities, bilateral: Secondary | ICD-10-CM | POA: Diagnosis not present

## 2016-11-24 ENCOUNTER — Encounter: Payer: Self-pay | Admitting: Internal Medicine

## 2017-03-23 ENCOUNTER — Encounter: Payer: Self-pay | Admitting: Family Medicine

## 2017-03-23 ENCOUNTER — Ambulatory Visit (INDEPENDENT_AMBULATORY_CARE_PROVIDER_SITE_OTHER): Payer: Medicare Other | Admitting: Family Medicine

## 2017-03-23 VITALS — BP 110/82 | HR 78 | Temp 97.5°F | Wt 185.0 lb

## 2017-03-23 DIAGNOSIS — M25561 Pain in right knee: Secondary | ICD-10-CM

## 2017-03-23 MED ORDER — NAPROXEN 500 MG PO TABS
500.0000 mg | ORAL_TABLET | Freq: Two times a day (BID) | ORAL | 0 refills | Status: DC
Start: 2017-03-23 — End: 2017-04-03

## 2017-03-23 NOTE — Patient Instructions (Signed)
BEFORE YOU LEAVE: -PF exercises -follow up: 2-4 weeks w/ PCP  Back off on repetitive or strenuous activities involving the knees for a few weeks.  Ice for a few more days a few times per day, then use heat as needed.  Topical sports creams (tiger balm is good) as needed. Naproxen as needed for pain per instructions.  I hope you are feeling better soon! Seek care sooner if your symptoms worsen, new concerns arise or you are not improving with treatment.

## 2017-03-23 NOTE — Progress Notes (Signed)
HPI:  Acute visit for R knee pain: -started after strenuous new workout on elliptical doing intervals a few days ago -onset was slow over next 12 hours -pain is mod, around and superior to R patella -he worked out again the next day in the pool and it made it worse -one aleve helped a little -able to bear weight fine -no fevers, malaise, redness, sig swelling, catching or giving away  ROS: See pertinent positives and negatives per HPI.  Past Medical History:  Diagnosis Date  . Allergy   . BENIGN PROSTATIC HYPERTROPHY 08/01/2006  . DEPRESSION 08/01/2006  . HYPERLIPIDEMIA 08/01/2006  . HYPERTENSION 08/01/2006    Past Surgical History:  Procedure Laterality Date  . COLONOSCOPY    . NO PAST SURGERIES      Family History  Problem Relation Age of Onset  . Colon cancer Neg Hx   . Esophageal cancer Neg Hx   . Rectal cancer Neg Hx   . Stomach cancer Neg Hx     Social History   Socioeconomic History  . Marital status: Married    Spouse name: None  . Number of children: None  . Years of education: None  . Highest education level: None  Social Needs  . Financial resource strain: None  . Food insecurity - worry: None  . Food insecurity - inability: None  . Transportation needs - medical: None  . Transportation needs - non-medical: None  Occupational History  . None  Tobacco Use  . Smoking status: Never Smoker  . Smokeless tobacco: Never Used  Substance and Sexual Activity  . Alcohol use: No    Alcohol/week: 0.0 oz  . Drug use: No  . Sexual activity: None  Other Topics Concern  . None  Social History Narrative  . None     Current Outpatient Medications:  .  b complex vitamins tablet, Take 1 tablet by mouth daily., Disp: , Rfl:  .  Cholecalciferol (VITAMIN D) 1000 UNITS capsule, Take 1,000 Units by mouth daily.  , Disp: , Rfl:  .  Omega-3 Fatty Acids (FISH OIL) 1000 MG CAPS, Take 3 capsules by mouth daily.  , Disp: , Rfl:  .  oxymetazoline (AFRIN) 0.05 % nasal  spray, Place 1 spray into both nostrils 2 (two) times daily as needed. , Disp: , Rfl:  .  pravastatin (PRAVACHOL) 40 MG tablet, TAKE 1 TABLET BY MOUTH  DAILY, Disp: 90 tablet, Rfl: 3 .  temazepam (RESTORIL) 15 MG capsule, Take 1 capsule (15 mg total) by mouth at bedtime as needed for sleep., Disp: 30 capsule, Rfl: 2 .  triamterene-hydrochlorothiazide (MAXZIDE-25) 37.5-25 MG tablet, TAKE 1 TABLET BY MOUTH  EVERY MORNING, Disp: 90 tablet, Rfl: 2 .  naproxen (NAPROSYN) 500 MG tablet, Take 1 tablet (500 mg total) by mouth 2 (two) times daily with a meal., Disp: 30 tablet, Rfl: 0  EXAM:  Vitals:   03/23/17 1430  BP: 110/82  Pulse: 78  Temp: (!) 97.5 F (36.4 C)  SpO2: 99%    Body mass index is 26.54 kg/m.  GENERAL: vitals reviewed and listed above, alert, oriented, appears well hydrated and in no acute distress  HEENT: atraumatic, conjunttiva clear, no obvious abnormalities on inspection of external nose and ears  NECK: no obvious masses on inspection  MS: moves all extremities without noticeable abnormality, on inspection of the knees, perhaps mild edema superior to the patella on the right, no redness or warmth, he has patellar crepitus bilaterally right greater than left, he  has some mild VMO weakness, he has tenderness to palpation around the kneecap on the sore knee, no tenderness elsewhere, negative Lockman, negative valgus varus stress testing, negative drawer testing, negative McMurray, neurovascularly intact distally, his range of motion in the sore knee is preserved  PSYCH: pleasant and cooperative, no obvious depression or anxiety  ASSESSMENT AND PLAN:  Discussed the following assessment and plan:  Acute pain of right knee  -we discussed possible serious and likely etiologies, workup and treatment, treatment risks and return precautions -suspect tendinitis and/or patellofemoral syndrome -after this discussion, Collis opted for symptomatic care at home, home exercises,  modification of activities -follow up advised in 2-4 weeks -of course, we advised Diontay  to return or notify a doctor sooner if symptoms worsen or new concerns arise or if not improving.   Patient Instructions  BEFORE YOU LEAVE: -PF exercises -follow up: 2-4 weeks w/ PCP  Back off on repetitive or strenuous activities involving the knees for a few weeks.  Ice for a few more days a few times per day, then use heat as needed.  Topical sports creams (tiger balm is good) as needed. Naproxen as needed for pain per instructions.  I hope you are feeling better soon! Seek care sooner if your symptoms worsen, new concerns arise or you are not improving with treatment.      Colin Benton R., DO

## 2017-03-30 ENCOUNTER — Telehealth: Payer: Self-pay | Admitting: Internal Medicine

## 2017-03-30 NOTE — Telephone Encounter (Signed)
Se below message, please advise.

## 2017-03-30 NOTE — Telephone Encounter (Signed)
Copied from [place]. Topic: Quick Communication - See Telephone Encounter >> Mar 30, 2017  9:43 AM Conception Chancy, NT wrote: CRM for notification. See Telephone encounter for:  03/30/17.  Pt is calling stating that he would like to get Bupropion refilled. He states his psychiatrist prescribes that but he was trying to get off of it, so he stopped taking it and now he feels like he needs to be on the medication again but does not want to go back to the psychiatrist so he would like to know if Dr. Burnice Logan would send a refill into [facility] order #[id number]. Pt would like a call back if this is denied or if it is sent in. Please advise.

## 2017-03-30 NOTE — Telephone Encounter (Signed)
Okay to refill medication at present dose

## 2017-03-31 ENCOUNTER — Telehealth: Payer: Self-pay | Admitting: *Deleted

## 2017-03-31 NOTE — Telephone Encounter (Signed)
Please advise 

## 2017-03-31 NOTE — Telephone Encounter (Signed)
[facility] faxed a request for a 90-day supply of Naproxen 500mg -30 day supply lastly given by Dr Maudie Mercury on 1/17. Message sent to the pts PCP.

## 2017-04-03 MED ORDER — NAPROXEN 500 MG PO TABS: 500.0000 mg | ORAL_TABLET | Freq: Two times a day (BID) | ORAL | 0 refills | Status: DC

## 2017-04-03 NOTE — Telephone Encounter (Signed)
Per Dr. Sherren Mocha, okay to refill. Medication filled to pharmacy as requested.

## 2017-04-06 ENCOUNTER — Telehealth: Payer: Self-pay | Admitting: *Deleted

## 2017-04-06 ENCOUNTER — Encounter: Payer: Self-pay | Admitting: Family Medicine

## 2017-04-06 ENCOUNTER — Ambulatory Visit (INDEPENDENT_AMBULATORY_CARE_PROVIDER_SITE_OTHER): Payer: Medicare Other | Admitting: Family Medicine

## 2017-04-06 VITALS — BP 120/80 | HR 71 | Temp 97.5°F | Ht 70.0 in | Wt 184.2 lb

## 2017-04-06 DIAGNOSIS — F339 Major depressive disorder, recurrent, unspecified: Secondary | ICD-10-CM | POA: Diagnosis not present

## 2017-04-06 DIAGNOSIS — R319 Hematuria, unspecified: Secondary | ICD-10-CM | POA: Diagnosis not present

## 2017-04-06 DIAGNOSIS — M25561 Pain in right knee: Secondary | ICD-10-CM

## 2017-04-06 LAB — URINALYSIS, MICROSCOPIC ONLY: RBC / HPF: NONE SEEN (ref 0–?)

## 2017-04-06 LAB — POC URINALSYSI DIPSTICK (AUTOMATED)
BILIRUBIN UA: NEGATIVE
Blood, UA: NEGATIVE
Glucose, UA: NEGATIVE
KETONES UA: NEGATIVE
NITRITE UA: NEGATIVE
PH UA: 6 (ref 5.0–8.0)
PROTEIN UA: NEGATIVE
Spec Grav, UA: 1.025 (ref 1.010–1.025)
Urobilinogen, UA: 0.2 E.U./dL

## 2017-04-06 MED ORDER — BUPROPION HCL ER (XL) 150 MG PO TB24: 150.0000 mg | ORAL_TABLET | Freq: Every day | ORAL | 0 refills | Status: DC

## 2017-04-06 NOTE — Progress Notes (Signed)
HPI:  Richard Sparks is a pleasant 72 year old here for follow-up of knee pain.  See office visit notes from 03/23/2017, reviewed today.  He has been doing the home exercises and results his pain has completely resolved.  He denies any pain, swelling, catching or locking.  He is very happy about this.  He does have some questions about the exercises in terms of how to do them and if he should continue them. He also has another concern today of possible hematuria.  Reports he thinks he saw a small amount of blood several days ago.  He has had no symptoms since or prior.  Bleeding, fevers, diarrhea, penile discharge or any other symptoms. He also has a question about his depression medication.  Reports he was seeing a psychiatrist.  However, he reports he called the office a week ago to see if his primary care doctor could manage his medication.  Reports he has not heard back from our office does not receive the refill yet.  He is not out of his medication yet, but wants to make sure this was addressed.  He is on the medication.  ROS: See pertinent positives and negatives per HPI.  Past Medical History:  Diagnosis Date  . Allergy   . BENIGN PROSTATIC HYPERTROPHY 08/01/2006  . DEPRESSION 08/01/2006  . HYPERLIPIDEMIA 08/01/2006  . HYPERTENSION 08/01/2006    Past Surgical History:  Procedure Laterality Date  . COLONOSCOPY    . NO PAST SURGERIES      Family History  Problem Relation Age of Onset  . Colon cancer Neg Hx   . Esophageal cancer Neg Hx   . Rectal cancer Neg Hx   . Stomach cancer Neg Hx     Social History   Socioeconomic History  . Marital status: Married    Spouse name: None  . Number of children: None  . Years of education: None  . Highest education level: None  Social Needs  . Financial resource strain: None  . Food insecurity - worry: None  . Food insecurity - inability: None  . Transportation needs - medical: None  . Transportation needs - non-medical: None   Occupational History  . None  Tobacco Use  . Smoking status: Never Smoker  . Smokeless tobacco: Never Used  Substance and Sexual Activity  . Alcohol use: No    Alcohol/week: 0.0 oz  . Drug use: No  . Sexual activity: None  Other Topics Concern  . None  Social History Narrative  . None     Current Outpatient Medications:  .  b complex vitamins tablet, Take 1 tablet by mouth daily., Disp: , Rfl:  .  Cholecalciferol (VITAMIN D) 1000 UNITS capsule, Take 1,000 Units by mouth daily.  , Disp: , Rfl:  .  Omega-3 Fatty Acids (FISH OIL) 1000 MG CAPS, Take 3 capsules by mouth daily.  , Disp: , Rfl:  .  oxymetazoline (AFRIN) 0.05 % nasal spray, Place 1 spray into both nostrils 2 (two) times daily as needed. , Disp: , Rfl:  .  pravastatin (PRAVACHOL) 40 MG tablet, TAKE 1 TABLET BY MOUTH  DAILY, Disp: 90 tablet, Rfl: 3 .  temazepam (RESTORIL) 15 MG capsule, Take 1 capsule (15 mg total) by mouth at bedtime as needed for sleep., Disp: 30 capsule, Rfl: 2 .  triamterene-hydrochlorothiazide (MAXZIDE-25) 37.5-25 MG tablet, TAKE 1 TABLET BY MOUTH  EVERY MORNING, Disp: 90 tablet, Rfl: 2 .  buPROPion (WELLBUTRIN XL) 150 MG 24 hr tablet, Take 1  tablet (150 mg total) by mouth daily., Disp: 90 tablet, Rfl: 0  EXAM:  Vitals:   04/06/17 0933  BP: 120/80  Pulse: 71  Temp: (!) 97.5 F (36.4 C)    Body mass index is 26.43 kg/m.  GENERAL: vitals reviewed and listed above, alert, oriented, appears well hydrated and in no acute distress  HEENT: atraumatic, conjunttiva clear, no obvious abnormalities on inspection of external nose and ears  NECK: no obvious masses on inspection  LUNGS: clear to auscultation bilaterally, no wheezes, rales or rhonchi, good air movement  CV: HRRR, no peripheral edema  MS: moves all extremities without noticeable abnormality, normal inspection of the knees without any effusion or erythema, mild patellar crepitus and VMO atrophy bilaterally, otherwise normal exam of the  knee  PSYCH: pleasant and cooperative, no obvious depression or anxiety  ASSESSMENT AND PLAN:  Discussed the following assessment and plan:  Acute pain of right knee -I am glad his symptoms have resolved -We went over his exercises in detail, advised that he continue these 2 days a week if he can  Hematuria, unspecified type - Plan: POCT Urinalysis Dipstick (Automated), Culture, Urine, Urine Microscopic Only -Check some urine studies today and also will have him follow-up with his primary doctor in about a month  Depression, recurrent ([place]) -Appears his primary doctor did prove taking over management of his depression, advised my assistant to ensure the refills were granted and advised patient follow-up with his primary doctor  -Patient advised to return or notify a doctor immediately if symptoms worsen or persist or new concerns arise.  Patient Instructions  BEFORE YOU LEAVE: -check on status refill request with PCP -urine dip ---> micro and culture if abnormal -follow up: with PCP in 1 month  Continue the knee exercises 3 days per week.    Lucretia Kern, DO

## 2017-04-06 NOTE — Telephone Encounter (Signed)
Patient was seen today by Dr Maudie Mercury and questioned if a refill was sent for Wellbutrin XL 150mg -takes 1 daily.  I reviewed the chart and noticed the PCP approved a refill.  Refill was sent to [facility] and the pt was informed.

## 2017-04-06 NOTE — Telephone Encounter (Signed)
Verbal Approval was needed for Buproprion XL - this has been approved and taken care of. Nothing further needed.

## 2017-04-06 NOTE — Patient Instructions (Signed)
BEFORE YOU LEAVE: -check on status refill request with PCP -urine dip ---> micro and culture if abnormal -follow up: with PCP in 1 month  Continue the knee exercises 3 days per week.

## 2017-04-06 NOTE — Telephone Encounter (Signed)
   Copied from [place]. Topic: Inquiry >> Apr 06, 2017  9:40 AM Pricilla Handler wrote: Reason for CRM:  Cristie Hem from [facility] [phone]) called requesting a Verbal Approval from Dr. Maudie Mercury for this patient. Cristie Hem would like a call back at [phone]. Ref # for this patient/request is [id number].       Thank You!!!

## 2017-04-10 ENCOUNTER — Telehealth: Payer: Self-pay | Admitting: Internal Medicine

## 2017-04-10 LAB — URINE CULTURE
MICRO NUMBER:: 90133784
SPECIMEN QUALITY: ADEQUATE

## 2017-04-10 MED ORDER — NITROFURANTOIN MONOHYD MACRO 100 MG PO CAPS: 100.0000 mg | ORAL_CAPSULE | Freq: Two times a day (BID) | ORAL | 0 refills | Status: DC

## 2017-04-10 NOTE — Telephone Encounter (Signed)
Copied from [place] [id number]. Topic: General - Other >> Apr 10, 2017  4:14 PM Neva Seat wrote: Pt returned Denice Paradise Anne's missed call.  Please call pt back to speak with him.

## 2017-04-10 NOTE — Addendum Note (Signed)
Addended by: Agnes Lawrence on: 04/10/2017 04:33 PM   Modules accepted: Orders

## 2017-04-10 NOTE — Telephone Encounter (Signed)
See results note. 

## 2017-05-03 ENCOUNTER — Other Ambulatory Visit: Payer: Self-pay | Admitting: Internal Medicine

## 2017-05-15 ENCOUNTER — Other Ambulatory Visit: Payer: Self-pay

## 2017-05-15 ENCOUNTER — Encounter: Payer: Self-pay | Admitting: Internal Medicine

## 2017-05-15 ENCOUNTER — Ambulatory Visit (INDEPENDENT_AMBULATORY_CARE_PROVIDER_SITE_OTHER): Payer: Medicare Other | Admitting: Internal Medicine

## 2017-05-15 VITALS — BP 140/90 | HR 60 | Temp 97.6°F | Wt 185.0 lb

## 2017-05-15 DIAGNOSIS — I10 Essential (primary) hypertension: Secondary | ICD-10-CM | POA: Diagnosis not present

## 2017-05-15 DIAGNOSIS — F339 Major depressive disorder, recurrent, unspecified: Secondary | ICD-10-CM | POA: Diagnosis not present

## 2017-05-15 MED ORDER — TEMAZEPAM 15 MG PO CAPS: 15.0000 mg | ORAL_CAPSULE | Freq: Every evening | ORAL | 0 refills | Status: DC | PRN

## 2017-05-15 NOTE — Patient Instructions (Signed)
  Behavioral health consultation as discussed  Limit your sodium (Salt) intake  Please check your blood pressure on a regular basis.  If it is consistently greater than 150/90, please make an office appointment.    It is important that you exercise regularly, at least 20 minutes 3 to 4 times per week.  If you develop chest pain or shortness of breath seek  medical attention.  Return in 6 months for follow-up

## 2017-05-15 NOTE — Progress Notes (Signed)
Subjective:    Patient ID: Richard Sparks, male    DOB: 01-03-1946, 72 y.o.   MRN: [id number]  HPI  72 year old patient who is seen today in follow-up. He has a history of recurrent depression and is followed by psychiatry.  He has resumed bupropion in mid December and feels that he has improved. He has a history of essential hypertension which has been well controlled. He has insomnia which has been controlled with temazepam as needed.  He was treated recently for a UTI.  This was confirmed with a urine culture.  He has had no further issues. Remains on pravastatin for dyslipidemia.   He was also seen recently for right knee pain.  This was felt to be an overuse type issue and those have resolved.   Past Medical History:  Diagnosis Date  . Allergy   . BENIGN PROSTATIC HYPERTROPHY 08/01/2006  . DEPRESSION 08/01/2006  . HYPERLIPIDEMIA 08/01/2006  . HYPERTENSION 08/01/2006     Social History   Socioeconomic History  . Marital status: Married    Spouse name: Not on file  . Number of children: Not on file  . Years of education: Not on file  . Highest education level: Not on file  Social Needs  . Financial resource strain: Not on file  . Food insecurity - worry: Not on file  . Food insecurity - inability: Not on file  . Transportation needs - medical: Not on file  . Transportation needs - non-medical: Not on file  Occupational History  . Not on file  Tobacco Use  . Smoking status: Never Smoker  . Smokeless tobacco: Never Used  Substance and Sexual Activity  . Alcohol use: No    Alcohol/week: 0.0 oz  . Drug use: No  . Sexual activity: Not on file  Other Topics Concern  . Not on file  Social History Narrative  . Not on file    Past Surgical History:  Procedure Laterality Date  . COLONOSCOPY    . NO PAST SURGERIES      Family History  Problem Relation Age of Onset  . Colon cancer Neg Hx   . Esophageal cancer Neg Hx   . Rectal cancer Neg Hx   . Stomach cancer  Neg Hx     Allergies  Allergen Reactions  . Amoxicillin Rash    Current Outpatient Medications on File Prior to Visit  Medication Sig Dispense Refill  . b complex vitamins tablet Take 1 tablet by mouth daily.    Marland Kitchen buPROPion (WELLBUTRIN XL) 150 MG 24 hr tablet Take 1 tablet (150 mg total) by mouth daily. 90 tablet 0  . Cholecalciferol (VITAMIN D) 1000 UNITS capsule Take 1,000 Units by mouth daily.      . Omega-3 Fatty Acids (FISH OIL) 1000 MG CAPS Take 3 capsules by mouth daily.      Marland Kitchen oxymetazoline (AFRIN) 0.05 % nasal spray Place 1 spray into both nostrils 2 (two) times daily as needed.     . pravastatin (PRAVACHOL) 40 MG tablet TAKE 1 TABLET BY MOUTH  DAILY 90 tablet 3  . triamterene-hydrochlorothiazide (MAXZIDE-25) 37.5-25 MG tablet TAKE 1 TABLET BY MOUTH  EVERY MORNING 90 tablet 2   No current facility-administered medications on file prior to visit.     BP 140/90 (BP Location: Left Arm, Patient Position: Sitting, Cuff Size: Normal)   Pulse 60   Temp 97.6 F (36.4 C) (Oral)   Wt 185 lb (83.9 kg)   SpO2 98%  BMI 26.54 kg/m     Review of Systems  Constitutional: Negative for appetite change, chills, fatigue and fever.  HENT: Negative for congestion, dental problem, ear pain, hearing loss, sore throat, tinnitus, trouble swallowing and voice change.   Eyes: Negative for pain, discharge and visual disturbance.  Respiratory: Negative for cough, chest tightness, wheezing and stridor.   Cardiovascular: Negative for chest pain, palpitations and leg swelling.  Gastrointestinal: Negative for abdominal distention, abdominal pain, blood in stool, constipation, diarrhea, nausea and vomiting.  Genitourinary: Negative for difficulty urinating, discharge, flank pain, genital sores, hematuria and urgency.  Musculoskeletal: Negative for arthralgias, back pain, gait problem, joint swelling, myalgias and neck stiffness.  Skin: Negative for rash.  Neurological: Negative for dizziness,  syncope, speech difficulty, weakness, numbness and headaches.  Hematological: Negative for adenopathy. Does not bruise/bleed easily.  Psychiatric/Behavioral: Positive for dysphoric mood and sleep disturbance. Negative for behavioral problems. The patient is nervous/anxious.        Objective:   Physical Exam  Constitutional: He is oriented to person, place, and time. He appears well-developed.  HENT:  Head: Normocephalic.  Right Ear: External ear normal.  Left Ear: External ear normal.  Eyes: Conjunctivae and EOM are normal.  Neck: Normal range of motion.  Cardiovascular: Normal rate and normal heart sounds.  Pulmonary/Chest: Breath sounds normal.  Abdominal: Bowel sounds are normal.  Musculoskeletal: Normal range of motion. He exhibits no edema or tenderness.  Neurological: He is alert and oriented to person, place, and time.  Psychiatric: He has a normal mood and affect. His behavior is normal.          Assessment & Plan:   Status post UTI.  Presently asymptomatic Right knee pain resolved Recurrent depression.  Follow-up psychiatry.  Patient also given information for behavioral health follow-up Right knee pain resolved essential hypertension. Repeat blood pressure 130/85.  No change in therapy  CPX 6 months  Nyoka Cowden

## 2017-05-24 ENCOUNTER — Other Ambulatory Visit: Payer: Self-pay | Admitting: Internal Medicine

## 2017-05-25 ENCOUNTER — Other Ambulatory Visit: Payer: Self-pay | Admitting: Internal Medicine

## 2017-07-12 ENCOUNTER — Other Ambulatory Visit: Payer: Self-pay | Admitting: Internal Medicine

## 2017-07-12 ENCOUNTER — Ambulatory Visit (INDEPENDENT_AMBULATORY_CARE_PROVIDER_SITE_OTHER): Payer: Medicare Other | Admitting: Licensed Clinical Social Worker

## 2017-07-12 DIAGNOSIS — F3341 Major depressive disorder, recurrent, in partial remission: Secondary | ICD-10-CM | POA: Diagnosis not present

## 2017-08-09 ENCOUNTER — Ambulatory Visit (INDEPENDENT_AMBULATORY_CARE_PROVIDER_SITE_OTHER): Payer: Medicare Other | Admitting: Licensed Clinical Social Worker

## 2017-08-09 DIAGNOSIS — F3341 Major depressive disorder, recurrent, in partial remission: Secondary | ICD-10-CM | POA: Diagnosis not present

## 2017-08-22 ENCOUNTER — Other Ambulatory Visit: Payer: Self-pay | Admitting: Internal Medicine

## 2017-08-23 ENCOUNTER — Ambulatory Visit (INDEPENDENT_AMBULATORY_CARE_PROVIDER_SITE_OTHER): Payer: Medicare Other | Admitting: Licensed Clinical Social Worker

## 2017-08-23 DIAGNOSIS — F3341 Major depressive disorder, recurrent, in partial remission: Secondary | ICD-10-CM

## 2017-09-05 ENCOUNTER — Ambulatory Visit (INDEPENDENT_AMBULATORY_CARE_PROVIDER_SITE_OTHER): Payer: Medicare Other | Admitting: Licensed Clinical Social Worker

## 2017-09-05 DIAGNOSIS — F3341 Major depressive disorder, recurrent, in partial remission: Secondary | ICD-10-CM

## 2017-09-21 ENCOUNTER — Ambulatory Visit (INDEPENDENT_AMBULATORY_CARE_PROVIDER_SITE_OTHER): Payer: Medicare Other | Admitting: Licensed Clinical Social Worker

## 2017-09-21 DIAGNOSIS — F3341 Major depressive disorder, recurrent, in partial remission: Secondary | ICD-10-CM

## 2017-09-30 ENCOUNTER — Other Ambulatory Visit: Payer: Self-pay | Admitting: Internal Medicine

## 2017-10-03 ENCOUNTER — Ambulatory Visit (INDEPENDENT_AMBULATORY_CARE_PROVIDER_SITE_OTHER): Payer: Medicare Other | Admitting: Licensed Clinical Social Worker

## 2017-10-03 DIAGNOSIS — F3341 Major depressive disorder, recurrent, in partial remission: Secondary | ICD-10-CM

## 2017-10-10 ENCOUNTER — Telehealth: Payer: Self-pay | Admitting: Internal Medicine

## 2017-10-10 NOTE — Telephone Encounter (Signed)
Copied from [place]. Topic: Quick Communication - Rx Refill/Question >> Oct 10, 2017 10:48 AM Richard Sparks wrote: Medication: temazepam (RESTORIL) 15 MG capsule  Has the patient contacted their pharmacy? Yes Preferred Pharmacy (with phone number or street name): [facility] [place], [address] [phone] (Phone) [phone] (Fax)

## 2017-10-11 NOTE — Telephone Encounter (Signed)
Rx phoned in at another pharmacy 10/10/2017. Pt aware, no further action needed!

## 2017-10-24 ENCOUNTER — Ambulatory Visit (INDEPENDENT_AMBULATORY_CARE_PROVIDER_SITE_OTHER): Payer: Medicare Other | Admitting: Licensed Clinical Social Worker

## 2017-10-24 DIAGNOSIS — F3341 Major depressive disorder, recurrent, in partial remission: Secondary | ICD-10-CM | POA: Diagnosis not present

## 2017-10-25 ENCOUNTER — Other Ambulatory Visit: Payer: Self-pay | Admitting: Internal Medicine

## 2017-11-08 ENCOUNTER — Ambulatory Visit (INDEPENDENT_AMBULATORY_CARE_PROVIDER_SITE_OTHER): Payer: Medicare Other | Admitting: Licensed Clinical Social Worker

## 2017-11-08 DIAGNOSIS — F3341 Major depressive disorder, recurrent, in partial remission: Secondary | ICD-10-CM

## 2017-11-20 ENCOUNTER — Encounter: Payer: Self-pay | Admitting: Internal Medicine

## 2017-11-20 ENCOUNTER — Ambulatory Visit (INDEPENDENT_AMBULATORY_CARE_PROVIDER_SITE_OTHER): Payer: Medicare Other | Admitting: Internal Medicine

## 2017-11-20 ENCOUNTER — Telehealth: Payer: Self-pay | Admitting: Internal Medicine

## 2017-11-20 VITALS — BP 160/100 | HR 71 | Temp 97.9°F | Ht 70.5 in | Wt 186.8 lb

## 2017-11-20 DIAGNOSIS — I10 Essential (primary) hypertension: Secondary | ICD-10-CM | POA: Diagnosis not present

## 2017-11-20 DIAGNOSIS — F339 Major depressive disorder, recurrent, unspecified: Secondary | ICD-10-CM | POA: Diagnosis not present

## 2017-11-20 DIAGNOSIS — E785 Hyperlipidemia, unspecified: Secondary | ICD-10-CM | POA: Diagnosis not present

## 2017-11-20 DIAGNOSIS — Z0000 Encounter for general adult medical examination without abnormal findings: Secondary | ICD-10-CM | POA: Diagnosis not present

## 2017-11-20 DIAGNOSIS — Z860100 Personal history of colon polyps, unspecified: Secondary | ICD-10-CM

## 2017-11-20 LAB — COMPREHENSIVE METABOLIC PANEL
ALK PHOS: 57 U/L (ref 39–117)
ALT: 19 U/L (ref 0–53)
AST: 17 U/L (ref 0–37)
Albumin: 4.1 g/dL (ref 3.5–5.2)
BUN: 17 mg/dL (ref 6–23)
CO2: 30 meq/L (ref 19–32)
Calcium: 9.4 mg/dL (ref 8.4–10.5)
Chloride: 104 mEq/L (ref 96–112)
Creatinine, Ser: 1.14 mg/dL (ref 0.40–1.50)
GFR: 67.26 mL/min (ref >60.00)
GLUCOSE: 95 mg/dL (ref 70–99)
Potassium: 3.9 mEq/L (ref 3.5–5.1)
SODIUM: 141 meq/L (ref 135–145)
TOTAL PROTEIN: 6.3 g/dL (ref 6.0–8.3)
Total Bilirubin: 0.8 mg/dL (ref 0.2–1.2)

## 2017-11-20 LAB — CBC WITH DIFFERENTIAL/PLATELET
BASOS ABS: 0 10*3/uL (ref 0.0–0.1)
Basophils Relative: 0.7 % (ref 0.0–3.0)
EOS PCT: 3.2 % (ref 0.0–5.0)
Eosinophils Absolute: 0.2 10*3/uL (ref 0.0–0.7)
HCT: 45.5 % (ref 39.0–52.0)
Hemoglobin: 15.9 g/dL (ref 13.0–17.0)
LYMPHS ABS: 1.7 10*3/uL (ref 0.7–4.0)
Lymphocytes Relative: 24.2 % (ref 12.0–46.0)
MCHC: 35 g/dL (ref 30.0–36.0)
MCV: 85.8 fl (ref 78.0–100.0)
MONO ABS: 0.5 10*3/uL (ref 0.1–1.0)
MONOS PCT: 6.3 % (ref 3.0–12.0)
NEUTROS ABS: 4.7 10*3/uL (ref 1.4–7.7)
NEUTROS PCT: 65.6 % (ref 43.0–77.0)
PLATELETS: 235 10*3/uL (ref 150.0–400.0)
RBC: 5.3 Mil/uL (ref 4.22–5.81)
RDW: 12.9 % (ref 11.5–15.5)
WBC: 7.2 10*3/uL (ref 4.0–10.5)

## 2017-11-20 LAB — LDL CHOLESTEROL, DIRECT: LDL DIRECT: 114 mg/dL

## 2017-11-20 LAB — LIPID PANEL
CHOL/HDL RATIO: 5
Cholesterol: 191 mg/dL (ref 0–200)
HDL: 37.7 mg/dL — ABNORMAL LOW (ref >39.00)
NonHDL: 153.25
Triglycerides: 323 mg/dL — ABNORMAL HIGH (ref 0.0–149.0)
VLDL: 64.6 mg/dL — ABNORMAL HIGH (ref 0.0–40.0)

## 2017-11-20 LAB — TSH: TSH: 1.39 u[IU]/mL (ref 0.35–4.50)

## 2017-11-20 MED ORDER — PRAVASTATIN SODIUM 40 MG PO TABS: 40.0000 mg | ORAL_TABLET | Freq: Every day | ORAL | 3 refills | Status: DC

## 2017-11-20 MED ORDER — BUPROPION HCL ER (XL) 150 MG PO TB24: 150.0000 mg | ORAL_TABLET | Freq: Every day | ORAL | 4 refills | Status: DC

## 2017-11-20 MED ORDER — TEMAZEPAM 15 MG PO CAPS: 15.0000 mg | ORAL_CAPSULE | Freq: Every evening | ORAL | 0 refills | Status: DC | PRN

## 2017-11-20 MED ORDER — FLUTICASONE PROPIONATE 50 MCG/ACT NA SUSP: 1.0000 | Freq: Every day | NASAL | 5 refills | Status: AC

## 2017-11-20 MED ORDER — TRIAMTERENE-HCTZ 37.5-25 MG PO TABS: 1.0000 | ORAL_TABLET | Freq: Every morning | ORAL | 2 refills | Status: DC

## 2017-11-20 NOTE — Patient Instructions (Signed)
Limit your sodium (Salt) intake \ Please check your blood pressure on a regular basis.  If it is consistently greater than 150/90, please make an office appointment.    It is important that you exercise regularly, at least 20 minutes 3 to 4 times per week.  If you develop chest pain or shortness of breath seek  medical attention.  Return in one year for follow-up  Schedule your colonoscopy to help detect colon cancer in one year

## 2017-11-20 NOTE — Progress Notes (Addendum)
Subjective:    Patient ID: Richard Sparks, male    DOB: Jun 09, 1945, 72 y.o.   MRN: [id number]  HPI 72 year old patient who is seen today for annual preventive health examination as well as a subsequent Medicare wellness visit He has a history of essential hypertension and dyslipidemia.  He remains on bupropion for depression.  He has been followed by psychiatry but presently is followed regularly by his counselor.  He has a history of colonic polyps and last colonoscopy was in 01/19/2014  Family history father died at 83 of complications of a massive stroke.  He had diabetes.  Mother is living at 68 One brother and 3 sisters remain well  Social history.  Former professor at [employer]:  1) Amoxicillin (Amoxicillin)   Past History:  Past Medical History:   Depression  Hypertension  Benign prostatic hypertrophy  Hyperlipidemia  Skin cancer  Past Surgical History:   no prior surgeries  colonoscopy 2005 2015  Past Medical History:  Diagnosis Date  . Allergy   . BENIGN PROSTATIC HYPERTROPHY 08/01/2006  . DEPRESSION 08/01/2006  . HYPERLIPIDEMIA 08/01/2006  . HYPERTENSION 08/01/2006     Social History   Socioeconomic History  . Marital status: Married    Spouse name: Not on file  . Number of children: Not on file  . Years of education: Not on file  . Highest education level: Not on file  Occupational History  . Not on file  Social Needs  . Financial resource strain: Not on file  . Food insecurity:    Worry: Not on file    Inability: Not on file  . Transportation needs:    Medical: Not on file    Non-medical: Not on file  Tobacco Use  . Smoking status: Never Smoker  . Smokeless tobacco: Never Used  Substance and Sexual Activity  . Alcohol use: No    Alcohol/week: 0.0 standard drinks  . Drug use: No  . Sexual activity: Not on file  Lifestyle  . Physical activity:    Days per week: Not on file    Minutes per session: Not on file  . Stress: Not  on file  Relationships  . Social connections:    Talks on phone: Not on file    Gets together: Not on file    Attends religious service: Not on file    Active member of club or organization: Not on file    Attends meetings of clubs or organizations: Not on file    Relationship status: Not on file  . Intimate partner violence:    Fear of current or ex partner: Not on file    Emotionally abused: Not on file    Physically abused: Not on file    Forced sexual activity: Not on file  Other Topics Concern  . Not on file  Social History Narrative  . Not on file    Past Surgical History:  Procedure Laterality Date  . COLONOSCOPY    . NO PAST SURGERIES      Family History  Problem Relation Age of Onset  . Colon cancer Neg Hx   . Esophageal cancer Neg Hx   . Rectal cancer Neg Hx   . Stomach cancer Neg Hx     Allergies  Allergen Reactions  . Amoxicillin Rash    Current Outpatient Medications on File Prior to Visit  Medication Sig Dispense Refill  . b complex vitamins tablet Take 1 tablet by mouth daily.    Marland Kitchen  Cholecalciferol (VITAMIN D) 1000 UNITS capsule Take 1,000 Units by mouth daily.      Marland Kitchen loratadine (CLARITIN) 10 MG tablet Take 10 mg by mouth daily.    . Omega-3 Fatty Acids (FISH OIL) 1000 MG CAPS Take 3 capsules by mouth daily.       No current facility-administered medications on file prior to visit.     BP (!) 160/100 (BP Location: Right Arm, Patient Position: Sitting, Cuff Size: Large)   Pulse 71   Temp 97.9 F (36.6 C) (Oral)   Ht 5' 10.5" (1.791 m)   Wt 186 lb 12.8 oz (84.7 kg)   SpO2 97%   BMI 26.42 kg/m    1. Risk factors, based on past  M,S,F history.  Cardiovascular risk factors include hypertension and dyslipidemia  2.  Physical activities: Remains active.  Walks his dog frequently occasional yoga and light weight training 3.  Depression/mood: History of major depression this has been stable.  Remains on bupropion and followed by a counselor  regularly  4.  Hearing: No deficits  5.  ADL's: Independent  6.  Fall risk: Low  7.  Home safety: No issues identified  8.  Height weight, and visual acuity; height and weight stable no change in visual acuity is scheduled to see ophthalmology within the next 2 months  9.  Counseling: Continue heart healthy diet and regular exercise  10. Lab orders based on risk factors: We will check laboratory update including lipid profile  11. Referral : Ophthalmology 12. Care plan: Continue efforts at aggressive risk factor modification  13. Cognitive assessment: Alert and appropriate normal affect.  No cognitive dysfunction  14. Screening: Patient provided with a written and personalized 5-10 year screening schedule in the AVS.    15. Provider List Update: Primary care ophthalmology behavioral health and GI    Review of Systems  Constitutional: Negative for appetite change, chills, fatigue and fever.  HENT: Negative for congestion, dental problem, ear pain, hearing loss, sore throat, tinnitus, trouble swallowing and voice change.   Eyes: Negative for pain, discharge and visual disturbance.  Respiratory: Negative for cough, chest tightness, wheezing and stridor.   Cardiovascular: Negative for chest pain, palpitations and leg swelling.  Gastrointestinal: Negative for abdominal distention, abdominal pain, blood in stool, constipation, diarrhea, nausea and vomiting.  Genitourinary: Negative for difficulty urinating, discharge, flank pain, genital sores, hematuria and urgency.  Musculoskeletal: Negative for arthralgias, back pain, gait problem, joint swelling, myalgias and neck stiffness.  Skin: Negative for rash.  Neurological: Negative for dizziness, syncope, speech difficulty, weakness, numbness and headaches.  Hematological: Negative for adenopathy. Does not bruise/bleed easily.  Psychiatric/Behavioral: Negative for behavioral problems and dysphoric mood. The patient is not  nervous/anxious.        Objective:   Physical Exam  Constitutional: He appears well-developed and well-nourished.  Repeat blood pressure 136/82  HENT:  Head: Normocephalic and atraumatic.  Right Ear: External ear normal.  Left Ear: External ear normal.  Nose: Nose normal.  Mouth/Throat: Oropharynx is clear and moist.  Eyes: Pupils are equal, round, and reactive to light. Conjunctivae and EOM are normal. No scleral icterus.  Neck: Normal range of motion. Neck supple. No JVD present. No thyromegaly present.  Cardiovascular: Regular rhythm, normal heart sounds and intact distal pulses. Exam reveals no gallop and no friction rub.  No murmur heard. Pulmonary/Chest: Effort normal and breath sounds normal. He exhibits no tenderness.  Abdominal: Soft. Bowel sounds are normal. He exhibits no distension and no mass.  There is no tenderness.  Genitourinary: Penis normal. Rectal exam shows guaiac negative stool.  Genitourinary Comments: Prostate symmetrically enlarged  Musculoskeletal: Normal range of motion. He exhibits no edema or tenderness.  Lymphadenopathy:    He has no cervical adenopathy.  Neurological: He is alert. He has normal reflexes. No cranial nerve deficit. Coordination normal.  Skin: Skin is warm and dry. No rash noted.  Psychiatric: He has a normal mood and affect. His behavior is normal.          Assessment & Plan:   Preventive health examination Subsequent Medicare wellness visit Essential hypertension stable.  No change in medical regimen History of depression stable History of colonic polyps.  Will need follow-up colonoscopy in 1 year dyslipidemia BPH  The patient will follow-up with a new provider in 6 to 12 months Laboratory update reviewed  Marletta Lor

## 2017-11-20 NOTE — Telephone Encounter (Signed)
Copied from [place] [id number]. Topic: Quick Communication - Rx Refill/Question >> Nov 20, 2017  3:50 PM Gardiner Ramus wrote: Medication: buPROPion (WELLBUTRIN XL) 150 MG 24 hr tablet [[id number]] pravastatin (PRAVACHOL) 40 MG tablet [[id number]] triamterene-hydrochlorothiazide (MAXZIDE-25) 37.5-25 MG tablet [[id number]] sent to wrong pharmacy   Has the patient contacted their pharmacy? yes Preferred Pharmacy (with phone number or street name):[place], [place] [phone] (Phone) [phone] (Fax)    Agent: Please be advised that RX refills may take up to 3 business days. We ask that you follow-up with your pharmacy.

## 2017-11-21 ENCOUNTER — Ambulatory Visit (INDEPENDENT_AMBULATORY_CARE_PROVIDER_SITE_OTHER): Payer: Medicare Other | Admitting: Licensed Clinical Social Worker

## 2017-11-21 DIAGNOSIS — F3341 Major depressive disorder, recurrent, in partial remission: Secondary | ICD-10-CM | POA: Diagnosis not present

## 2017-11-21 LAB — HEPATITIS C ANTIBODY
Hepatitis C Ab: NONREACTIVE
SIGNAL TO CUT-OFF: 0.01 (ref <1.00)

## 2017-12-05 ENCOUNTER — Ambulatory Visit (INDEPENDENT_AMBULATORY_CARE_PROVIDER_SITE_OTHER): Payer: Medicare Other | Admitting: Licensed Clinical Social Worker

## 2017-12-05 DIAGNOSIS — F3341 Major depressive disorder, recurrent, in partial remission: Secondary | ICD-10-CM | POA: Diagnosis not present

## 2017-12-08 DIAGNOSIS — H2513 Age-related nuclear cataract, bilateral: Secondary | ICD-10-CM | POA: Diagnosis not present

## 2017-12-19 ENCOUNTER — Ambulatory Visit (INDEPENDENT_AMBULATORY_CARE_PROVIDER_SITE_OTHER): Payer: Medicare Other | Admitting: Licensed Clinical Social Worker

## 2017-12-19 DIAGNOSIS — F3341 Major depressive disorder, recurrent, in partial remission: Secondary | ICD-10-CM | POA: Diagnosis not present

## 2018-01-02 ENCOUNTER — Ambulatory Visit (INDEPENDENT_AMBULATORY_CARE_PROVIDER_SITE_OTHER): Payer: Medicare Other | Admitting: Licensed Clinical Social Worker

## 2018-01-02 DIAGNOSIS — F3341 Major depressive disorder, recurrent, in partial remission: Secondary | ICD-10-CM | POA: Diagnosis not present

## 2018-01-08 ENCOUNTER — Other Ambulatory Visit: Payer: Self-pay | Admitting: Internal Medicine

## 2018-01-25 ENCOUNTER — Ambulatory Visit (INDEPENDENT_AMBULATORY_CARE_PROVIDER_SITE_OTHER): Payer: Medicare Other | Admitting: Licensed Clinical Social Worker

## 2018-01-25 DIAGNOSIS — F3341 Major depressive disorder, recurrent, in partial remission: Secondary | ICD-10-CM

## 2018-02-08 ENCOUNTER — Ambulatory Visit: Payer: Medicare Other | Admitting: Licensed Clinical Social Worker

## 2018-02-08 DIAGNOSIS — F3341 Major depressive disorder, recurrent, in partial remission: Secondary | ICD-10-CM

## 2018-02-22 ENCOUNTER — Ambulatory Visit (INDEPENDENT_AMBULATORY_CARE_PROVIDER_SITE_OTHER): Payer: Medicare Other | Admitting: Licensed Clinical Social Worker

## 2018-02-22 DIAGNOSIS — F3341 Major depressive disorder, recurrent, in partial remission: Secondary | ICD-10-CM

## 2018-03-22 ENCOUNTER — Ambulatory Visit: Payer: Medicare Other | Admitting: Licensed Clinical Social Worker

## 2018-03-22 DIAGNOSIS — F3341 Major depressive disorder, recurrent, in partial remission: Secondary | ICD-10-CM | POA: Diagnosis not present

## 2018-04-05 ENCOUNTER — Ambulatory Visit: Payer: Medicare Other | Admitting: Licensed Clinical Social Worker

## 2018-04-05 DIAGNOSIS — F3341 Major depressive disorder, recurrent, in partial remission: Secondary | ICD-10-CM

## 2018-04-22 ENCOUNTER — Other Ambulatory Visit: Payer: Self-pay | Admitting: Internal Medicine

## 2018-04-26 ENCOUNTER — Other Ambulatory Visit: Payer: Self-pay | Admitting: *Deleted

## 2018-04-26 ENCOUNTER — Ambulatory Visit (INDEPENDENT_AMBULATORY_CARE_PROVIDER_SITE_OTHER): Payer: Medicare Other | Admitting: Licensed Clinical Social Worker

## 2018-04-26 DIAGNOSIS — F3341 Major depressive disorder, recurrent, in partial remission: Secondary | ICD-10-CM | POA: Diagnosis not present

## 2018-04-26 MED ORDER — TRIAMTERENE-HCTZ 37.5-25 MG PO TABS: 1.0000 | ORAL_TABLET | Freq: Every morning | ORAL | 1 refills | Status: DC

## 2018-05-10 ENCOUNTER — Ambulatory Visit (INDEPENDENT_AMBULATORY_CARE_PROVIDER_SITE_OTHER): Payer: Medicare Other | Admitting: Licensed Clinical Social Worker

## 2018-05-10 DIAGNOSIS — F3341 Major depressive disorder, recurrent, in partial remission: Secondary | ICD-10-CM | POA: Diagnosis not present

## 2018-05-24 ENCOUNTER — Ambulatory Visit: Payer: Medicare Other | Admitting: Licensed Clinical Social Worker

## 2018-05-24 DIAGNOSIS — F3341 Major depressive disorder, recurrent, in partial remission: Secondary | ICD-10-CM

## 2018-06-07 ENCOUNTER — Ambulatory Visit: Payer: Medicare Other | Admitting: Licensed Clinical Social Worker

## 2018-06-21 ENCOUNTER — Ambulatory Visit: Payer: Medicare Other | Admitting: Licensed Clinical Social Worker

## 2018-07-05 ENCOUNTER — Ambulatory Visit: Payer: Medicare Other | Admitting: Licensed Clinical Social Worker

## 2018-07-18 ENCOUNTER — Ambulatory Visit: Payer: Medicare Other | Admitting: Licensed Clinical Social Worker

## 2018-07-20 ENCOUNTER — Other Ambulatory Visit: Payer: Self-pay

## 2018-07-20 ENCOUNTER — Telehealth: Payer: Self-pay | Admitting: Internal Medicine

## 2018-07-20 ENCOUNTER — Ambulatory Visit (INDEPENDENT_AMBULATORY_CARE_PROVIDER_SITE_OTHER): Payer: Medicare Other | Admitting: Internal Medicine

## 2018-07-20 DIAGNOSIS — E785 Hyperlipidemia, unspecified: Secondary | ICD-10-CM

## 2018-07-20 DIAGNOSIS — I10 Essential (primary) hypertension: Secondary | ICD-10-CM

## 2018-07-20 DIAGNOSIS — F339 Major depressive disorder, recurrent, unspecified: Secondary | ICD-10-CM

## 2018-07-20 DIAGNOSIS — N400 Enlarged prostate without lower urinary tract symptoms: Secondary | ICD-10-CM

## 2018-07-20 DIAGNOSIS — G4700 Insomnia, unspecified: Secondary | ICD-10-CM

## 2018-07-20 MED ORDER — TEMAZEPAM 15 MG PO CAPS: 15.0000 mg | ORAL_CAPSULE | Freq: Every evening | ORAL | 0 refills | Status: DC | PRN

## 2018-07-20 NOTE — Progress Notes (Signed)
Virtual Visit via Video Note  I connected with Richard Sparks on 07/20/18 at  1:30 PM EDT by a video enabled telemedicine application and verified that I am speaking with the correct person using two identifiers.  Location patient: home Location provider: work office Persons participating in the virtual visit: patient, provider  I discussed the limitations of evaluation and management by telemedicine and the availability of in person appointments. The patient expressed understanding and agreed to proceed.   HPI: This is a scheduled visit to establish care and to follow-up on chronic medical conditions.  He had his annual physical in September 2019.  His past medical history significant for:  1.  Hypertension that has been well controlled on triamterene/hydrochlorothiazide.  2.  Hyperlipidemia on pravastatin.  With an LDL of 114 in September 2019.  3.  Depression that is well controlled on bupropion he has a Social worker who he sees routinely.  4.  Insomnia, states prior PCP tried several different medications until they found that temazepam seems to work best for him.  He has a never smoker, never drinks alcohol.   ROS: Constitutional: Denies fever, chills, diaphoresis, appetite change and fatigue.  HEENT: Denies photophobia, eye pain, redness, hearing loss, ear pain, congestion, sore throat, rhinorrhea, sneezing, mouth sores, trouble swallowing, neck pain, neck stiffness and tinnitus.   Respiratory: Denies SOB, DOE, cough, chest tightness,  and wheezing.   Cardiovascular: Denies chest pain, palpitations and leg swelling.  Gastrointestinal: Denies nausea, vomiting, abdominal pain, diarrhea, constipation, blood in stool and abdominal distention.  Genitourinary: Denies dysuria, urgency, frequency, hematuria, flank pain and difficulty urinating.  Endocrine: Denies: hot or cold intolerance, sweats, changes in hair or nails, polyuria, polydipsia. Musculoskeletal: Denies myalgias, back  pain, joint swelling, arthralgias and gait problem.  Skin: Denies pallor, rash and wound.  Neurological: Denies dizziness, seizures, syncope, weakness, light-headedness, numbness and headaches.  Hematological: Denies adenopathy. Easy bruising, personal or family bleeding history  Psychiatric/Behavioral: Denies suicidal ideation, mood changes, confusion, nervousness, sleep disturbance and agitation   Past Medical History:  Diagnosis Date  . Allergy   . BENIGN PROSTATIC HYPERTROPHY 08/01/2006  . DEPRESSION 08/01/2006  . HYPERLIPIDEMIA 08/01/2006  . HYPERTENSION 08/01/2006    Past Surgical History:  Procedure Laterality Date  . COLONOSCOPY    . NO PAST SURGERIES      Family History  Problem Relation Age of Onset  . Colon cancer Neg Hx   . Esophageal cancer Neg Hx   . Rectal cancer Neg Hx   . Stomach cancer Neg Hx     SOCIAL HX:   reports that he has never smoked. He has never used smokeless tobacco. He reports that he does not drink alcohol or use drugs.   Current Outpatient Medications:  .  b complex vitamins tablet, Take 1 tablet by mouth daily., Disp: , Rfl:  .  buPROPion (WELLBUTRIN XL) 150 MG 24 hr tablet, TAKE 1 TABLET BY MOUTH  DAILY, Disp: 90 tablet, Rfl: 4 .  Cholecalciferol (VITAMIN D) 1000 UNITS capsule, Take 1,000 Units by mouth daily.  , Disp: , Rfl:  .  fluticasone (FLONASE) 50 MCG/ACT nasal spray, Place 1 spray into both nostrils daily., Disp: 16 g, Rfl: 5 .  loratadine (CLARITIN) 10 MG tablet, Take 10 mg by mouth daily., Disp: , Rfl:  .  Omega-3 Fatty Acids (FISH OIL) 1000 MG CAPS, Take 3 capsules by mouth daily.  , Disp: , Rfl:  .  pravastatin (PRAVACHOL) 40 MG tablet, TAKE 1  TABLET BY MOUTH  DAILY, Disp: 90 tablet, Rfl: 3 .  temazepam (RESTORIL) 15 MG capsule, Take 1 capsule (15 mg total) by mouth at bedtime as needed. for sleep, Disp: 30 capsule, Rfl: 0 .  triamterene-hydrochlorothiazide (MAXZIDE-25) 37.5-25 MG tablet, Take 1 tablet by mouth every morning. Must  keep appointment with new provider for more refills, Disp: 90 tablet, Rfl: 1  EXAM:   VITALS per patient if applicable: None reported  GENERAL: alert, oriented, appears well and in no acute distress  HEENT: atraumatic, conjunttiva clear, no obvious abnormalities on inspection of external nose and ears, wears corrective lenses  NECK: normal movements of the head and neck  LUNGS: on inspection no signs of respiratory distress, breathing rate appears normal, no obvious gross increased work of breathing, gasping or wheezing  CV: no obvious cyanosis  MS: moves all visible extremities without noticeable abnormality  PSYCH/NEURO: pleasant and cooperative, no obvious depression or anxiety, speech and thought processing grossly intact  ASSESSMENT AND PLAN:   Dyslipidemia -On pravastatin, recheck lipids during physical.  Depression, recurrent (HCC) -Mood appears to be stable on bupropion, he also follows with his counselor routinely.  Essential hypertension -Has been well controlled on triamterene/hydrochlorothiazide. -Have advised more frequent ambulatory blood pressure monitoring.  Benign prostatic hyperplasia without lower urinary tract symptoms -Does not appear to be very symptomatic.  Insomnia, unspecified type -Will refill temazepam today.    I discussed the assessment and treatment plan with the patient. The patient was provided an opportunity to ask questions and all were answered. The patient agreed with the plan and demonstrated an understanding of the instructions.   The patient was advised to call back or seek an in-person evaluation if the symptoms worsen or if the condition fails to improve as anticipated.    [name], MD  [facility]

## 2018-07-20 NOTE — Telephone Encounter (Signed)
Copied from [place] [id number]. Topic: Quick Communication - Rx Refill/Question >> Jul 20, 2018  9:24 AM [name] wrote: Medication: temazepam (RESTORIL) 15 MG capsule  Has the patient contacted their pharmacy? Yes (Agent: If no, request that the patient contact the pharmacy for the refill.) (Agent: If yes, when and what did the pharmacy advise?)Contact PCP  Preferred Pharmacy (with phone number or street name): [facility] [place], [place] [address] [phone] (Phone) [phone] (Fax)    Agent: Please be advised that RX refills may take up to 3 business days. We ask that you follow-up with your pharmacy.

## 2018-07-20 NOTE — Telephone Encounter (Signed)
Virtual visit scheduled for 07/20/2018

## 2018-08-09 ENCOUNTER — Ambulatory Visit: Payer: Medicare Other | Admitting: Licensed Clinical Social Worker

## 2018-10-23 ENCOUNTER — Other Ambulatory Visit: Payer: Self-pay | Admitting: *Deleted

## 2018-10-23 MED ORDER — TRIAMTERENE-HCTZ 37.5-25 MG PO TABS: 1.0000 | ORAL_TABLET | Freq: Every morning | ORAL | 1 refills | Status: DC

## 2018-11-22 ENCOUNTER — Ambulatory Visit (INDEPENDENT_AMBULATORY_CARE_PROVIDER_SITE_OTHER): Payer: Medicare Other | Admitting: Internal Medicine

## 2018-11-22 ENCOUNTER — Encounter: Payer: Self-pay | Admitting: Internal Medicine

## 2018-11-22 ENCOUNTER — Other Ambulatory Visit: Payer: Self-pay

## 2018-11-22 VITALS — BP 120/80 | HR 63 | Temp 98.3°F | Ht 71.0 in | Wt 180.8 lb

## 2018-11-22 DIAGNOSIS — N400 Enlarged prostate without lower urinary tract symptoms: Secondary | ICD-10-CM | POA: Diagnosis not present

## 2018-11-22 DIAGNOSIS — E785 Hyperlipidemia, unspecified: Secondary | ICD-10-CM | POA: Diagnosis not present

## 2018-11-22 DIAGNOSIS — I10 Essential (primary) hypertension: Secondary | ICD-10-CM

## 2018-11-22 DIAGNOSIS — G4700 Insomnia, unspecified: Secondary | ICD-10-CM | POA: Diagnosis not present

## 2018-11-22 DIAGNOSIS — Z0000 Encounter for general adult medical examination without abnormal findings: Secondary | ICD-10-CM | POA: Diagnosis not present

## 2018-11-22 DIAGNOSIS — Z86010 Personal history of colonic polyps: Secondary | ICD-10-CM | POA: Diagnosis not present

## 2018-11-22 LAB — LIPID PANEL
Cholesterol: 227 mg/dL — ABNORMAL HIGH (ref 0–200)
HDL: 45.6 mg/dL (ref >39.00)
NonHDL: 181.87
Total CHOL/HDL Ratio: 5
Triglycerides: 336 mg/dL — ABNORMAL HIGH (ref 0.0–149.0)
VLDL: 67.2 mg/dL — ABNORMAL HIGH (ref 0.0–40.0)

## 2018-11-22 LAB — COMPREHENSIVE METABOLIC PANEL
ALT: 23 U/L (ref 0–53)
AST: 20 U/L (ref 0–37)
Albumin: 4.4 g/dL (ref 3.5–5.2)
Alkaline Phosphatase: 59 U/L (ref 39–117)
BUN: 19 mg/dL (ref 6–23)
CO2: 29 mEq/L (ref 19–32)
Calcium: 10.1 mg/dL (ref 8.4–10.5)
Chloride: 103 mEq/L (ref 96–112)
Creatinine, Ser: 1.22 mg/dL (ref 0.40–1.50)
GFR: 58.35 mL/min — ABNORMAL LOW (ref >60.00)
Glucose, Bld: 95 mg/dL (ref 70–99)
Potassium: 3.8 mEq/L (ref 3.5–5.1)
Sodium: 140 mEq/L (ref 135–145)
Total Bilirubin: 0.9 mg/dL (ref 0.2–1.2)
Total Protein: 6.7 g/dL (ref 6.0–8.3)

## 2018-11-22 LAB — TSH: TSH: 1.49 u[IU]/mL (ref 0.35–4.50)

## 2018-11-22 LAB — VITAMIN D 25 HYDROXY (VIT D DEFICIENCY, FRACTURES): VITD: 78.03 ng/mL (ref 30.00–100.00)

## 2018-11-22 LAB — LDL CHOLESTEROL, DIRECT: Direct LDL: 139 mg/dL

## 2018-11-22 LAB — CBC WITH DIFFERENTIAL/PLATELET
Basophils Absolute: 0.1 10*3/uL (ref 0.0–0.1)
Basophils Relative: 1.1 % (ref 0.0–3.0)
Eosinophils Absolute: 0.2 10*3/uL (ref 0.0–0.7)
Eosinophils Relative: 2.6 % (ref 0.0–5.0)
HCT: 49.1 % (ref 39.0–52.0)
Hemoglobin: 17.3 g/dL — ABNORMAL HIGH (ref 13.0–17.0)
Lymphocytes Relative: 28 % (ref 12.0–46.0)
Lymphs Abs: 2.1 10*3/uL (ref 0.7–4.0)
MCHC: 35.2 g/dL (ref 30.0–36.0)
MCV: 87.7 fl (ref 78.0–100.0)
Monocytes Absolute: 0.5 10*3/uL (ref 0.1–1.0)
Monocytes Relative: 6.9 % (ref 3.0–12.0)
Neutro Abs: 4.7 10*3/uL (ref 1.4–7.7)
Neutrophils Relative %: 61.4 % (ref 43.0–77.0)
Platelets: 234 10*3/uL (ref 150.0–400.0)
RBC: 5.59 Mil/uL (ref 4.22–5.81)
RDW: 13 % (ref 11.5–15.5)
WBC: 7.6 10*3/uL (ref 4.0–10.5)

## 2018-11-22 LAB — HEMOGLOBIN A1C: Hgb A1c MFr Bld: 5.6 % (ref 4.6–6.5)

## 2018-11-22 LAB — VITAMIN B12: Vitamin B-12: 688 pg/mL (ref 211–911)

## 2018-11-22 MED ORDER — TEMAZEPAM 15 MG PO CAPS: 15.0000 mg | ORAL_CAPSULE | Freq: Every evening | ORAL | 1 refills | Status: DC | PRN

## 2018-11-22 NOTE — Progress Notes (Signed)
Established Patient Office Visit     CC/Reason for Visit: Annual preventive exam and subsequent Medicare wellness visit  HPI: Richard Sparks is a 73 y.o. male who is coming in today for the above mentioned reasons. Past Medical History is significant for:   1.  Hypertension that has been well controlled on triamterene/hydrochlorothiazide.  2.  Hyperlipidemia on pravastatin.  With an LDL of 114 in September 2019.  3.  Depression that is well controlled on bupropion he has a Social worker who he sees routinely.  4.  Insomnia, states prior PCP tried several different medications until they found that temazepam seems to work best for him.  He has no acute complaints today.  He has routine eye and dental care.  Does not perceive any hearing difficulty, he walks every day.  He is overdue for screening colonoscopy.  He has questions about routine prostate cancer screening.   Past Medical/Surgical History: Past Medical History:  Diagnosis Date   Allergy    BENIGN PROSTATIC HYPERTROPHY 08/01/2006   DEPRESSION 08/01/2006   HYPERLIPIDEMIA 08/01/2006   HYPERTENSION 08/01/2006    Past Surgical History:  Procedure Laterality Date   COLONOSCOPY     NO PAST SURGERIES      Social History:  reports that he has never smoked. He has never used smokeless tobacco. He reports that he does not drink alcohol or use drugs.  Allergies: Allergies  Allergen Reactions   Amoxicillin Rash    Family History:  Family History  Problem Relation Age of Onset   Colon cancer Neg Hx    Esophageal cancer Neg Hx    Rectal cancer Neg Hx    Stomach cancer Neg Hx      Current Outpatient Medications:    b complex vitamins tablet, Take 1 tablet by mouth daily., Disp: , Rfl:    buPROPion (WELLBUTRIN XL) 150 MG 24 hr tablet, TAKE 1 TABLET BY MOUTH  DAILY, Disp: 90 tablet, Rfl: 4   Cholecalciferol (VITAMIN D) 1000 UNITS capsule, Take 1,000 Units by mouth daily.  , Disp: , Rfl:     fluticasone (FLONASE) 50 MCG/ACT nasal spray, Place 1 spray into both nostrils daily., Disp: 16 g, Rfl: 5   loratadine (CLARITIN) 10 MG tablet, Take 10 mg by mouth daily., Disp: , Rfl:    Omega-3 Fatty Acids (FISH OIL) 1000 MG CAPS, Take 3 capsules by mouth daily.  , Disp: , Rfl:    pravastatin (PRAVACHOL) 40 MG tablet, TAKE 1 TABLET BY MOUTH  DAILY, Disp: 90 tablet, Rfl: 3   temazepam (RESTORIL) 15 MG capsule, Take 1 capsule (15 mg total) by mouth at bedtime as needed. for sleep, Disp: 90 capsule, Rfl: 1   triamterene-hydrochlorothiazide (MAXZIDE-25) 37.5-25 MG tablet, Take 1 tablet by mouth every morning. Must keep appointment with new provider for more refills, Disp: 90 tablet, Rfl: 1  Review of Systems:  Constitutional: Denies fever, chills, diaphoresis, appetite change and fatigue.  HEENT: Denies photophobia, eye pain, redness, hearing loss, ear pain, congestion, sore throat, rhinorrhea, sneezing, mouth sores, trouble swallowing, neck pain, neck stiffness and tinnitus.   Respiratory: Denies SOB, DOE, cough, chest tightness,  and wheezing.   Cardiovascular: Denies chest pain, palpitations and leg swelling.  Gastrointestinal: Denies nausea, vomiting, abdominal pain, diarrhea, constipation, blood in stool and abdominal distention.  Genitourinary: Denies dysuria, urgency, frequency, hematuria, flank pain and difficulty urinating.  Endocrine: Denies: hot or cold intolerance, sweats, changes in hair or nails, polyuria, polydipsia. Musculoskeletal: Denies myalgias, back  pain, joint swelling, arthralgias and gait problem.  Skin: Denies pallor, rash and wound.  Neurological: Denies dizziness, seizures, syncope, weakness, light-headedness, numbness and headaches.  Hematological: Denies adenopathy. Easy bruising, personal or family bleeding history  Psychiatric/Behavioral: Denies suicidal ideation, mood changes, confusion, nervousness, sleep disturbance and agitation    Physical Exam: Vitals:     11/22/18 0937  BP: 120/80  Pulse: 63  Temp: 98.3 F (36.8 C)  TempSrc: Temporal  SpO2: 98%  Weight: 180 lb 12.8 oz (82 kg)  Height: 5' 11"  (1.803 m)    Body mass index is 25.22 kg/m.   Constitutional: NAD, calm, comfortable Eyes: PERRL, lids and conjunctivae normal ENMT: Mucous membranes are moist. Posterior pharynx clear of any exudate or lesions. Normal dentition. Tympanic membrane is pearly white, no erythema or bulging. Neck: normal, supple, no masses, no thyromegaly Respiratory: clear to auscultation bilaterally, no wheezing, no crackles. Normal respiratory effort. No accessory muscle use.  Cardiovascular: Regular rate and rhythm, no murmurs / rubs / gallops. No extremity edema. 2+ pedal pulses. No carotid bruits.  Abdomen: no tenderness, no masses palpated. No hepatosplenomegaly. Bowel sounds positive.  Musculoskeletal: no clubbing / cyanosis. No joint deformity upper and lower extremities. Good ROM, no contractures. Normal muscle tone.  Skin: no rashes, lesions, ulcers. No induration Neurologic: CN 2-12 grossly intact. Sensation intact, DTR normal. Strength 5/5 in all 4.  Psychiatric: Normal judgment and insight. Alert and oriented x 3. Normal mood.    Subsequent Medicare wellness visit   1. Risk factors, based on past  M,S,F -cardiovascular disease risk factors include age, gender, history of hyperlipidemia, history of hypertension   2.  Physical activities: Walks every day   3.  Depression/mood:  Has a history of depression although his mood is not depressed today   4.  Hearing:  No issues   5.  ADL's: Independent in all ADLs   6.  Fall risk:  Low fall risk   7.  Home safety: No problems identified   8.  Height weight, and visual acuity: Height and weight as above, visual acuity is 20/32 on the left, 20/40 on the right and 20/32 with both eyes   9.  Counseling:  Have advised to reconsider flu vaccination   10. Lab orders based on risk factors: Laboratory  update will be reviewed   11. Referral :  GI as he is due for screening colonoscopy   12. Care plan:  Follow-up with me in 6 months   13. Cognitive assessment:  No cognitive impairment   14. Screening: Patient provided with a written and personalized 5-10 year screening schedule in the AVS.   yes   15. Provider List Update:   PCP, ophthalmologist, dentist  16. Advance Directives: Full code     Office Visit from 11/22/2018 in [facility] [place]  PHQ-9 Total Score  4      Fall Risk  11/22/2018 11/20/2017 11/10/2015 11/05/2014 04/30/2014  Falls in the past year? 0 No No No No  Number falls in past yr: 0 - - - -  Injury with Fall? 0 - - - -     Impression and Plan:  Encounter for preventive health examination  -He has routine eye and dental care. -Have advised flu vaccine which she has declined, otherwise immunizations are up-to-date. -Have discussed healthy lifestyle in detail. -Screening labs to be performed today. -He is now due for his 5-year screening colonoscopy, will refer to GI. -We have discussed prostate cancer screening and  how under current guidelines it is not routinely recommended.  After frank discussion, we have elected not to pursue prostate cancer screening today.  Insomnia, unspecified type  -Refill temazepam. -He is wondering about another sleeping aid that he could take midmorning if he wakes up and have difficulty falling asleep.  I have discussed how typically sleeping aids are not recommended to be taken after 10 pm as the effects can spill into the next morning.  He states that he had tried Sonata in the past and actually stopped using it because it only lasted a few hours for him anyways.  He would like to consider Sonata prescription.  I have advised that I will consider this.  Essential hypertension  -Well-controlled on current regimen.  Benign prostatic hyperplasia without lower urinary tract symptoms -Noted, not on alpha  blockers.  History of colonic polyps -He is now due for redo colonoscopy, will send GI referral.  Dyslipidemia  -Last LDL was 114 in September 2019, continue pravastatin, recheck lipids today.    Patient Instructions  -Nice seeing you today!!  -Lab work today; will notify you once results are available.  -Flu vaccine today.  -Schedule follow up in 4 months.   Preventive Care 13 Years and Older, Male Preventive care refers to lifestyle choices and visits with your health care provider that can promote health and wellness. This includes:  A yearly physical exam. This is also called an annual well check.  Regular dental and eye exams.  Immunizations.  Screening for certain conditions.  Healthy lifestyle choices, such as diet and exercise. What can I expect for my preventive care visit? Physical exam Your health care provider will check:  Height and weight. These may be used to calculate body mass index (BMI), which is a measurement that tells if you are at a healthy weight.  Heart rate and blood pressure.  Your skin for abnormal spots. Counseling Your health care provider may ask you questions about:  Alcohol, tobacco, and drug use.  Emotional well-being.  Home and relationship well-being.  Sexual activity.  Eating habits.  History of falls.  Memory and ability to understand (cognition).  Work and work Statistician. What immunizations do I need?  Influenza (flu) vaccine  This is recommended every year. Tetanus, diphtheria, and pertussis (Tdap) vaccine  You may need a Td booster every 10 years. Varicella (chickenpox) vaccine  You may need this vaccine if you have not already been vaccinated. Zoster (shingles) vaccine  You may need this after age 36. Pneumococcal conjugate (PCV13) vaccine  One dose is recommended after age 92. Pneumococcal polysaccharide (PPSV23) vaccine  One dose is recommended after age 29. Measles, mumps, and rubella (MMR)  vaccine  You may need at least one dose of MMR if you were born in 1957 or later. You may also need a second dose. Meningococcal conjugate (MenACWY) vaccine  You may need this if you have certain conditions. Hepatitis A vaccine  You may need this if you have certain conditions or if you travel or work in places where you may be exposed to hepatitis A. Hepatitis B vaccine  You may need this if you have certain conditions or if you travel or work in places where you may be exposed to hepatitis B. Haemophilus influenzae type b (Hib) vaccine  You may need this if you have certain conditions. You may receive vaccines as individual doses or as more than one vaccine together in one shot (combination vaccines). Talk with your health care provider  about the risks and benefits of combination vaccines. What tests do I need? Blood tests  Lipid and cholesterol levels. These may be checked every 5 years, or more frequently depending on your overall health.  Hepatitis C test.  Hepatitis B test. Screening  Lung cancer screening. You may have this screening every year starting at age 58 if you have a 30-pack-year history of smoking and currently smoke or have quit within the past 15 years.  Colorectal cancer screening. All adults should have this screening starting at age 56 and continuing until age 46. Your health care provider may recommend screening at age 52 if you are at increased risk. You will have tests every 1-10 years, depending on your results and the type of screening test.  Prostate cancer screening. Recommendations will vary depending on your family history and other risks.  Diabetes screening. This is done by checking your blood sugar (glucose) after you have not eaten for a while (fasting). You may have this done every 1-3 years.  Abdominal aortic aneurysm (AAA) screening. You may need this if you are a current or former smoker.  Sexually transmitted disease (STD) testing. Follow  these instructions at home: Eating and drinking  Eat a diet that includes fresh fruits and vegetables, whole grains, lean protein, and low-fat dairy products. Limit your intake of foods with high amounts of sugar, saturated fats, and salt.  Take vitamin and mineral supplements as recommended by your health care provider.  Do not drink alcohol if your health care provider tells you not to drink.  If you drink alcohol: ? Limit how much you have to 0-2 drinks a day. ? Be aware of how much alcohol is in your drink. In the [place] one drink equals one 12 oz bottle of beer (355 mL), one 5 oz glass of wine (148 mL), or one 1 oz glass of hard liquor (44 mL). Lifestyle  Take daily care of your teeth and gums.  Stay active. Exercise for at least 30 minutes on 5 or more days each week.  Do not use any products that contain nicotine or tobacco, such as cigarettes, e-cigarettes, and chewing tobacco. If you need help quitting, ask your health care provider.  If you are sexually active, practice safe sex. Use a condom or other form of protection to prevent STIs (sexually transmitted infections).  Talk with your health care provider about taking a low-dose aspirin or statin. What's next?  Visit your health care provider once a year for a well check visit.  Ask your health care provider how often you should have your eyes and teeth checked.  Stay up to date on all vaccines. This information is not intended to replace advice given to you by your health care provider. Make sure you discuss any questions you have with your health care provider. Document Released: 03/20/2015 Document Revised: 02/15/2018 Document Reviewed: 02/15/2018 Elsevier Patient Education  2020 [place], MD [facility]

## 2018-11-22 NOTE — Patient Instructions (Signed)
-Nice seeing you today!!  -Lab work today; will notify you once results are available.  -Flu vaccine today.  -Schedule follow up in 4 months.   Preventive Care 58 Years and Older, Male Preventive care refers to lifestyle choices and visits with your health care provider that can promote health and wellness. This includes:  A yearly physical exam. This is also called an annual well check.  Regular dental and eye exams.  Immunizations.  Screening for certain conditions.  Healthy lifestyle choices, such as diet and exercise. What can I expect for my preventive care visit? Physical exam Your health care provider will check:  Height and weight. These may be used to calculate body mass index (BMI), which is a measurement that tells if you are at a healthy weight.  Heart rate and blood pressure.  Your skin for abnormal spots. Counseling Your health care provider may ask you questions about:  Alcohol, tobacco, and drug use.  Emotional well-being.  Home and relationship well-being.  Sexual activity.  Eating habits.  History of falls.  Memory and ability to understand (cognition).  Work and work Statistician. What immunizations do I need?  Influenza (flu) vaccine  This is recommended every year. Tetanus, diphtheria, and pertussis (Tdap) vaccine  You may need a Td booster every 10 years. Varicella (chickenpox) vaccine  You may need this vaccine if you have not already been vaccinated. Zoster (shingles) vaccine  You may need this after age 72. Pneumococcal conjugate (PCV13) vaccine  One dose is recommended after age 72. Pneumococcal polysaccharide (PPSV23) vaccine  One dose is recommended after age 72. Measles, mumps, and rubella (MMR) vaccine  You may need at least one dose of MMR if you were born in 1957 or later. You may also need a second dose. Meningococcal conjugate (MenACWY) vaccine  You may need this if you have certain conditions. Hepatitis A  vaccine  You may need this if you have certain conditions or if you travel or work in places where you may be exposed to hepatitis A. Hepatitis B vaccine  You may need this if you have certain conditions or if you travel or work in places where you may be exposed to hepatitis B. Haemophilus influenzae type b (Hib) vaccine  You may need this if you have certain conditions. You may receive vaccines as individual doses or as more than one vaccine together in one shot (combination vaccines). Talk with your health care provider about the risks and benefits of combination vaccines. What tests do I need? Blood tests  Lipid and cholesterol levels. These may be checked every 5 years, or more frequently depending on your overall health.  Hepatitis C test.  Hepatitis B test. Screening  Lung cancer screening. You may have this screening every year starting at age 72 if you have a 30-pack-year history of smoking and currently smoke or have quit within the past 15 years.  Colorectal cancer screening. All adults should have this screening starting at age 72 and continuing until age 39. Your health care provider may recommend screening at age 72 if you are at increased risk. You will have tests every 1-10 years, depending on your results and the type of screening test.  Prostate cancer screening. Recommendations will vary depending on your family history and other risks.  Diabetes screening. This is done by checking your blood sugar (glucose) after you have not eaten for a while (fasting). You may have this done every 1-3 years.  Abdominal aortic aneurysm (AAA) screening.  You may need this if you are a current or former smoker.  Sexually transmitted disease (STD) testing. Follow these instructions at home: Eating and drinking  Eat a diet that includes fresh fruits and vegetables, whole grains, lean protein, and low-fat dairy products. Limit your intake of foods with high amounts of sugar, saturated  fats, and salt.  Take vitamin and mineral supplements as recommended by your health care provider.  Do not drink alcohol if your health care provider tells you not to drink.  If you drink alcohol: ? Limit how much you have to 0-2 drinks a day. ? Be aware of how much alcohol is in your drink. In the [place] one drink equals one 12 oz bottle of beer (355 mL), one 5 oz glass of wine (148 mL), or one 1 oz glass of hard liquor (44 mL). Lifestyle  Take daily care of your teeth and gums.  Stay active. Exercise for at least 30 minutes on 5 or more days each week.  Do not use any products that contain nicotine or tobacco, such as cigarettes, e-cigarettes, and chewing tobacco. If you need help quitting, ask your health care provider.  If you are sexually active, practice safe sex. Use a condom or other form of protection to prevent STIs (sexually transmitted infections).  Talk with your health care provider about taking a low-dose aspirin or statin. What's next?  Visit your health care provider once a year for a well check visit.  Ask your health care provider how often you should have your eyes and teeth checked.  Stay up to date on all vaccines. This information is not intended to replace advice given to you by your health care provider. Make sure you discuss any questions you have with your health care provider. Document Released: 03/20/2015 Document Revised: 02/15/2018 Document Reviewed: 02/15/2018 Elsevier Patient Education  2020 [employer].

## 2018-12-10 DIAGNOSIS — H43393 Other vitreous opacities, bilateral: Secondary | ICD-10-CM | POA: Diagnosis not present

## 2019-01-02 ENCOUNTER — Other Ambulatory Visit: Payer: Self-pay | Admitting: Family Medicine

## 2019-01-03 NOTE — Telephone Encounter (Signed)
Message routed to PCP CMA  

## 2019-01-04 NOTE — Telephone Encounter (Signed)
Refill sent.

## 2019-01-10 ENCOUNTER — Encounter: Payer: Self-pay | Admitting: Internal Medicine

## 2019-01-14 ENCOUNTER — Encounter: Payer: Self-pay | Admitting: Internal Medicine

## 2019-02-08 ENCOUNTER — Ambulatory Visit (AMBULATORY_SURGERY_CENTER): Payer: Medicare Other | Admitting: *Deleted

## 2019-02-08 ENCOUNTER — Encounter: Payer: Self-pay | Admitting: Internal Medicine

## 2019-02-08 ENCOUNTER — Other Ambulatory Visit: Payer: Self-pay

## 2019-02-08 VITALS — Temp 98.2°F | Ht 71.0 in | Wt 181.0 lb

## 2019-02-08 DIAGNOSIS — Z86010 Personal history of colonic polyps: Secondary | ICD-10-CM

## 2019-02-08 DIAGNOSIS — Z1159 Encounter for screening for other viral diseases: Secondary | ICD-10-CM

## 2019-02-08 MED ORDER — NA SULFATE-K SULFATE-MG SULF 17.5-3.13-1.6 GM/177ML PO SOLN: ORAL | 0 refills | Status: DC

## 2019-02-08 NOTE — Progress Notes (Signed)
Patient is here in-person for PV. Patient denies any allergies to eggs or soy. Patient denies any problems with anesthesia/sedation. Patient denies any oxygen use at home. Patient denies taking any diet/weight loss medications or blood thinners. Patient is not being treated for MRSA or C-diff.    COVID-19 screening test is on 12/15, the pt is aware. Pt is aware that care partner will wait in the car during procedure; if they feel like they will be too hot or cold to wait in the car; they may wait in the 4 th floor lobby. Patient is aware to bring only one care partner. We want them to wear a mask (we do not have any that we can provide them), practice social distancing, and we will check their temperatures when they get here.  I did remind the patient that their care partner needs to stay in the parking lot the entire time and have a cell phone available, we will call them when the pt is ready for discharge. Patient will wear mask into building.

## 2019-02-19 ENCOUNTER — Ambulatory Visit (INDEPENDENT_AMBULATORY_CARE_PROVIDER_SITE_OTHER): Payer: Medicare Other

## 2019-02-19 ENCOUNTER — Other Ambulatory Visit: Payer: Self-pay | Admitting: Internal Medicine

## 2019-02-19 DIAGNOSIS — Z1159 Encounter for screening for other viral diseases: Secondary | ICD-10-CM

## 2019-02-19 LAB — SARS CORONAVIRUS 2 (TAT 6-24 HRS): SARS Coronavirus 2: NEGATIVE

## 2019-02-22 ENCOUNTER — Other Ambulatory Visit: Payer: Self-pay | Admitting: Internal Medicine

## 2019-02-22 ENCOUNTER — Encounter: Payer: Self-pay | Admitting: Internal Medicine

## 2019-02-22 ENCOUNTER — Ambulatory Visit (AMBULATORY_SURGERY_CENTER): Payer: Medicare Other | Admitting: Internal Medicine

## 2019-02-22 ENCOUNTER — Other Ambulatory Visit: Payer: Self-pay

## 2019-02-22 VITALS — BP 120/68 | HR 69 | Temp 97.5°F | Resp 13 | Ht 71.0 in | Wt 181.0 lb

## 2019-02-22 DIAGNOSIS — Z1211 Encounter for screening for malignant neoplasm of colon: Secondary | ICD-10-CM | POA: Diagnosis not present

## 2019-02-22 DIAGNOSIS — K635 Polyp of colon: Secondary | ICD-10-CM

## 2019-02-22 DIAGNOSIS — Z86010 Personal history of colonic polyps: Secondary | ICD-10-CM | POA: Diagnosis not present

## 2019-02-22 DIAGNOSIS — D123 Benign neoplasm of transverse colon: Secondary | ICD-10-CM

## 2019-02-22 MED ORDER — SODIUM CHLORIDE 0.9 % IV SOLN: 500.0000 mL | Freq: Once | INTRAVENOUS | Status: DC

## 2019-02-22 MED ORDER — TRIAMTERENE-HCTZ 37.5-25 MG PO TABS: 1.0000 | ORAL_TABLET | Freq: Every morning | ORAL | 1 refills | Status: DC

## 2019-02-22 NOTE — Patient Instructions (Signed)
Information on polyps and hemorrhoids given to you today.  Await pathology results.  YOU HAD AN ENDOSCOPIC PROCEDURE TODAY AT THE [facility]:   Refer to the procedure report that was given to you for any specific questions about what was found during the examination.  If the procedure report does not answer your questions, please call your gastroenterologist to clarify.  If you requested that your care partner not be given the details of your procedure findings, then the procedure report has been included in a sealed envelope for you to review at your convenience later.  YOU SHOULD EXPECT: Some feelings of bloating in the abdomen. Passage of more gas than usual.  Walking can help get rid of the air that was put into your GI tract during the procedure and reduce the bloating. If you had a lower endoscopy (such as a colonoscopy or flexible sigmoidoscopy) you may notice spotting of blood in your stool or on the toilet paper. If you underwent a bowel prep for your procedure, you may not have a normal bowel movement for a few days.  Please Note:  You might notice some irritation and congestion in your nose or some drainage.  This is from the oxygen used during your procedure.  There is no need for concern and it should clear up in a day or so.  SYMPTOMS TO REPORT IMMEDIATELY:   Following lower endoscopy (colonoscopy or flexible sigmoidoscopy):  Excessive amounts of blood in the stool  Significant tenderness or worsening of abdominal pains  Swelling of the abdomen that is new, acute  Fever of 100F or higher   For urgent or emergent issues, a gastroenterologist can be reached at any hour by calling [phone].   DIET:  We do recommend a small meal at first, but then you may proceed to your regular diet.  Drink plenty of fluids but you should avoid alcoholic beverages for 24 hours.  ACTIVITY:  You should plan to take it easy for the rest of today and you should NOT DRIVE or use  heavy machinery until tomorrow (because of the sedation medicines used during the test).    FOLLOW UP: Our staff will call the number listed on your records 48-72 hours following your procedure to check on you and address any questions or concerns that you may have regarding the information given to you following your procedure. If we do not reach you, we will leave a message.  We will attempt to reach you two times.  During this call, we will ask if you have developed any symptoms of COVID 19. If you develop any symptoms (ie: fever, flu-like symptoms, shortness of breath, cough etc.) before then, please call [phone].  If you test positive for Covid 19 in the 2 weeks post procedure, please call and report this information to us.    If any biopsies were taken you will be contacted by phone or by letter within the next 1-3 weeks.  Please call us at [phone] if you have not heard about the biopsies in 3 weeks.    SIGNATURES/CONFIDENTIALITY: You and/or your care partner have signed paperwork which will be entered into your electronic medical record.  These signatures attest to the fact that that the information above on your After Visit Summary has been reviewed and is understood.  Full responsibility of the confidentiality of this discharge information lies with you and/or your care-partner. 

## 2019-02-22 NOTE — Progress Notes (Signed)
Pt's states no medical or surgical changes since previsit or office visit.  Temp JB VS CW 

## 2019-02-22 NOTE — Progress Notes (Signed)
A and O x3. Report to RN. Tolerated MAC anesthesia well.

## 2019-02-22 NOTE — Progress Notes (Signed)
Called to room to assist during endoscopic procedure.  Patient ID and intended procedure confirmed with present staff. Received instructions for my participation in the procedure from the performing physician.  

## 2019-02-22 NOTE — Op Note (Signed)
[facility] Patient Name: Richard Sparks Procedure Date: 02/22/2019 1:27 PM MRN: [id number] Endoscopist: Docia Chuck. Henrene Pastor , MD Age: 73 Referring MD:  Date of Birth: 10/11/1945 Gender: Male Account #: [id number] Procedure:                Colonoscopy with cold snare polypectomy x 1 Indications:              High risk colon cancer surveillance: Personal                            history of non-advanced adenomas.2005 (DRP                            negative); 2015 (2 TAs) Medicines:                Monitored Anesthesia Care Procedure:                Pre-Anesthesia Assessment:                           - Prior to the procedure, a History and Physical                            was performed, and patient medications and                            allergies were reviewed. The patient's tolerance of                            previous anesthesia was also reviewed. The risks                            and benefits of the procedure and the sedation                            options and risks were discussed with the patient.                            All questions were answered, and informed consent                            was obtained. Prior Anticoagulants: The patient has                            taken no previous anticoagulant or antiplatelet                            agents. ASA Grade Assessment: II - A patient with                            mild systemic disease. After reviewing the risks                            and benefits, the patient was deemed in  satisfactory condition to undergo the procedure.                           After obtaining informed consent, the colonoscope                            was passed under direct vision. Throughout the                            procedure, the patient's blood pressure, pulse, and                            oxygen saturations were monitored continuously. The                            Colonoscope was  introduced through the anus and                            advanced to the the cecum, identified by                            appendiceal orifice and ileocecal valve. The                            ileocecal valve, appendiceal orifice, and rectum                            were photographed. The quality of the bowel                            preparation was excellent. The colonoscopy was                            performed without difficulty. The patient tolerated                            the procedure well. The bowel preparation used was                            SUPREP via split dose instruction. Scope In: 1:33:03 PM Scope Out: 1:47:45 PM Scope Withdrawal Time: 0 hours 6 minutes 20 seconds  Total Procedure Duration: 0 hours 14 minutes 42 seconds  Findings:                 A 5 mm polyp was found in the transverse colon. The                            polyp was removed with a cold snare. Resection and                            retrieval were complete.                           Internal hemorrhoids were found during retroflexion.  The exam was otherwise without abnormality on                            direct and retroflexion views. Complications:            No immediate complications. Estimated blood loss:                            None. Estimated Blood Loss:     Estimated blood loss: none. Impression:               - One 5 mm polyp in the transverse colon, removed                            with a cold snare. Resected and retrieved.                           - Internal hemorrhoids.                           - The examination was otherwise normal on direct                            and retroflexion views. Recommendation:           - Repeat colonoscopy in 5 years for surveillance if                            polyp adenomatous; otherwise prn.                           - Patient has a contact number available for                            emergencies.  The signs and symptoms of potential                            delayed complications were discussed with the                            patient. Return to normal activities tomorrow.                            Written discharge instructions were provided to the                            patient.                           - Resume previous diet.                           - Continue present medications.                           - Await pathology results. Docia Chuck. Henrene Pastor, MD 02/22/2019 1:55:38 PM This report has been signed electronically.

## 2019-02-22 NOTE — Telephone Encounter (Signed)
Medication Refill - Medication: triamterene-hydrochlorothiazide (MAXZIDE-25) 37.5-25 MG tablet  Pt will be out of medication within 10 days and gets this medication from a mail order pharmacy, so pt will need refill processed asap.  Preferred Pharmacy:  [facility], [address] [place] Phone:  [phone]  Fax:  [phone]       Pt was advised that RX refills may take up to 3 business days. We ask that you follow-up with your pharmacy.

## 2019-02-26 ENCOUNTER — Telehealth: Payer: Self-pay

## 2019-02-26 NOTE — Telephone Encounter (Signed)
  Follow up Call-  Call back number 02/22/2019  Post procedure Call Back phone  # [phone]  Permission to leave phone message Yes  Some recent data might be hidden     Patient questions:  Do you have a fever, pain , or abdominal swelling? No. Pain Score  0 *  Have you tolerated food without any problems? Yes.    Have you been able to return to your normal activities? Yes.    Do you have any questions about your discharge instructions: Diet   No. Medications  No. Follow up visit  No.  Do you have questions or concerns about your Care? No.  Actions: * If pain score is 4 or above: No action needed, pain <4.  1. Have you developed a fever since your procedure? no  2.   Have you had an respiratory symptoms (SOB or cough) since your procedure? no  3.   Have you tested positive for COVID 19 since your procedure no  4.   Have you had any family members/close contacts diagnosed with the COVID 19 since your procedure?  no   If yes to any of these questions please route to Joylene John, RN and Alphonsa Gin, Therapist, sports.

## 2019-02-28 ENCOUNTER — Encounter: Payer: Self-pay | Admitting: Internal Medicine

## 2019-03-26 ENCOUNTER — Telehealth (INDEPENDENT_AMBULATORY_CARE_PROVIDER_SITE_OTHER): Payer: Medicare Other | Admitting: Internal Medicine

## 2019-03-26 ENCOUNTER — Encounter: Payer: Self-pay | Admitting: *Deleted

## 2019-03-26 ENCOUNTER — Other Ambulatory Visit: Payer: Self-pay

## 2019-03-26 DIAGNOSIS — F339 Major depressive disorder, recurrent, unspecified: Secondary | ICD-10-CM

## 2019-03-26 DIAGNOSIS — E785 Hyperlipidemia, unspecified: Secondary | ICD-10-CM | POA: Diagnosis not present

## 2019-03-26 DIAGNOSIS — I10 Essential (primary) hypertension: Secondary | ICD-10-CM | POA: Diagnosis not present

## 2019-03-26 NOTE — Progress Notes (Signed)
Virtual Visit via Video Note  I connected with Richard Sparks on 03/26/19 at  1:00 PM EST by a video enabled telemedicine application and verified that I am speaking with the correct person using two identifiers.  Location patient: home Location provider: work office Persons participating in the virtual visit: patient, provider  I discussed the limitations of evaluation and management by telemedicine and the availability of in person appointments. The patient expressed understanding and agreed to proceed.   HPI: This visit has been scheduled as a chronic condition follow-up.  He has no acute complaints today.  He does have some questions about Covid vaccination which I have answered to the best of my ability.  His past medical history significant for hypertension that has been well controlled.  He recently purchased a blood pressure cuff and is getting measurements that are much higher than his usual measurements in office.  He has a history of hyperlipidemia on statin therapy, history of depression on Wellbutrin and history of insomnia on temazepam.  He states he gets around 5-1/2 to 6 hours of sleep on average per night.     ROS: Constitutional: Denies fever, chills, diaphoresis, appetite change and fatigue.  HEENT: Denies photophobia, eye pain, redness, hearing loss, ear pain, congestion, sore throat, rhinorrhea, sneezing, mouth sores, trouble swallowing, neck pain, neck stiffness and tinnitus.   Respiratory: Denies SOB, DOE, cough, chest tightness,  and wheezing.   Cardiovascular: Denies chest pain, palpitations and leg swelling.  Gastrointestinal: Denies nausea, vomiting, abdominal pain, diarrhea, constipation, blood in stool and abdominal distention.  Genitourinary: Denies dysuria, urgency, frequency, hematuria, flank pain and difficulty urinating.  Endocrine: Denies: hot or cold intolerance, sweats, changes in hair or nails, polyuria, polydipsia. Musculoskeletal: Denies myalgias,  back pain, joint swelling, arthralgias and gait problem.  Skin: Denies pallor, rash and wound.  Neurological: Denies dizziness, seizures, syncope, weakness, light-headedness, numbness and headaches.  Hematological: Denies adenopathy. Easy bruising, personal or family bleeding history  Psychiatric/Behavioral: Denies suicidal ideation, mood changes, confusion, nervousness, sleep disturbance and agitation   Past Medical History:  Diagnosis Date  . Allergy   . BENIGN PROSTATIC HYPERTROPHY 08/01/2006  . DEPRESSION 08/01/2006  . HYPERLIPIDEMIA 08/01/2006  . HYPERTENSION 08/01/2006    Past Surgical History:  Procedure Laterality Date  . COLONOSCOPY  last 12/31/2013  . NO PAST SURGERIES      Family History  Problem Relation Age of Onset  . Colon cancer Neg Hx   . Esophageal cancer Neg Hx   . Rectal cancer Neg Hx   . Stomach cancer Neg Hx   . Colon polyps Neg Hx     SOCIAL HX:   reports that he has never smoked. He has never used smokeless tobacco. He reports that he does not drink alcohol or use drugs.   Current Outpatient Medications:  .  b complex vitamins tablet, Take 1 tablet by mouth daily., Disp: , Rfl:  .  buPROPion (WELLBUTRIN XL) 150 MG 24 hr tablet, TAKE 1 TABLET BY MOUTH  DAILY, Disp: 90 tablet, Rfl: 1 .  Cholecalciferol (VITAMIN D) 1000 UNITS capsule, Take 1,000 Units by mouth daily.  , Disp: , Rfl:  .  fluticasone (FLONASE) 50 MCG/ACT nasal spray, Place 1 spray into both nostrils daily., Disp: 16 g, Rfl: 5 .  loratadine (CLARITIN) 10 MG tablet, Take 10 mg by mouth daily., Disp: , Rfl:  .  Omega-3 Fatty Acids (FISH OIL) 1000 MG CAPS, Take 3 capsules by mouth daily.  , Disp: ,  Rfl:  .  oxymetazoline (AFRIN) 0.05 % nasal spray, Place 1 spray into both nostrils 2 (two) times daily as needed for congestion., Disp: , Rfl:  .  pravastatin (PRAVACHOL) 40 MG tablet, TAKE 1 TABLET BY MOUTH  DAILY, Disp: 90 tablet, Rfl: 1 .  temazepam (RESTORIL) 15 MG capsule, Take 1 capsule (15 mg  total) by mouth at bedtime as needed. for sleep, Disp: 90 capsule, Rfl: 1 .  triamterene-hydrochlorothiazide (MAXZIDE-25) 37.5-25 MG tablet, Take 1 tablet by mouth every morning. Must keep appointment with new provider for more refills, Disp: 90 tablet, Rfl: 1  EXAM:   VITALS per patient if applicable: None reported  GENERAL: alert, oriented, appears well and in no acute distress  HEENT: atraumatic, conjunttiva clear, no obvious abnormalities on inspection of external nose and ears  NECK: normal movements of the head and neck  LUNGS: on inspection no signs of respiratory distress, breathing rate appears normal, no obvious gross increased work of breathing, gasping or wheezing  CV: no obvious cyanosis  MS: moves all visible extremities without noticeable abnormality  PSYCH/NEURO: pleasant and cooperative, no obvious depression or anxiety, speech and thought processing grossly intact  ASSESSMENT AND PLAN:   Essential hypertension -Has been well controlled in office past few visits. -Have asked that he bring in his ambulatory blood pressure cuff to his next visit so we can assess.  Dyslipidemia -Last LDL was 139 in September 2020.  He is on pravastatin.  Depression, recurrent ([place]) -Mood is stable on Wellbutrin.     I discussed the assessment and treatment plan with the patient. The patient was provided an opportunity to ask questions and all were answered. The patient agreed with the plan and demonstrated an understanding of the instructions.   The patient was advised to call back or seek an in-person evaluation if the symptoms worsen or if the condition fails to improve as anticipated.    [name], MD  [facility]

## 2019-07-04 ENCOUNTER — Other Ambulatory Visit: Payer: Self-pay

## 2019-07-05 ENCOUNTER — Ambulatory Visit (INDEPENDENT_AMBULATORY_CARE_PROVIDER_SITE_OTHER): Payer: Medicare Other | Admitting: Internal Medicine

## 2019-07-05 ENCOUNTER — Encounter: Payer: Self-pay | Admitting: Internal Medicine

## 2019-07-05 VITALS — BP 165/92 | HR 66 | Temp 97.5°F | Wt 184.7 lb

## 2019-07-05 DIAGNOSIS — I10 Essential (primary) hypertension: Secondary | ICD-10-CM | POA: Diagnosis not present

## 2019-07-05 MED ORDER — AMLODIPINE BESYLATE 5 MG PO TABS: 5.0000 mg | ORAL_TABLET | Freq: Every day | ORAL | 1 refills | Status: DC

## 2019-07-05 NOTE — Patient Instructions (Signed)
-  Nice seeing you today!!  -Start norvasc (amlodipine) 5 mg daily.  -Schedule a blood pressure check in 6 weeks.

## 2019-07-05 NOTE — Progress Notes (Signed)
Established Patient Office Visit     This visit occurred during the SARS-CoV-2 public health emergency.  Safety protocols were in place, including screening questions prior to the visit, additional usage of staff PPE, and extensive cleaning of exam room while observing appropriate contact time as indicated for disinfecting solutions.    CC/Reason for Visit: Blood pressure follow-up  HPI: Richard Sparks is a 74 y.o. male who is coming in today for the above mentioned reasons.  He was concerned that his home blood pressure monitor was giving him numbers that are higher than his usual.  Blood pressure in office today is 160/90, taken by machine is 165/92.  He is compliant with his blood pressure medication.   Past Medical/Surgical History: Past Medical History:  Diagnosis Date  . Allergy   . BENIGN PROSTATIC HYPERTROPHY 08/01/2006  . DEPRESSION 08/01/2006  . HYPERLIPIDEMIA 08/01/2006  . HYPERTENSION 08/01/2006    Past Surgical History:  Procedure Laterality Date  . COLONOSCOPY  last 12/31/2013  . NO PAST SURGERIES      Social History:  reports that he has never smoked. He has never used smokeless tobacco. He reports that he does not drink alcohol or use drugs.  Allergies: Allergies  Allergen Reactions  . Amoxicillin Rash    Family History:  Family History  Problem Relation Age of Onset  . Colon cancer Neg Hx   . Esophageal cancer Neg Hx   . Rectal cancer Neg Hx   . Stomach cancer Neg Hx   . Colon polyps Neg Hx      Current Outpatient Medications:  .  b complex vitamins tablet, Take 1 tablet by mouth daily., Disp: , Rfl:  .  buPROPion (WELLBUTRIN XL) 150 MG 24 hr tablet, TAKE 1 TABLET BY MOUTH  DAILY, Disp: 90 tablet, Rfl: 1 .  Cholecalciferol (VITAMIN D) 1000 UNITS capsule, Take 1,000 Units by mouth daily.  , Disp: , Rfl:  .  fluticasone (FLONASE) 50 MCG/ACT nasal spray, Place 1 spray into both nostrils daily., Disp: 16 g, Rfl: 5 .  loratadine (CLARITIN) 10 MG  tablet, Take 10 mg by mouth daily., Disp: , Rfl:  .  Omega-3 Fatty Acids (FISH OIL) 1000 MG CAPS, Take 3 capsules by mouth daily.  , Disp: , Rfl:  .  oxymetazoline (AFRIN) 0.05 % nasal spray, Place 1 spray into both nostrils 2 (two) times daily as needed for congestion., Disp: , Rfl:  .  pravastatin (PRAVACHOL) 40 MG tablet, TAKE 1 TABLET BY MOUTH  DAILY, Disp: 90 tablet, Rfl: 1 .  temazepam (RESTORIL) 15 MG capsule, Take 1 capsule (15 mg total) by mouth at bedtime as needed. for sleep, Disp: 90 capsule, Rfl: 1 .  triamterene-hydrochlorothiazide (MAXZIDE-25) 37.5-25 MG tablet, Take 1 tablet by mouth every morning. Must keep appointment with new provider for more refills, Disp: 90 tablet, Rfl: 1 .  amLODipine (NORVASC) 5 MG tablet, Take 1 tablet (5 mg total) by mouth daily., Disp: 90 tablet, Rfl: 1  Review of Systems:  Constitutional: Denies fever, chills, diaphoresis, appetite change and fatigue.  HEENT: Denies photophobia, eye pain, redness, hearing loss, ear pain, congestion, sore throat, rhinorrhea, sneezing, mouth sores, trouble swallowing, neck pain, neck stiffness and tinnitus.   Respiratory: Denies SOB, DOE, cough, chest tightness,  and wheezing.   Cardiovascular: Denies chest pain, palpitations and leg swelling.  Gastrointestinal: Denies nausea, vomiting, abdominal pain, diarrhea, constipation, blood in stool and abdominal distention.  Genitourinary: Denies dysuria, urgency, frequency, hematuria, flank  pain and difficulty urinating.  Endocrine: Denies: hot or cold intolerance, sweats, changes in hair or nails, polyuria, polydipsia. Musculoskeletal: Denies myalgias, back pain, joint swelling, arthralgias and gait problem.  Skin: Denies pallor, rash and wound.  Neurological: Denies dizziness, seizures, syncope, weakness, light-headedness, numbness and headaches.  Hematological: Denies adenopathy. Easy bruising, personal or family bleeding history  Psychiatric/Behavioral: Denies suicidal  ideation, mood changes, confusion, nervousness, sleep disturbance and agitation    Physical Exam: Vitals:   07/05/19 1026 07/05/19 1030  BP: (!) 160/90 (!) 165/92  Pulse: 66   Temp: (!) 97.5 F (36.4 C)   TempSrc: Temporal   SpO2: 97%   Weight: 184 lb 11.2 oz (83.8 kg)     Body mass index is 25.76 kg/m.   Constitutional: NAD, calm, comfortable Eyes: PERRL, lids and conjunctivae normal ENMT: Mucous membranes are moist.  Respiratory: clear to auscultation bilaterally, no wheezing, no crackles. Normal respiratory effort. No accessory muscle use.  Cardiovascular: Regular rate and rhythm, no murmurs / rubs / gallops. No extremity edema. Neurologic: Grossly intact and nonfocal Psychiatric: Normal judgment and insight. Alert and oriented x 3. Normal mood.    Impression and Plan:  Essential hypertension -Blood pressure is elevated.  Measurements in office coincide with measurements from home monitor. -He is on Maxide 37.5/25. -Add amlodipine 5 mg and return in 6 weeks for follow-up.    Patient Instructions  -Nice seeing you today!!  -Start norvasc (amlodipine) 5 mg daily.  -Schedule a blood pressure check in 6 weeks.     [name], MD [facility] at [facility]

## 2019-07-15 ENCOUNTER — Other Ambulatory Visit: Payer: Self-pay | Admitting: Internal Medicine

## 2019-08-20 ENCOUNTER — Encounter: Payer: Self-pay | Admitting: Internal Medicine

## 2019-08-20 ENCOUNTER — Other Ambulatory Visit: Payer: Self-pay

## 2019-08-20 ENCOUNTER — Ambulatory Visit (INDEPENDENT_AMBULATORY_CARE_PROVIDER_SITE_OTHER): Payer: Medicare Other | Admitting: Internal Medicine

## 2019-08-20 VITALS — BP 140/90 | HR 68 | Temp 98.0°F | Wt 188.0 lb

## 2019-08-20 DIAGNOSIS — I10 Essential (primary) hypertension: Secondary | ICD-10-CM | POA: Diagnosis not present

## 2019-08-20 MED ORDER — AMLODIPINE BESYLATE 10 MG PO TABS: 10.0000 mg | ORAL_TABLET | Freq: Every day | ORAL | 1 refills | Status: DC

## 2019-08-20 NOTE — Progress Notes (Signed)
Established Patient Office Visit     This visit occurred during the SARS-CoV-2 public health emergency.  Safety protocols were in place, including screening questions prior to the visit, additional usage of staff PPE, and extensive cleaning of exam room while observing appropriate contact time as indicated for disinfecting solutions.    CC/Reason for Visit: Blood pressure follow-up  HPI: Richard Sparks is a 74 y.o. male who is coming in today for the above mentioned reasons.  He was seen 6 weeks ago for hypertension.  At that time amlodipine 5 mg was added to Maxide 37.5/25 mg due to elevated pressures.  He is here today for follow-up.  We have taken his blood pressure twice in office today, 140/90, 150/80 and with his machine is 157/84.  He denies chest pain, shortness of breath, lower extremity edema.  He is trying to watch the salt in his diet.   Past Medical/Surgical History: Past Medical History:  Diagnosis Date  . Allergy   . BENIGN PROSTATIC HYPERTROPHY 08/01/2006  . DEPRESSION 08/01/2006  . HYPERLIPIDEMIA 08/01/2006  . HYPERTENSION 08/01/2006    Past Surgical History:  Procedure Laterality Date  . COLONOSCOPY  last 12/31/2013  . NO PAST SURGERIES      Social History:  reports that he has never smoked. He has never used smokeless tobacco. He reports that he does not drink alcohol and does not use drugs.  Allergies: Allergies  Allergen Reactions  . Amoxicillin Rash    Family History:  Family History  Problem Relation Age of Onset  . Colon cancer Neg Hx   . Esophageal cancer Neg Hx   . Rectal cancer Neg Hx   . Stomach cancer Neg Hx   . Colon polyps Neg Hx      Current Outpatient Medications:  .  amLODipine (NORVASC) 10 MG tablet, Take 1 tablet (10 mg total) by mouth daily., Disp: 90 tablet, Rfl: 1 .  b complex vitamins tablet, Take 1 tablet by mouth daily., Disp: , Rfl:  .  buPROPion (WELLBUTRIN XL) 150 MG 24 hr tablet, TAKE 1 TABLET BY MOUTH  DAILY, Disp:  90 tablet, Rfl: 1 .  Cholecalciferol (VITAMIN D) 1000 UNITS capsule, Take 1,000 Units by mouth daily.  , Disp: , Rfl:  .  fluticasone (FLONASE) 50 MCG/ACT nasal spray, Place 1 spray into both nostrils daily., Disp: 16 g, Rfl: 5 .  loratadine (CLARITIN) 10 MG tablet, Take 10 mg by mouth daily., Disp: , Rfl:  .  Omega-3 Fatty Acids (FISH OIL) 1000 MG CAPS, Take 3 capsules by mouth daily.  , Disp: , Rfl:  .  oxymetazoline (AFRIN) 0.05 % nasal spray, Place 1 spray into both nostrils 2 (two) times daily as needed for congestion., Disp: , Rfl:  .  pravastatin (PRAVACHOL) 40 MG tablet, TAKE 1 TABLET BY MOUTH  DAILY, Disp: 90 tablet, Rfl: 1 .  temazepam (RESTORIL) 15 MG capsule, Take 1 capsule (15 mg total) by mouth at bedtime as needed. for sleep, Disp: 90 capsule, Rfl: 1 .  triamterene-hydrochlorothiazide (MAXZIDE-25) 37.5-25 MG tablet, TAKE 1 TABLET BY MOUTH IN  THE MORNING, Disp: 90 tablet, Rfl: 1  Review of Systems:  Constitutional: Denies fever, chills, diaphoresis, appetite change and fatigue.  HEENT: Denies photophobia, eye pain, redness, hearing loss, ear pain, congestion, sore throat, rhinorrhea, sneezing, mouth sores, trouble swallowing, neck pain, neck stiffness and tinnitus.   Respiratory: Denies SOB, DOE, cough, chest tightness,  and wheezing.   Cardiovascular: Denies chest pain,  palpitations and leg swelling.  Gastrointestinal: Denies nausea, vomiting, abdominal pain, diarrhea, constipation, blood in stool and abdominal distention.  Genitourinary: Denies dysuria, urgency, frequency, hematuria, flank pain and difficulty urinating.  Endocrine: Denies: hot or cold intolerance, sweats, changes in hair or nails, polyuria, polydipsia. Musculoskeletal: Denies myalgias, back pain, joint swelling, arthralgias and gait problem.  Skin: Denies pallor, rash and wound.  Neurological: Denies dizziness, seizures, syncope, weakness, light-headedness, numbness and headaches.  Hematological: Denies  adenopathy. Easy bruising, personal or family bleeding history  Psychiatric/Behavioral: Denies suicidal ideation, mood changes, confusion, nervousness, sleep disturbance and agitation    Physical Exam: Vitals:   08/20/19 1034  BP: 140/90  Pulse: 68  Temp: 98 F (36.7 C)  TempSrc: Temporal  SpO2: 95%  Weight: 188 lb (85.3 kg)    Body mass index is 26.22 kg/m.   Constitutional: NAD, calm, comfortable Eyes: PERRL, lids and conjunctivae normal ENMT: Mucous membranes are moist.  Cardiovascular: . No extremity edema.   Neurologic: Grossly intact and nonfocal Psychiatric: Normal judgment and insight. Alert and oriented x 3. Normal mood.    Impression and Plan:  Essential hypertension  -Remains uncontrolled. -Increase amlodipine from 5 to 10 mg, continue Maxide. -Return in 6 to 8 weeks for follow-up.    [name], MD [facility] at [facility]

## 2019-09-28 ENCOUNTER — Other Ambulatory Visit: Payer: Self-pay | Admitting: Internal Medicine

## 2019-09-28 DIAGNOSIS — G4700 Insomnia, unspecified: Secondary | ICD-10-CM

## 2019-10-24 ENCOUNTER — Ambulatory Visit (INDEPENDENT_AMBULATORY_CARE_PROVIDER_SITE_OTHER): Payer: Medicare Other | Admitting: Internal Medicine

## 2019-10-24 ENCOUNTER — Other Ambulatory Visit: Payer: Self-pay

## 2019-10-24 ENCOUNTER — Encounter: Payer: Self-pay | Admitting: Internal Medicine

## 2019-10-24 VITALS — BP 118/70 | HR 72 | Temp 98.1°F | Ht 71.0 in | Wt 189.8 lb

## 2019-10-24 DIAGNOSIS — I10 Essential (primary) hypertension: Secondary | ICD-10-CM

## 2019-10-24 NOTE — Progress Notes (Signed)
Established Patient Office Visit     This visit occurred during the SARS-CoV-2 public health emergency.  Safety protocols were in place, including screening questions prior to the visit, additional usage of staff PPE, and extensive cleaning of exam room while observing appropriate contact time as indicated for disinfecting solutions.    CC/Reason for Visit: Blood pressure follow-up  HPI: Richard Sparks is a 74 y.o. male who is coming in today for the above mentioned reasons.  At last visit we had increased his amlodipine from 5 to 10 mg in addition to Maxide 37.5/25 mg due to uncontrolled blood pressure.  He has not been checking blood pressure routinely at home.  He has no complaints today.  He has been feeling well.   Past Medical/Surgical History: Past Medical History:  Diagnosis Date   Allergy    BENIGN PROSTATIC HYPERTROPHY 08/01/2006   DEPRESSION 08/01/2006   HYPERLIPIDEMIA 08/01/2006   HYPERTENSION 08/01/2006    Past Surgical History:  Procedure Laterality Date   COLONOSCOPY  last 12/31/2013   NO PAST SURGERIES      Social History:  reports that he has never smoked. He has never used smokeless tobacco. He reports that he does not drink alcohol and does not use drugs.  Allergies: Allergies  Allergen Reactions   Amoxicillin Rash    Family History:  Family History  Problem Relation Age of Onset   Colon cancer Neg Hx    Esophageal cancer Neg Hx    Rectal cancer Neg Hx    Stomach cancer Neg Hx    Colon polyps Neg Hx      Current Outpatient Medications:    amLODipine (NORVASC) 10 MG tablet, Take 1 tablet (10 mg total) by mouth daily., Disp: 90 tablet, Rfl: 1   b complex vitamins tablet, Take 1 tablet by mouth daily., Disp: , Rfl:    buPROPion (WELLBUTRIN XL) 150 MG 24 hr tablet, TAKE 1 TABLET BY MOUTH  DAILY, Disp: 90 tablet, Rfl: 1   Cholecalciferol (VITAMIN D) 1000 UNITS capsule, Take 1,000 Units by mouth daily.  , Disp: , Rfl:     fluticasone (FLONASE) 50 MCG/ACT nasal spray, Place 1 spray into both nostrils daily., Disp: 16 g, Rfl: 5   loratadine (CLARITIN) 10 MG tablet, Take 10 mg by mouth daily., Disp: , Rfl:    Omega-3 Fatty Acids (FISH OIL) 1000 MG CAPS, Take 3 capsules by mouth daily.  , Disp: , Rfl:    oxymetazoline (AFRIN) 0.05 % nasal spray, Place 1 spray into both nostrils 2 (two) times daily as needed for congestion., Disp: , Rfl:    pravastatin (PRAVACHOL) 40 MG tablet, TAKE 1 TABLET BY MOUTH  DAILY, Disp: 90 tablet, Rfl: 1   temazepam (RESTORIL) 15 MG capsule, TAKE 1 CAPSULE (15 MG TOTAL) BY MOUTH AT BEDTIME AS NEEDED. FOR SLEEP, Disp: 90 capsule, Rfl: 0   triamterene-hydrochlorothiazide (MAXZIDE-25) 37.5-25 MG tablet, TAKE 1 TABLET BY MOUTH IN  THE MORNING, Disp: 90 tablet, Rfl: 1  Review of Systems:  Constitutional: Denies fever, chills, diaphoresis, appetite change and fatigue.  HEENT: Denies photophobia, eye pain, redness, hearing loss, ear pain, congestion, sore throat, rhinorrhea, sneezing, mouth sores, trouble swallowing, neck pain, neck stiffness and tinnitus.   Respiratory: Denies SOB, DOE, cough, chest tightness,  and wheezing.   Cardiovascular: Denies chest pain, palpitations and leg swelling.  Gastrointestinal: Denies nausea, vomiting, abdominal pain, diarrhea, constipation, blood in stool and abdominal distention.  Genitourinary: Denies dysuria, urgency, frequency, hematuria,  flank pain and difficulty urinating.  Endocrine: Denies: hot or cold intolerance, sweats, changes in hair or nails, polyuria, polydipsia. Musculoskeletal: Denies myalgias, back pain, joint swelling, arthralgias and gait problem.  Skin: Denies pallor, rash and wound.  Neurological: Denies dizziness, seizures, syncope, weakness, light-headedness, numbness and headaches.  Hematological: Denies adenopathy. Easy bruising, personal or family bleeding history  Psychiatric/Behavioral: Denies suicidal ideation, mood changes,  confusion, nervousness, sleep disturbance and agitation    Physical Exam: Vitals:   10/24/19 0954  BP: 118/70  Pulse: 72  Temp: 98.1 F (36.7 C)  TempSrc: Oral  SpO2: 97%  Weight: 189 lb 12.8 oz (86.1 kg)  Height: 5\' 11"  (1.803 m)    Body mass index is 26.47 kg/m.   Constitutional: NAD, calm, comfortable Eyes: PERRL, lids and conjunctivae normal ENMT: Mucous membranes are moist.  Respiratory: clear to auscultation bilaterally, no wheezing, no crackles. Normal respiratory effort. No accessory muscle use.  Cardiovascular: Regular rate and rhythm, no murmurs / rubs / gallops. No extremity edema.  Neurologic: Grossly intact and nonfocal Psychiatric: Normal judgment and insight. Alert and oriented x 3. Normal mood.    Impression and Plan:  Essential hypertension -Well-controlled today, continue current regimen, return in 3 months for follow-up.     [name], MD [facility] at [facility]

## 2019-11-13 ENCOUNTER — Telehealth: Payer: Self-pay | Admitting: Internal Medicine

## 2019-11-13 NOTE — Progress Notes (Signed)
  Chronic Care Management   Note  11/13/2019 Name: Richard Sparks MRN: [id number] DOB: September 29, 1945  Richard Sparks is a 74 y.o. year old male who is a primary care patient of [name], [name], MD. I reached out to Gregor Hams by phone today in response to a referral sent by Richard Sparks's PCP, [name], [name], MD.   [name] was given information about Chronic Care Management services today including:  1. CCM service includes personalized support from designated clinical staff supervised by his physician, including individualized plan of care and coordination with other care providers 2. 24/7 contact phone numbers for assistance for urgent and routine care needs. 3. Service will only be billed when office clinical staff spend 20 minutes or more in a month to coordinate care. 4. Only one practitioner may furnish and bill the service in a calendar month. 5. The patient may stop CCM services at any time (effective at the end of the month) by phone call to the office staff.   Patient agreed to services and verbal consent obtained.   Follow up plan:   Carley Perdue [employer] Scheduler

## 2019-12-11 DIAGNOSIS — H43393 Other vitreous opacities, bilateral: Secondary | ICD-10-CM | POA: Diagnosis not present

## 2020-01-02 ENCOUNTER — Telehealth: Payer: Self-pay | Admitting: Pharmacist

## 2020-01-02 ENCOUNTER — Ambulatory Visit: Payer: Medicare Other | Admitting: Pharmacist

## 2020-01-02 DIAGNOSIS — E785 Hyperlipidemia, unspecified: Secondary | ICD-10-CM

## 2020-01-02 DIAGNOSIS — I10 Essential (primary) hypertension: Secondary | ICD-10-CM

## 2020-01-02 NOTE — Chronic Care Management (AMB) (Signed)
    Chronic Care Management Pharmacy Assistant   Name: MAKSYMILIAN MABEY  MRN: [id number] DOB: 1945/06/30  Reason for Encounter:  Medication Review/Initial Questions for Pharmacist visit on 01-02-2020   Patient Questions: 1. Have you seen any other providers since your last Yes, he just had his annual eye exam. 2. Any changes in your medications or health? No 3. Any side effects from any medications? No 4. Do you have any symptoms or problems not managed by your medications? No 5. Any concerns about your health right now? Yes, he states he has had some issues with brain fog. 6. Has your provider asked that you check blood pressure, blood sugar, or follow a special diet at home? No 7. Do you get any type of exercise regularly? Yes, he walks his dogs daily. 8. Can you think of a goal you would like to reach for your health? No 9. Do you have any problems getting your medications? No 10.  Is there anything that you would like to discuss during the appointment? No   He was informed to have his medications and supplements near during his telephone appointments and have blood pressure and blood sugar logs available.  PCP : [name], [name], MD  Allergies:   Allergies  Allergen Reactions  . Amoxicillin Rash    Medications: Outpatient Encounter Medications as of 01/02/2020  Medication Sig  . amLODipine (NORVASC) 10 MG tablet Take 1 tablet (10 mg total) by mouth daily.  Marland Kitchen b complex vitamins tablet Take 1 tablet by mouth daily.  Marland Kitchen buPROPion (WELLBUTRIN XL) 150 MG 24 hr tablet TAKE 1 TABLET BY MOUTH  DAILY  . Cholecalciferol (VITAMIN D) 1000 UNITS capsule Take 1,000 Units by mouth daily.    . fluticasone (FLONASE) 50 MCG/ACT nasal spray Place 1 spray into both nostrils daily.  Marland Kitchen loratadine (CLARITIN) 10 MG tablet Take 10 mg by mouth daily.  . Omega-3 Fatty Acids (FISH OIL) 1000 MG CAPS Take 3 capsules by mouth daily.    Marland Kitchen oxymetazoline (AFRIN) 0.05 % nasal spray Place 1 spray into  both nostrils 2 (two) times daily as needed for congestion.  . pravastatin (PRAVACHOL) 40 MG tablet TAKE 1 TABLET BY MOUTH  DAILY  . temazepam (RESTORIL) 15 MG capsule TAKE 1 CAPSULE (15 MG TOTAL) BY MOUTH AT BEDTIME AS NEEDED. FOR SLEEP  . triamterene-hydrochlorothiazide (MAXZIDE-25) 37.5-25 MG tablet TAKE 1 TABLET BY MOUTH IN  THE MORNING   No facility-administered encounter medications on file as of 01/02/2020.    Current Diagnosis: Patient Active Problem List   Diagnosis Date Noted  . History of colonic polyps 11/05/2014  . Dyslipidemia 08/01/2006  . Depression, recurrent ([place]) 08/01/2006  . Essential hypertension 08/01/2006  . BPH (benign prostatic hyperplasia) 08/01/2006    Goals Addressed   None     Follow-Up:  Pharmacist Review  Maia Breslow, [place] Assistant [phone]

## 2020-01-02 NOTE — Chronic Care Management (AMB) (Signed)
Chronic Care Management Pharmacy  Name: Richard Sparks  MRN: [id number] DOB: 17-Aug-1945  Initial Planning Appointment: completed 01/02/20  Initial Questions: 1. Have you seen any other providers since your last visit? n/a 2. Any changes in your medicines or health? No   Chief Complaint/ HPI  Richard Sparks,  74 y.o. , male presents for their Initial CCM visit with the clinical pharmacist via telephone due to COVID-19 Pandemic.  PCP : [name], [name], MD  Their chronic conditions include: HTN, HLD, depression, allergic rhinitis, insomnia, BPH  Office Visits: -10/24/19 [name], MD: Patient presented for hypertension follow up. Amlodipine was increased from 5 to 10 mg in addition to Maxzide 37.5/25 mg due to uncontrolled BP. Patient has not been checking BP at home routinely. Follow up in 3 months.  -08/20/19 [name], MD: Patient presented for hypertension follow up. Patient was seen 6 weeks ago for HTN follow up and amlodipine 5 mg was added to Maxzide 37.5/25 mg due to elevated BP. BP remains elevated during this office visit and amlodipine was increased to 10 mg daily. Follow up in 6-8 weeks.  -07/05/19  Estela [name], MD: Patient presented for hypertension follow up. Patient was concerned that his home blood pressure monitor was giving him numbers that are higher than his usual.  Blood pressure in office today is 160/90, taken by machine is 165/92. Prescribed amlodipine 5 mg daily. Follow up in 6 weeks.  Consult Visit: -02/08/2019 Sundra Aland, RN (endoscopy center): Patient presented for colonoscopy prep due to personal history of colonic polyps.  -12/10/2018 Julien Girt (optometry): Patient presented for eye exam. Unable to access notes.  Medications: Outpatient Encounter Medications as of 01/02/2020  Medication Sig  . amLODipine (NORVASC) 10 MG tablet Take 1 tablet (10 mg total) by mouth daily.  Marland Kitchen b complex vitamins tablet  Take 1 tablet by mouth daily.  Marland Kitchen buPROPion (WELLBUTRIN XL) 150 MG 24 hr tablet TAKE 1 TABLET BY MOUTH  DAILY  . Cholecalciferol (VITAMIN D) 1000 UNITS capsule Take 1,000 Units by mouth daily.    . fluticasone (FLONASE) 50 MCG/ACT nasal spray Place 1 spray into both nostrils daily.  Marland Kitchen loratadine (CLARITIN) 10 MG tablet Take 10 mg by mouth daily.  . Omega-3 Fatty Acids (FISH OIL) 1000 MG CAPS Take 3 capsules by mouth daily.    Marland Kitchen oxymetazoline (AFRIN) 0.05 % nasal spray Place 1 spray into both nostrils 2 (two) times daily as needed for congestion.  . pravastatin (PRAVACHOL) 40 MG tablet TAKE 1 TABLET BY MOUTH  DAILY  . temazepam (RESTORIL) 15 MG capsule TAKE 1 CAPSULE (15 MG TOTAL) BY MOUTH AT BEDTIME AS NEEDED. FOR SLEEP  . triamterene-hydrochlorothiazide (MAXZIDE-25) 37.5-25 MG tablet TAKE 1 TABLET BY MOUTH IN  THE MORNING   No facility-administered encounter medications on file as of 01/02/2020.   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 Patient has had to drive as far as [place] or [place], but this is unusual. Patient volunteers on Thursday afternoons at [employer] in [place]. Patient enjoys going to parks to walk the dogs and enjoys crossword puzzles and soduku.   Patient has not gone to the gym with the mask mandate but has been going for walks in the park with dogs.   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   Patient typically cooks at home but gets food out if close to lunch time with driving patients to appointments and after church. Patient tends to cook with 6 servings so he has leftovers. He cooks pasta, meatballs, ground beef, chicken, some meatless meals, eats a fair amount of veggies (barley & cabbage soup), wheat bran, oat bran, etc.  Patient reports sleep can be  sporadic as he slept 7 hours last night but only 4 hours worth of sleep another night. Typically he sleeps less than 5 hours and he did try an herbal product (melatonin, gaba) that did not help. Patient reports that he has trouble falling asleep or staying asleep and sometimes take naps.   Patient denies any current problems with medicines but is having some brain fog that he has noticed in the last year or so and wanted to know if any of his medications would contribute.   Current Diagnosis/Assessment:  Goals Addressed            This Visit's Progress   . Pharmacy Care Plan       CARE PLAN ENTRY (see longitudinal plan of care for additional care plan information)  Current Barriers:  . Chronic Disease Management support, education, and care coordination needs related to Hypertension, Hyperlipidemia, Depression, Allergic Rhinitis, and insomnia   Hypertension BP Readings from Last 3 Encounters:  10/24/19 118/70  08/20/19 140/90  07/05/19 (!) 165/92   . Pharmacist Clinical Goal(s): o Over the next 90 days, patient will work with PharmD and providers to maintain BP goal <140/90 . Current regimen:  . Amlodipine 10 mg 1 tablet daily . Triamterene-HCTZ 37.5-25 mg 1 tablet daily in the morning . Interventions: o Discussed DASH eating plan recommendations: . Emphasizes vegetables, fruits, and whole-grains . Includes fat-free or low-fat dairy products, fish, poultry, beans, nuts, and vegetable oils . Limits foods that are high in saturated fat. These foods include fatty meats, full-fat dairy products, and tropical oils such as coconut, palm kernel, and palm oils. . Limits sugar-sweetened beverages and sweets . Limiting sodium intake to < 1500 mg/day o Discussed recommendations for moderate aerobic exercise for 150 minutes/week spread out over 5 days for heart healthy lifestyle o Recommended for patient to continue checking blood pressure at home to make sure medications are  working . Patient self care activities - Over the next 90 days, patient will: o Check blood pressure weekly, document, and provide at future appointments o Ensure daily salt intake < 2300 mg/day  Hyperlipidemia Lab Results  Component Value Date/Time   LDLCALC 96 10/10/2013 08:10 AM   LDLDIRECT 139.0 11/22/2018 10:26 AM   . Pharmacist Clinical Goal(s): o Over the next 90 days, patient will work with PharmD and providers to achieve LDL goal < 100 . Current regimen:  . Pravastatin 40 mg 1 tablet daily . Omega 3 fatty acids 1000 mg 3 capsules daily . Interventions: o Lowering cholesterol through diet by: Marland Kitchen Limiting foods with cholesterol such as liver and other organ meats, egg yolks, shrimp, and whole milk dairy products . Avoiding saturated fats and trans fats and incorporating healthier fats, such as lean meat, nuts, and unsaturated oils like canola and olive oils . Eating foods with soluble fiber such as whole-grain cereals such as oatmeal and oat bran, fruits such as apples, bananas, oranges, pears, and prunes, legumes such as kidney beans, lentils, chick peas, black-eyed peas, and lima beans, and green leafy vegetables . Limiting alcohol intake  o Discussed how triglycerides can increase when: . Eating eat too much food . Eating high-fat foods such as fried foods, red meat, chicken skin, egg yolks, high-fat dairy, butter, lard, shortening, margarine, and fast food . Eating foods high in simple carbohydrates such as fresh and canned fruit, candy, ice cream and sweetened yogurt, sweetened drinks like juices, cereal, jams, foods and drinks with corn syrup, or sugar listed as the first ingredient . Drinking alcohol   . Patient self care activities - Over the next 90 days, patient will: o Continue current medication  Depression . Pharmacist Clinical Goal(s): o Over the next 90 days, patient will work with PharmD and providers to manage symptoms of depression . Current regimen:   o Bupropion XL 150 mg 1 tablet daily . Patient self care activities - Over the next 90 days, patient will: o Continue current medication  Allergic rhinitis . Pharmacist Clinical Goal(s) o Over the next 90 days, patient will work with PharmD and providers to manage symptoms of allergies . Current regimen:  . Claritin 10 mg 1 tablet daily . Flonase 50 mcg/act 1 spray in both nostrils daily . Afrin 0.05% 1 spray in both nostrils twice daily as needed . Interventions: o Discussed limiting Afrin use to 3 days in a row due to risk of rebound congestion and increased blood pressure . Patient self care activities - Over the next 90 days, patient will: o Continue current medications  Insomnia . Pharmacist Clinical Goal(s) o Over the next 90 days, patient will work with PharmD and providers to improve sleep . Current regimen:  o Temazepam 15 mg 1 tablet at bedtime as needed for sleep . Interventions: o Discussed that temazepam could be causing brain fog o Will discuss with Dr. Jerilee Hoh about switching sleep medications o Practicing good sleep hygiene by setting a sleep schedule and maintaining it, avoid excessive napping, following a nightly routine, avoiding screen time for 30-60 minutes before going to bed, and making the bedroom a cool, quiet and dark space . Patient self care activities - Over the next 90 days, patient will: o Continue current medications  Medication management . Pharmacist Clinical Goal(s): o Over the next 90 days, patient will work with PharmD and providers to maintain optimal medication adherence . Current pharmacy: [facility] . Interventions o Comprehensive medication review performed. o Continue current medication management strategy . Patient self care activities - Over the next 90 days, patient will: o Take medications as prescribed o Report any questions or concerns to PharmD and/or provider(s)  Initial goal documentation       Hypertension   BP goal  is:  <140/90  Office blood pressures are  BP Readings from Last 3 Encounters:  10/24/19 118/70  08/20/19 140/90  07/05/19 (!) 165/92   Patient checks BP at home infrequently Patient home BP readings are ranging: n/a  Patient has failed these meds in the past: none Patient is currently controlled on the following medications:  . Amlodipine 10 mg 1 tablet daily . Triamterene-HCTZ 37.5-25 mg 1 tablet daily in the morning  We discussed diet and exercise extensively Patient denies dizziness/lightheadedness. -Diet: leaves salt out of recipes and uses sea salt -Exercise: Patient is not exercising as much due to the gym being closed; walking 30-45 minutes with dogs -Discussed the importance of checking blood pressure regularly -DASH eating plan recommendations: . Emphasizes vegetables, fruits, and whole-grains . Includes fat-free or low-fat dairy products, fish, poultry, beans, nuts, and vegetable oils . Limits foods that  are high in saturated fat. These foods include fatty meats, full-fat dairy products, and tropical oils such as coconut, palm kernel, and palm oils. . Limits sugar-sweetened beverages and sweets . Limiting sodium intake to < 1500 mg/day  Plan  Continue current medications     Hyperlipidemia   LDL goal < 100  Last lipids Lab Results  Component Value Date   CHOL 227 (H) 11/22/2018   HDL 45.60 11/22/2018   LDLCALC 96 10/10/2013   LDLDIRECT 139.0 11/22/2018   TRIG 336.0 (H) 11/22/2018   CHOLHDL 5 11/22/2018   Hepatic Function Latest Ref Rng & Units 11/22/2018 11/20/2017 11/15/2016  Total Protein 6.0 - 8.3 g/dL 6.7 6.3 6.3  Albumin 3.5 - 5.2 g/dL 4.4 4.1 4.2  AST 0 - 37 U/L 20 17 18   ALT 0 - 53 U/L 23 19 19   Alk Phosphatase 39 - 117 U/L 59 57 48  Total Bilirubin 0.2 - 1.2 mg/dL 0.9 0.8 0.9  Bilirubin, Direct 0.0 - 0.3 mg/dL - - -     The 10-year ASCVD risk score Mikey Bussing DC Jr., et al., 2013) is: 24%   Values used to calculate the score:     Age: 53 years      Sex: Male     Is Non-Hispanic African American: No     Diabetic: No     Tobacco smoker: No     Systolic Blood Pressure: 567 mmHg     Is BP treated: Yes     HDL Cholesterol: 45.6 mg/dL     Total Cholesterol: 227 mg/dL   Patient has failed these meds in past: Lopid (gemfibrozil), atorvastatin (cost) Patient is currently uncontrolled on the following medications:  . Pravastatin 40 mg 1 tablet daily . Omega 3 fatty acids 1000 mg 3 capsules daily  We discussed:  diet and exercise extensively -Patient's family history is unknown for high cholesterol -Exercise -Diet: we discussed choosing lean meats; seafood; fat-free or low-fat milk, cheese, and yogurt; whole grains; and fruits and vegetables. -Lowering cholesterol through diet by: Marland Kitchen Limiting foods with cholesterol such as liver and other organ meats, egg yolks, shrimp, and whole milk dairy products . Avoiding saturated fats and trans fats and incorporating healthier fats, such as lean meat, nuts, and unsaturated oils like canola and olive oils . Eating foods with soluble fiber such as whole-grain cereals such as oatmeal and oat bran, fruits such as apples, bananas, oranges, pears, and prunes, legumes such as kidney beans, lentils, chick peas, black-eyed peas, and lima beans, and green leafy vegetables . Limiting alcohol intake -Discussed how triglycerides can increase when: . Eating eat too much food . Eating high-fat foods such as fried foods, red meat, chicken skin, egg yolks, high-fat dairy, butter, lard, shortening, margarine, and fast food . Eating foods high in simple carbohydrates such as fresh and canned fruit, candy, ice cream and sweetened yogurt, sweetened drinks like juices, cereal, jams, foods and drinks with corn syrup, or sugar listed as the first ingredient . Drinking alcohol   Plan Due for repeat lipid panel - recommend increasing to high intensity statin pending results of lipid panel.  Continue current  medications  Depression   Depression screen [facility] 2/9 08/20/2019 11/22/2018 11/20/2017  Decreased Interest 0 0 2  Down, Depressed, Hopeless 0 2 1  PHQ - 2 Score 0 2 3  Altered sleeping 1 1 0  Tired, decreased energy 1 1 0  Change in appetite 0 0 0  Feeling bad or failure about yourself  0 0 0  Trouble concentrating 0 0 0  Moving slowly or fidgety/restless 0 0 1  Suicidal thoughts 0 0 -  PHQ-9 Score 2 4 4   Difficult doing work/chores Not difficult at all Not difficult at all Not difficult at all    Patient has failed these meds in past: none Patient is currently controlled on the following medications:  . Bupropion XL 150 mg 1 tablet daily  Plan  Continue current medications   Allergic rhinitis   Patient has failed these meds in past: none Patient is currently controlled on the following medications:  . Claritin 10 mg 1 tablet daily . Flonase 50 mcg/act 1 spray in both nostrils daily . Afrin 0.05% 1 spray in both nostrils twice daily as needed  We discussed:  Limiting Afrin use to caution with rebound congestion and increasing BP; avoiding allergy triggers  Plan  Continue current medications   Insomnia   Patient has failed these meds in past: Rozerem (ineffective), Sonata (unknown) Patient is currently controlled on the following medications:  . Temazepam 15 mg 1 tablet at bedtime PRN sleep  We discussed:  Side effects of benzodiazepines include brain fog and memory loss; patient is open to trying other medications for sleep  Plan Will discuss with PCP about trying alternative safer medications for sleep.  Continue current medications   BPH   PSA  Date Value Ref Range Status  10/29/2014 1.53 0.10 - 4.00 ng/mL Final  10/10/2013 1.81 0.10 - 4.00 ng/mL Final  10/18/2011 1.13 0.10 - 4.00 ng/mL Final    Patient is currently controlled on the following medications:  . No medications  We discussed:  Patient reports that flow is hit or miss but does not feel as  though he needs medications for it at this time   Plan  Continue current medications  Vitamins   Patient is currently on the following medications:  Marland Kitchen Vitamin B complex 1 tablet daily . Vitamin D 1000 units 1 capsule daily  Plan  Continue current medications   Vaccines   Reviewed and discussed patient's vaccination history.    Immunization History  Administered Date(s) Administered  . Influenza,inj,Quad PF,6+ Mos 11/05/2014  . Janssen (J&J) SARS-COV-2 Vaccination 06/14/2019  . Pneumococcal Conjugate-13 04/26/2013  . Pneumococcal Polysaccharide-23 10/25/2011  . Tdap 09/07/2010  . Zoster 10/25/2012   Shingrix - 2 dose (2-6 months)  Influenza - wasn't getting it due to not being around people  Plan  Recommended patient receive influenza and Shingrix vaccine in office/at pharmacy.   Medication Management   Pt uses [facility] pharmacy for all medications Uses pill box? No - puts medicines on a plastic lid in 3 batches; uses a pillbox when he is not going to be home for lunch Pt endorses 100% compliance  We discussed: Current pharmacy is preferred with insurance plan and patient is satisfied with pharmacy services  Plan  Continue current medication management strategy   Follow up: 3 month phone visit  Jeni Salles, PharmD Clinical Pharmacist [facility] [place]

## 2020-01-03 ENCOUNTER — Telehealth: Payer: Medicare Other

## 2020-01-15 NOTE — Patient Instructions (Addendum)
Hi Richard Sparks,  It was lovely getting to meet you over the phone! As we discussed, I would continue checking your blood pressure at home a few times each month just to make sure your medications are working properly. Also, plan to discuss the brain fog with Dr. Jerilee Hoh at your next appointment and possibly switching sleep medications.  Give me a call if you have any questions or need anything before our next follow up!  Best, Maddie  Jeni Salles, PharmD Clinical Pharmacist [facility]   Visit Information  Goals Addressed            This Visit's Progress   . Pharmacy Care Plan       CARE PLAN ENTRY (see longitudinal plan of care for additional care plan information)  Current Barriers:  . Chronic Disease Management support, education, and care coordination needs related to Hypertension, Hyperlipidemia, Depression, Allergic Rhinitis, and insomnia   Hypertension BP Readings from Last 3 Encounters:  10/24/19 118/70  08/20/19 140/90  07/05/19 (!) 165/92   . Pharmacist Clinical Goal(s): o Over the next 90 days, patient will work with PharmD and providers to maintain BP goal <140/90 . Current regimen:  . Amlodipine 10 mg 1 tablet daily . Triamterene-HCTZ 37.5-25 mg 1 tablet daily in the morning . Interventions: o Discussed DASH eating plan recommendations: . Emphasizes vegetables, fruits, and whole-grains . Includes fat-free or low-fat dairy products, fish, poultry, beans, nuts, and vegetable oils . Limits foods that are high in saturated fat. These foods include fatty meats, full-fat dairy products, and tropical oils such as coconut, palm kernel, and palm oils. . Limits sugar-sweetened beverages and sweets . Limiting sodium intake to < 1500 mg/day o Discussed recommendations for moderate aerobic exercise for 150 minutes/week spread out over 5 days for heart healthy lifestyle o Recommended for patient to continue checking blood pressure at  home to make sure medications are working . Patient self care activities - Over the next 90 days, patient will: o Check blood pressure weekly, document, and provide at future appointments o Ensure daily salt intake < 2300 mg/day  Hyperlipidemia Lab Results  Component Value Date/Time   LDLCALC 96 10/10/2013 08:10 AM   LDLDIRECT 139.0 11/22/2018 10:26 AM   . Pharmacist Clinical Goal(s): o Over the next 90 days, patient will work with PharmD and providers to achieve LDL goal < 100 . Current regimen:  . Pravastatin 40 mg 1 tablet daily . Omega 3 fatty acids 1000 mg 3 capsules daily . Interventions: o Lowering cholesterol through diet by: Marland Kitchen Limiting foods with cholesterol such as liver and other organ meats, egg yolks, shrimp, and whole milk dairy products . Avoiding saturated fats and trans fats and incorporating healthier fats, such as lean meat, nuts, and unsaturated oils like canola and olive oils . Eating foods with soluble fiber such as whole-grain cereals such as oatmeal and oat bran, fruits such as apples, bananas, oranges, pears, and prunes, legumes such as kidney beans, lentils, chick peas, black-eyed peas, and lima beans, and green leafy vegetables . Limiting alcohol intake o Discussed how triglycerides can increase when: . Eating eat too much food . Eating high-fat foods such as fried foods, red meat, chicken skin, egg yolks, high-fat dairy, butter, lard, shortening, margarine, and fast food . Eating foods high in simple carbohydrates such as fresh and canned fruit, candy, ice cream and sweetened yogurt, sweetened drinks like juices, cereal, jams, foods and drinks with corn syrup, or sugar listed as the  first ingredient . Drinking alcohol   . Patient self care activities - Over the next 90 days, patient will: o Continue current medication  Depression . Pharmacist Clinical Goal(s): o Over the next 90 days, patient will work with PharmD and providers to manage symptoms of  depression . Current regimen:  o Bupropion XL 150 mg 1 tablet daily . Patient self care activities - Over the next 90 days, patient will: o Continue current medication  Allergic rhinitis . Pharmacist Clinical Goal(s) o Over the next 90 days, patient will work with PharmD and providers to manage symptoms of allergies . Current regimen:  . Claritin 10 mg 1 tablet daily . Flonase 50 mcg/act 1 spray in both nostrils daily . Afrin 0.05% 1 spray in both nostrils twice daily as needed . Interventions: o Discussed limiting Afrin use to 3 days in a row due to risk of rebound congestion and increased blood pressure . Patient self care activities - Over the next 90 days, patient will: o Continue current medications  Insomnia . Pharmacist Clinical Goal(s) o Over the next 90 days, patient will work with PharmD and providers to improve sleep . Current regimen:  o Temazepam 15 mg 1 tablet at bedtime as needed for sleep . Interventions: o Discussed that temazepam could be causing brain fog o Will discuss with Dr. Jerilee Hoh about switching sleep medications o Practicing good sleep hygiene by setting a sleep schedule and maintaining it, avoid excessive napping, following a nightly routine, avoiding screen time for 30-60 minutes before going to bed, and making the bedroom a cool, quiet and dark space . Patient self care activities - Over the next 90 days, patient will: o Continue current medications  Medication management . Pharmacist Clinical Goal(s): o Over the next 90 days, patient will work with PharmD and providers to maintain optimal medication adherence . Current pharmacy: [facility] . Interventions o Comprehensive medication review performed. o Continue current medication management strategy . Patient self care activities - Over the next 90 days, patient will: o Take medications as prescribed o Report any questions or concerns to PharmD and/or provider(s)  Initial goal documentation         Richard Sparks was given information about Chronic Care Management services today including:  1. CCM service includes personalized support from designated clinical staff supervised by his physician, including individualized plan of care and coordination with other care providers 2. 24/7 contact phone numbers for assistance for urgent and routine care needs. 3. Standard insurance, coinsurance, copays and deductibles apply for chronic care management only during months in which we provide at least 20 minutes of these services. Most insurances cover these services at 100%, however patients may be responsible for any copay, coinsurance and/or deductible if applicable. This service may help you avoid the need for more expensive face-to-face services. 4. Only one practitioner may furnish and bill the service in a calendar month. 5. The patient may stop CCM services at any time (effective at the end of the month) by phone call to the office staff.  Patient agreed to services and verbal consent obtained.   The patient verbalized understanding of instructions provided today and agreed to receive a mailed copy of patient instruction and/or educational materials. Telephone follow up appointment with pharmacy team member scheduled for: 3 months   Insomnia Insomnia is a sleep disorder that makes it difficult to fall asleep or stay asleep. Insomnia can cause fatigue, low energy, difficulty concentrating, mood swings, and poor performance at work or  school. There are three different ways to classify insomnia:  Difficulty falling asleep.  Difficulty staying asleep.  Waking up too early in the morning. Any type of insomnia can be long-term (chronic) or short-term (acute). Both are common. Short-term insomnia usually lasts for three months or less. Chronic insomnia occurs at least three times a week for longer than three months. What are the causes? Insomnia may be caused by another condition, situation, or  substance, such as:  Anxiety.  Certain medicines.  Gastroesophageal reflux disease (GERD) or other gastrointestinal conditions.  Asthma or other breathing conditions.  Restless legs syndrome, sleep apnea, or other sleep disorders.  Chronic pain.  Menopause.  Stroke.  Abuse of alcohol, tobacco, or illegal drugs.  Mental health conditions, such as depression.  Caffeine.  Neurological disorders, such as Alzheimer's disease.  An overactive thyroid (hyperthyroidism). Sometimes, the cause of insomnia may not be known. What increases the risk? Risk factors for insomnia include:  Gender. Women are affected more often than men.  Age. Insomnia is more common as you get older.  Stress.  Lack of exercise.  Irregular work schedule or working night shifts.  Traveling between different time zones.  Certain medical and mental health conditions. What are the signs or symptoms? If you have insomnia, the main symptom is having trouble falling asleep or having trouble staying asleep. This may lead to other symptoms, such as:  Feeling fatigued or having low energy.  Feeling nervous about going to sleep.  Not feeling rested in the morning.  Having trouble concentrating.  Feeling irritable, anxious, or depressed. How is this diagnosed? This condition may be diagnosed based on:  Your symptoms and medical history. Your health care provider may ask about: ? Your sleep habits. ? Any medical conditions you have. ? Your mental health.  A physical exam. How is this treated? Treatment for insomnia depends on the cause. Treatment may focus on treating an underlying condition that is causing insomnia. Treatment may also include:  Medicines to help you sleep.  Counseling or therapy.  Lifestyle adjustments to help you sleep better. Follow these instructions at home: Eating and drinking   Limit or avoid alcohol, caffeinated beverages, and cigarettes, especially close to  bedtime. These can disrupt your sleep.  Do not eat a large meal or eat spicy foods right before bedtime. This can lead to digestive discomfort that can make it hard for you to sleep. Sleep habits   Keep a sleep diary to help you and your health care provider figure out what could be causing your insomnia. Write down: ? When you sleep. ? When you wake up during the night. ? How well you sleep. ? How rested you feel the next day. ? Any side effects of medicines you are taking. ? What you eat and drink.  Make your bedroom a dark, comfortable place where it is easy to fall asleep. ? Put up shades or blackout curtains to block light from outside. ? Use a white noise machine to block noise. ? Keep the temperature cool.  Limit screen use before bedtime. This includes: ? Watching TV. ? Using your smartphone, tablet, or computer.  Stick to a routine that includes going to bed and waking up at the same times every day and night. This can help you fall asleep faster. Consider making a quiet activity, such as reading, part of your nighttime routine.  Try to avoid taking naps during the day so that you sleep better at night.  Get out  of bed if you are still awake after 15 minutes of trying to sleep. Keep the lights down, but try reading or doing a quiet activity. When you feel sleepy, go back to bed. General instructions  Take over-the-counter and prescription medicines only as told by your health care provider.  Exercise regularly, as told by your health care provider. Avoid exercise starting several hours before bedtime.  Use relaxation techniques to manage stress. Ask your health care provider to suggest some techniques that may work well for you. These may include: ? Breathing exercises. ? Routines to release muscle tension. ? Visualizing peaceful scenes.  Make sure that you drive carefully. Avoid driving if you feel very sleepy.  Keep all follow-up visits as told by your health care  provider. This is important. Contact a health care provider if:  You are tired throughout the day.  You have trouble in your daily routine due to sleepiness.  You continue to have sleep problems, or your sleep problems get worse. Get help right away if:  You have serious thoughts about hurting yourself or someone else. If you ever feel like you may hurt yourself or others, or have thoughts about taking your own life, get help right away. You can go to your nearest emergency department or call:  Your local emergency services (911 in the [place]  A suicide crisis helpline, such as the [place] at (806)670-8210. This is open 24 hours a day. Summary  Insomnia is a sleep disorder that makes it difficult to fall asleep or stay asleep.  Insomnia can be long-term (chronic) or short-term (acute).  Treatment for insomnia depends on the cause. Treatment may focus on treating an underlying condition that is causing insomnia.  Keep a sleep diary to help you and your health care provider figure out what could be causing your insomnia. This information is not intended to replace advice given to you by your health care provider. Make sure you discuss any questions you have with your health care provider. Document Revised: 02/03/2017 Document Reviewed: 12/01/2016 [employer]vier Patient Education  2020 [employer].

## 2020-01-16 ENCOUNTER — Other Ambulatory Visit: Payer: Self-pay | Admitting: Internal Medicine

## 2020-01-16 DIAGNOSIS — I10 Essential (primary) hypertension: Secondary | ICD-10-CM

## 2020-02-13 ENCOUNTER — Other Ambulatory Visit: Payer: Self-pay

## 2020-02-13 ENCOUNTER — Encounter: Payer: Self-pay | Admitting: Internal Medicine

## 2020-02-13 ENCOUNTER — Ambulatory Visit (INDEPENDENT_AMBULATORY_CARE_PROVIDER_SITE_OTHER): Payer: Medicare Other | Admitting: Internal Medicine

## 2020-02-13 VITALS — BP 110/70 | HR 59 | Temp 97.7°F | Ht 70.5 in | Wt 184.5 lb

## 2020-02-13 DIAGNOSIS — N400 Enlarged prostate without lower urinary tract symptoms: Secondary | ICD-10-CM

## 2020-02-13 DIAGNOSIS — Z86010 Personal history of colonic polyps: Secondary | ICD-10-CM | POA: Diagnosis not present

## 2020-02-13 DIAGNOSIS — R6889 Other general symptoms and signs: Secondary | ICD-10-CM

## 2020-02-13 DIAGNOSIS — F339 Major depressive disorder, recurrent, unspecified: Secondary | ICD-10-CM | POA: Diagnosis not present

## 2020-02-13 DIAGNOSIS — Z0000 Encounter for general adult medical examination without abnormal findings: Secondary | ICD-10-CM | POA: Diagnosis not present

## 2020-02-13 DIAGNOSIS — I10 Essential (primary) hypertension: Secondary | ICD-10-CM | POA: Diagnosis not present

## 2020-02-13 DIAGNOSIS — E785 Hyperlipidemia, unspecified: Secondary | ICD-10-CM

## 2020-02-13 LAB — LIPID PANEL
Cholesterol: 197 mg/dL (ref 0–200)
HDL: 42.4 mg/dL (ref >39.00)
NonHDL: 155.02
Total CHOL/HDL Ratio: 5
Triglycerides: 253 mg/dL — ABNORMAL HIGH (ref 0.0–149.0)
VLDL: 50.6 mg/dL — ABNORMAL HIGH (ref 0.0–40.0)

## 2020-02-13 LAB — COMPREHENSIVE METABOLIC PANEL
ALT: 24 U/L (ref 0–53)
AST: 19 U/L (ref 0–37)
Albumin: 4.4 g/dL (ref 3.5–5.2)
Alkaline Phosphatase: 56 U/L (ref 39–117)
BUN: 22 mg/dL (ref 6–23)
CO2: 31 mEq/L (ref 19–32)
Calcium: 9.7 mg/dL (ref 8.4–10.5)
Chloride: 103 mEq/L (ref 96–112)
Creatinine, Ser: 1.23 mg/dL (ref 0.40–1.50)
GFR: 58.25 mL/min — ABNORMAL LOW (ref >60.00)
Glucose, Bld: 103 mg/dL — ABNORMAL HIGH (ref 70–99)
Potassium: 3.6 mEq/L (ref 3.5–5.1)
Sodium: 142 mEq/L (ref 135–145)
Total Bilirubin: 0.9 mg/dL (ref 0.2–1.2)
Total Protein: 6.7 g/dL (ref 6.0–8.3)

## 2020-02-13 LAB — VITAMIN B12: Vitamin B-12: 455 pg/mL (ref 211–911)

## 2020-02-13 LAB — CBC WITH DIFFERENTIAL/PLATELET
Basophils Absolute: 0.1 10*3/uL (ref 0.0–0.1)
Basophils Relative: 0.8 % (ref 0.0–3.0)
Eosinophils Absolute: 0.3 10*3/uL (ref 0.0–0.7)
Eosinophils Relative: 3.6 % (ref 0.0–5.0)
HCT: 46.3 % (ref 39.0–52.0)
Hemoglobin: 15.9 g/dL (ref 13.0–17.0)
Lymphocytes Relative: 27.6 % (ref 12.0–46.0)
Lymphs Abs: 2.1 10*3/uL (ref 0.7–4.0)
MCHC: 34.3 g/dL (ref 30.0–36.0)
MCV: 87 fl (ref 78.0–100.0)
Monocytes Absolute: 0.6 10*3/uL (ref 0.1–1.0)
Monocytes Relative: 8 % (ref 3.0–12.0)
Neutro Abs: 4.5 10*3/uL (ref 1.4–7.7)
Neutrophils Relative %: 60 % (ref 43.0–77.0)
Platelets: 237 10*3/uL (ref 150.0–400.0)
RBC: 5.32 Mil/uL (ref 4.22–5.81)
RDW: 13.5 % (ref 11.5–15.5)
WBC: 7.5 10*3/uL (ref 4.0–10.5)

## 2020-02-13 LAB — HEMOGLOBIN A1C: Hgb A1c MFr Bld: 5.6 % (ref 4.6–6.5)

## 2020-02-13 LAB — VITAMIN D 25 HYDROXY (VIT D DEFICIENCY, FRACTURES): VITD: 44.5 ng/mL (ref 30.00–100.00)

## 2020-02-13 LAB — PSA: PSA: 3.82 ng/mL (ref 0.10–4.00)

## 2020-02-13 LAB — TSH: TSH: 1.91 u[IU]/mL (ref 0.35–4.50)

## 2020-02-13 LAB — LDL CHOLESTEROL, DIRECT: Direct LDL: 127 mg/dL

## 2020-02-13 NOTE — Progress Notes (Signed)
Established Patient Office Visit     This visit occurred during the SARS-CoV-2 public health emergency.  Safety protocols were in place, including screening questions prior to the visit, additional usage of staff PPE, and extensive cleaning of exam room while observing appropriate contact time as indicated for disinfecting solutions.    CC/Reason for Visit: Annual preventive exam and subsequent Medicare wellness visit  HPI: Richard Sparks is a 74 y.o. male who is coming in today for the above mentioned reasons. Past Medical History is significant for: Hypertension that is currently well controlled, hyperlipidemia, depression, insomnia for which she takes diazepam.  He has routine eye and dental care.  He does not exercise routinely, he has noted no issues with hearing.  He had a Covid J and J vaccine, is not interested in a booster, he had his first shingles vaccine earlier this month and is scheduled for second in February, otherwise immunizations are up-to-date.  He had a colonoscopy in 2020.  He has been describing a sensation of "mental fog".  He becomes forgetful at times.  He describes an episode of driving where he made several wrong turns which is extremely unusual for him as he is typically very good with directions.  This bothers him quite a bit.   Past Medical/Surgical History: Past Medical History:  Diagnosis Date  . Allergy   . BENIGN PROSTATIC HYPERTROPHY 08/01/2006  . DEPRESSION 08/01/2006  . HYPERLIPIDEMIA 08/01/2006  . HYPERTENSION 08/01/2006    Past Surgical History:  Procedure Laterality Date  . COLONOSCOPY  last 12/31/2013  . NO PAST SURGERIES      Social History:  reports that he has never smoked. He has never used smokeless tobacco. He reports that he does not drink alcohol and does not use drugs.  Allergies: Allergies  Allergen Reactions  . Amoxicillin Rash    Family History:  Family History  Problem Relation Age of Onset  . Colon cancer Neg Hx   .  Esophageal cancer Neg Hx   . Rectal cancer Neg Hx   . Stomach cancer Neg Hx   . Colon polyps Neg Hx      Current Outpatient Medications:  .  amLODipine (NORVASC) 10 MG tablet, TAKE 1 TABLET BY MOUTH  DAILY, Disp: 90 tablet, Rfl: 1 .  b complex vitamins tablet, Take 1 tablet by mouth daily., Disp: , Rfl:  .  buPROPion (WELLBUTRIN XL) 150 MG 24 hr tablet, TAKE 1 TABLET BY MOUTH  DAILY, Disp: 90 tablet, Rfl: 1 .  Cholecalciferol (VITAMIN D) 1000 UNITS capsule, Take 1,000 Units by mouth daily., Disp: , Rfl:  .  fluticasone (FLONASE) 50 MCG/ACT nasal spray, Place 1 spray into both nostrils daily., Disp: 16 g, Rfl: 5 .  loratadine (CLARITIN) 10 MG tablet, Take 10 mg by mouth daily., Disp: , Rfl:  .  Omega-3 Fatty Acids (FISH OIL) 1000 MG CAPS, Take 3 capsules by mouth daily., Disp: , Rfl:  .  oxymetazoline (AFRIN) 0.05 % nasal spray, Place 1 spray into both nostrils 2 (two) times daily as needed for congestion., Disp: , Rfl:  .  pravastatin (PRAVACHOL) 40 MG tablet, TAKE 1 TABLET BY MOUTH  DAILY, Disp: 90 tablet, Rfl: 1 .  temazepam (RESTORIL) 15 MG capsule, TAKE 1 CAPSULE (15 MG TOTAL) BY MOUTH AT BEDTIME AS NEEDED. FOR SLEEP, Disp: 90 capsule, Rfl: 0 .  triamterene-hydrochlorothiazide (MAXZIDE-25) 37.5-25 MG tablet, TAKE 1 TABLET BY MOUTH IN  THE MORNING, Disp: 90 tablet, Rfl: 1  Review of Systems:  Constitutional: Denies fever, chills, diaphoresis, appetite change and fatigue.  HEENT: Denies photophobia, eye pain, redness, hearing loss, ear pain, congestion, sore throat, rhinorrhea, sneezing, mouth sores, trouble swallowing, neck pain, neck stiffness and tinnitus.   Respiratory: Denies SOB, DOE, cough, chest tightness,  and wheezing.   Cardiovascular: Denies chest pain, palpitations and leg swelling.  Gastrointestinal: Denies nausea, vomiting, abdominal pain, diarrhea, constipation, blood in stool and abdominal distention.  Genitourinary: Denies dysuria, urgency, frequency, hematuria, flank  pain and difficulty urinating.  Endocrine: Denies: hot or cold intolerance, sweats, changes in hair or nails, polyuria, polydipsia. Musculoskeletal: Denies myalgias, back pain, joint swelling, arthralgias and gait problem.  Skin: Denies pallor, rash and wound.  Neurological: Denies dizziness, seizures, syncope, weakness, light-headedness, numbness and headaches.  Hematological: Denies adenopathy. Easy bruising, personal or family bleeding history  Psychiatric/Behavioral: Denies suicidal ideation, mood changes, confusion, nervousness, sleep disturbance and agitation    Physical Exam: Vitals:   02/13/20 0658  BP: 110/70  Pulse: (!) 59  Temp: 97.7 F (36.5 C)  TempSrc: Oral  SpO2: 98%  Weight: 184 lb 8 oz (83.7 kg)  Height: 5' 10.5" (1.791 m)    Body mass index is 26.1 kg/m.   Constitutional: NAD, calm, comfortable Eyes: PERRL, lids and conjunctivae normal ENMT: Mucous membranes are moist. Posterior pharynx clear of any exudate or lesions. Normal dentition. Tympanic membrane is pearly white, no erythema or bulging. Neck: normal, supple, no masses, no thyromegaly Respiratory: clear to auscultation bilaterally, no wheezing, no crackles. Normal respiratory effort. No accessory muscle use.  Cardiovascular: Regular rate and rhythm, no murmurs / rubs / gallops. No extremity edema. 2+ pedal pulses. No carotid bruits.  Abdomen: no tenderness, no masses palpated. No hepatosplenomegaly. Bowel sounds positive.  Musculoskeletal: no clubbing / cyanosis. No joint deformity upper and lower extremities. Good ROM, no contractures. Normal muscle tone.  Skin: no rashes, lesions, ulcers. No induration Neurologic: CN 2-12 grossly intact. Sensation intact, DTR normal. Strength 5/5 in all 4.  Psychiatric: Normal judgment and insight. Alert and oriented x 3. Normal mood.    Subsequent Medicare wellness visit   1. Risk factors, based on past  M,S,F -cardiovascular disease risk factors include age,  gender, history of hypertension, history of hyperlipidemia   2.  Physical activities: Not very physically active   3.  Depression/mood:  Mood is stable despite history of depression   4.  Hearing:  No perceived issues   5.  ADL's: Independent in all ADLs   6.  Fall risk:  Low fall risk   7.  Home safety: No problems identified   8.  Height weight, and visual acuity: height and weight as above, vision:   Visual Acuity Screening   Right eye Left eye Both eyes  Without correction: 20/25 20/32 20/20  With correction:        9.  Counseling:  Advised to increase physical activity to at least 30 minutes 3 days a week of moderate intensity aerobic activity.   10. Lab orders based on risk factors: Laboratory update will be reviewed   11. Referral :  None today   12. Care plan:  Follow-up with me in 6 months or sooner as needed   13. Cognitive assessment:  No cognitive impairment identified although patient describes subjective forgetfulness   14. Screening: Patient provided with a written and personalized 5-10 year screening schedule in the AVS.   yes   15. Provider List Update:   PCP only  16.  Advance Directives: Full code   [place] Office Visit from 02/13/2020 in [place] at [place]  PHQ-9 Total Score 5      Fall Risk  02/13/2020 11/22/2018 11/20/2017 11/10/2015 11/05/2014  Falls in the past year? 0 0 No No No  Number falls in past yr: 0 0 - - -  Injury with Fall? 0 0 - - -     Impression and Plan:  Encounter for preventive health examination -He has routine eye and dental care. -Advised to get Covid booster, scheduled for second shingles vaccine in February, otherwise immunizations are up-to-date. -Screening labs today. -Healthy lifestyle discussed in detail including necessity to increase physical activity. -PSA today for prostate cancer screening. -He had a colonoscopy in December 2020.  Essential hypertension  -Currently well controlled on  amlodipine and triamterene/HCTZ.  Dyslipidemia  - Plan: Lipid panel -Last LDL was 139 in 2020, he is currently on pravastatin 40 mg daily.  Depression, recurrent ([place]) -Mood is stable, he is treated with daily Wellbutrin.  History of colonic polyps  Benign prostatic hyperplasia without lower urinary tract symptoms  - Plan: PSA  Forgetfulness  -This is his main complaint today. -We have first discussed medications.  He takes temazepam for insomnia.  After questioning, I believe he is probably taking it too late at around 11 PM or even later.  This might be contributing to increase sedation during the morning time. -We have also discussed may be taking Wellbutrin at nighttime as in rare occasions it can cause sedation. -Check TSH, vitamin B12 and vitamin D which can be linked to chronic fatigue and memory issues. -His PHQ-9 today is 5.  He has a longstanding issue of depression but he states his mood is not worse in recent months. -Consider MMSE next visit.   Patient Instructions  -Nice seeing you today!!  -Lab work today; will notify you once results are available.  -Consider getting your COVID booster.  -Schedule follow up in 6 months or sooner as needed.   Preventive Care 68 Years and Older, Male Preventive care refers to lifestyle choices and visits with your health care provider that can promote health and wellness. This includes:  A yearly physical exam. This is also called an annual well check.  Regular dental and eye exams.  Immunizations.  Screening for certain conditions.  Healthy lifestyle choices, such as diet and exercise. What can I expect for my preventive care visit? Physical exam Your health care provider will check:  Height and weight. These may be used to calculate body mass index (BMI), which is a measurement that tells if you are at a healthy weight.  Heart rate and blood pressure.  Your skin for abnormal spots. Counseling Your health care  provider may ask you questions about:  Alcohol, tobacco, and drug use.  Emotional well-being.  Home and relationship well-being.  Sexual activity.  Eating habits.  History of falls.  Memory and ability to understand (cognition).  Work and work Statistician. What immunizations do I need?  Influenza (flu) vaccine  This is recommended every year. Tetanus, diphtheria, and pertussis (Tdap) vaccine  You may need a Td booster every 10 years. Varicella (chickenpox) vaccine  You may need this vaccine if you have not already been vaccinated. Zoster (shingles) vaccine  You may need this after age 41. Pneumococcal conjugate (PCV13) vaccine  One dose is recommended after age 51. Pneumococcal polysaccharide (PPSV23) vaccine  One dose is recommended after age 75. Measles, mumps, and rubella (MMR) vaccine  You may need at least one dose of MMR if you were born in 1957 or later. You may also need a second dose. Meningococcal conjugate (MenACWY) vaccine  You may need this if you have certain conditions. Hepatitis A vaccine  You may need this if you have certain conditions or if you travel or work in places where you may be exposed to hepatitis A. Hepatitis B vaccine  You may need this if you have certain conditions or if you travel or work in places where you may be exposed to hepatitis B. Haemophilus influenzae type b (Hib) vaccine  You may need this if you have certain conditions. You may receive vaccines as individual doses or as more than one vaccine together in one shot (combination vaccines). Talk with your health care provider about the risks and benefits of combination vaccines. What tests do I need? Blood tests  Lipid and cholesterol levels. These may be checked every 5 years, or more frequently depending on your overall health.  Hepatitis C test.  Hepatitis B test. Screening  Lung cancer screening. You may have this screening every year starting at age 56 if you  have a 30-pack-year history of smoking and currently smoke or have quit within the past 15 years.  Colorectal cancer screening. All adults should have this screening starting at age 63 and continuing until age 54. Your health care provider may recommend screening at age 4 if you are at increased risk. You will have tests every 1-10 years, depending on your results and the type of screening test.  Prostate cancer screening. Recommendations will vary depending on your family history and other risks.  Diabetes screening. This is done by checking your blood sugar (glucose) after you have not eaten for a while (fasting). You may have this done every 1-3 years.  Abdominal aortic aneurysm (AAA) screening. You may need this if you are a current or former smoker.  Sexually transmitted disease (STD) testing. Follow these instructions at home: Eating and drinking  Eat a diet that includes fresh fruits and vegetables, whole grains, lean protein, and low-fat dairy products. Limit your intake of foods with high amounts of sugar, saturated fats, and salt.  Take vitamin and mineral supplements as recommended by your health care provider.  Do not drink alcohol if your health care provider tells you not to drink.  If you drink alcohol: ? Limit how much you have to 0-2 drinks a day. ? Be aware of how much alcohol is in your drink. In the [place] one drink equals one 12 oz bottle of beer (355 mL), one 5 oz glass of wine (148 mL), or one 1 oz glass of hard liquor (44 mL). Lifestyle  Take daily care of your teeth and gums.  Stay active. Exercise for at least 30 minutes on 5 or more days each week.  Do not use any products that contain nicotine or tobacco, such as cigarettes, e-cigarettes, and chewing tobacco. If you need help quitting, ask your health care provider.  If you are sexually active, practice safe sex. Use a condom or other form of protection to prevent STIs (sexually transmitted  infections).  Talk with your health care provider about taking a low-dose aspirin or statin. What's next?  Visit your health care provider once a year for a well check visit.  Ask your health care provider how often you should have your eyes and teeth checked.  Stay up to date on all vaccines. This information is not intended  to replace advice given to you by your health care provider. Make sure you discuss any questions you have with your health care provider. Document Revised: 02/15/2018 Document Reviewed: 02/15/2018 Elsevier Patient Education  2020 [place], [place] [facility]

## 2020-02-13 NOTE — Patient Instructions (Signed)
-Nice seeing you today!!  -Lab work today; will notify you once results are available.  -Consider getting your COVID booster.  -Schedule follow up in 6 months or sooner as needed.   Preventive Care 73 Years and Older, Male Preventive care refers to lifestyle choices and visits with your health care provider that can promote health and wellness. This includes:  A yearly physical exam. This is also called an annual well check.  Regular dental and eye exams.  Immunizations.  Screening for certain conditions.  Healthy lifestyle choices, such as diet and exercise. What can I expect for my preventive care visit? Physical exam Your health care provider will check:  Height and weight. These may be used to calculate body mass index (BMI), which is a measurement that tells if you are at a healthy weight.  Heart rate and blood pressure.  Your skin for abnormal spots. Counseling Your health care provider may ask you questions about:  Alcohol, tobacco, and drug use.  Emotional well-being.  Home and relationship well-being.  Sexual activity.  Eating habits.  History of falls.  Memory and ability to understand (cognition).  Work and work Statistician. What immunizations do I need?  Influenza (flu) vaccine  This is recommended every year. Tetanus, diphtheria, and pertussis (Tdap) vaccine  You may need a Td booster every 10 years. Varicella (chickenpox) vaccine  You may need this vaccine if you have not already been vaccinated. Zoster (shingles) vaccine  You may need this after age 15. Pneumococcal conjugate (PCV13) vaccine  One dose is recommended after age 77. Pneumococcal polysaccharide (PPSV23) vaccine  One dose is recommended after age 73. Measles, mumps, and rubella (MMR) vaccine  You may need at least one dose of MMR if you were born in 1957 or later. You may also need a second dose. Meningococcal conjugate (MenACWY) vaccine  You may need this if you have  certain conditions. Hepatitis A vaccine  You may need this if you have certain conditions or if you travel or work in places where you may be exposed to hepatitis A. Hepatitis B vaccine  You may need this if you have certain conditions or if you travel or work in places where you may be exposed to hepatitis B. Haemophilus influenzae type b (Hib) vaccine  You may need this if you have certain conditions. You may receive vaccines as individual doses or as more than one vaccine together in one shot (combination vaccines). Talk with your health care provider about the risks and benefits of combination vaccines. What tests do I need? Blood tests  Lipid and cholesterol levels. These may be checked every 5 years, or more frequently depending on your overall health.  Hepatitis C test.  Hepatitis B test. Screening  Lung cancer screening. You may have this screening every year starting at age 73 if you have a 30-pack-year history of smoking and currently smoke or have quit within the past 15 years.  Colorectal cancer screening. All adults should have this screening starting at age 32 and continuing until age 91. Your health care provider may recommend screening at age 68 if you are at increased risk. You will have tests every 1-10 years, depending on your results and the type of screening test.  Prostate cancer screening. Recommendations will vary depending on your family history and other risks.  Diabetes screening. This is done by checking your blood sugar (glucose) after you have not eaten for a while (fasting). You may have this done every 1-3 years.  Abdominal aortic aneurysm (AAA) screening. You may need this if you are a current or former smoker.  Sexually transmitted disease (STD) testing. Follow these instructions at home: Eating and drinking  Eat a diet that includes fresh fruits and vegetables, whole grains, lean protein, and low-fat dairy products. Limit your intake of foods with  high amounts of sugar, saturated fats, and salt.  Take vitamin and mineral supplements as recommended by your health care provider.  Do not drink alcohol if your health care provider tells you not to drink.  If you drink alcohol: ? Limit how much you have to 0-2 drinks a day. ? Be aware of how much alcohol is in your drink. In the [place] one drink equals one 12 oz bottle of beer (355 mL), one 5 oz glass of wine (148 mL), or one 1 oz glass of hard liquor (44 mL). Lifestyle  Take daily care of your teeth and gums.  Stay active. Exercise for at least 30 minutes on 5 or more days each week.  Do not use any products that contain nicotine or tobacco, such as cigarettes, e-cigarettes, and chewing tobacco. If you need help quitting, ask your health care provider.  If you are sexually active, practice safe sex. Use a condom or other form of protection to prevent STIs (sexually transmitted infections).  Talk with your health care provider about taking a low-dose aspirin or statin. What's next?  Visit your health care provider once a year for a well check visit.  Ask your health care provider how often you should have your eyes and teeth checked.  Stay up to date on all vaccines. This information is not intended to replace advice given to you by your health care provider. Make sure you discuss any questions you have with your health care provider. Document Revised: 02/15/2018 Document Reviewed: 02/15/2018 Elsevier Patient Education  2020 [employer].

## 2020-02-17 ENCOUNTER — Other Ambulatory Visit: Payer: Self-pay | Admitting: Internal Medicine

## 2020-03-24 ENCOUNTER — Ambulatory Visit: Payer: Medicare Other | Admitting: Pharmacist

## 2020-03-24 DIAGNOSIS — E785 Hyperlipidemia, unspecified: Secondary | ICD-10-CM

## 2020-03-24 DIAGNOSIS — I10 Essential (primary) hypertension: Secondary | ICD-10-CM

## 2020-03-24 NOTE — Chronic Care Management (AMB) (Signed)
Chronic Care Management Pharmacy  Name: Richard Sparks  MRN: [id number] DOB: 08/27/1945  Initial Questions: 1. Have you seen any other providers since your last visit? n/a 2. Any changes in your medicines or health? No   Chief Complaint/ HPI  Richard Sparks,  75 y.o. , male presents for their Follow-Up CCM visit with the clinical pharmacist via telephone due to COVID-19 Pandemic.  PCP : [name], [name], MD  Their chronic conditions include: HTN, HLD, depression, allergic rhinitis, insomnia, BPH  Office Visits: -02/13/20 [name], MD: Patient presented for annual exam. Follow up in 6 months.   -10/24/19 Estela [name], MD: Patient presented for hypertension follow up. Amlodipine was increased from 5 to 10 mg in addition to Maxzide 37.5/25 mg due to uncontrolled BP. Patient has not been checking BP at home routinely. Follow up in 3 months.  -08/20/19 [name], MD: Patient presented for hypertension follow up. Patient was seen 6 weeks ago for HTN follow up and amlodipine 5 mg was added to Maxzide 37.5/25 mg due to elevated BP. BP remains elevated during this office visit and amlodipine was increased to 10 mg daily. Follow up in 6-8 weeks.  Consult Visit: -02/08/2019 Sundra Aland, RN (endoscopy center): Patient presented for colonoscopy prep due to personal history of colonic polyps.  -12/10/2018 Julien Girt (optometry): Patient presented for eye exam. Unable to access notes.  Medications: Outpatient Encounter Medications as of 03/24/2020  Medication Sig  . amLODipine (NORVASC) 10 MG tablet TAKE 1 TABLET BY MOUTH  DAILY  . b complex vitamins tablet Take 1 tablet by mouth daily.  Marland Kitchen buPROPion (WELLBUTRIN XL) 150 MG 24 hr tablet TAKE 1 TABLET BY MOUTH  DAILY  . Cholecalciferol (VITAMIN D) 1000 UNITS capsule Take 1,000 Units by mouth daily.  . fluticasone (FLONASE) 50 MCG/ACT nasal spray Place 1 spray into both nostrils daily.  Marland Kitchen loratadine  (CLARITIN) 10 MG tablet Take 10 mg by mouth daily.  . Omega-3 Fatty Acids (FISH OIL) 1000 MG CAPS Take 3 capsules by mouth daily.  Marland Kitchen oxymetazoline (AFRIN) 0.05 % nasal spray Place 1 spray into both nostrils 2 (two) times daily as needed for congestion.  . pravastatin (PRAVACHOL) 40 MG tablet TAKE 1 TABLET BY MOUTH  DAILY  . temazepam (RESTORIL) 15 MG capsule TAKE 1 CAPSULE (15 MG TOTAL) BY MOUTH AT BEDTIME AS NEEDED. FOR SLEEP  . triamterene-hydrochlorothiazide (MAXZIDE-25) 37.5-25 MG tablet TAKE 1 TABLET BY MOUTH IN  THE MORNING   No facility-administered encounter medications on file as of 03/24/2020.   Patient reports his biggest concern is with his blood pressure medication as he forgot to request a refill during his physical in December. His medication didn't arrive on time from mail order and he was out of town so he ended up taking it every other day because he ran out.   Current Diagnosis/Assessment:  Goals Addressed            This Visit's Progress   . Pharmacy Care Plan       CARE PLAN ENTRY (see longitudinal plan of care for additional care plan information)  Current Barriers:  . Chronic Disease Management support, education, and care coordination needs related to Hypertension, Hyperlipidemia, Depression, Allergic Rhinitis, and insomnia   Hypertension BP Readings from Last 3 Encounters:  02/13/20 110/70  10/24/19 118/70  08/20/19 140/90   . Pharmacist Clinical Goal(s): o Over the next 120 days, patient will work with PharmD and providers to  maintain BP goal <140/90 . Current regimen:  . Amlodipine 10 mg 1 tablet daily . Triamterene-HCTZ 37.5-25 mg 1 tablet daily in the morning . Interventions: o Discussed DASH eating plan recommendations: . Emphasizes vegetables, fruits, and whole-grains . Includes fat-free or low-fat dairy products, fish, poultry, beans, nuts, and vegetable oils . Limits foods that are high in saturated fat. These foods include fatty meats,  full-fat dairy products, and tropical oils such as coconut, palm kernel, and palm oils. . Limits sugar-sweetened beverages and sweets . Limiting sodium intake to < 1500 mg/day o Discussed recommendations for moderate aerobic exercise for 150 minutes/week spread out over 5 days for heart healthy lifestyle o Recommended for patient to continue checking blood pressure at home and setting an alarm to remember . Patient self care activities - Over the next 120 days, patient will: o Check blood pressure weekly, document, and provide at future appointments o Ensure daily salt intake < 2300 mg/day  Hyperlipidemia Lab Results  Component Value Date/Time   LDLCALC 96 10/10/2013 08:10 AM   LDLDIRECT 127.0 02/13/2020 07:42 AM   . Pharmacist Clinical Goal(s): o Over the next 90 days, patient will work with PharmD and providers to achieve LDL goal < 100 . Current regimen:  . Pravastatin 40 mg 1 tablet daily . Omega 3 fatty acids 1000 mg 3 capsules daily . Interventions: o Lowering cholesterol through diet by: Marland Kitchen Limiting foods with cholesterol such as liver and other organ meats, egg yolks, shrimp, and whole milk dairy products . Avoiding saturated fats and trans fats and incorporating healthier fats, such as lean meat, nuts, and unsaturated oils like canola and olive oils . Eating foods with soluble fiber such as whole-grain cereals such as oatmeal and oat bran, fruits such as apples, bananas, oranges, pears, and prunes, legumes such as kidney beans, lentils, chick peas, black-eyed peas, and lima beans, and green leafy vegetables . Limiting alcohol intake o Discussed how triglycerides can increase when: . Eating eat too much food . Eating high-fat foods such as fried foods, red meat, chicken skin, egg yolks, high-fat dairy, butter, lard, shortening, margarine, and fast food . Eating foods high in simple carbohydrates such as fresh and canned fruit, candy, ice cream and sweetened yogurt, sweetened  drinks like juices, cereal, jams, foods and drinks with corn syrup, or sugar listed as the first ingredient . Drinking alcohol   . Patient self care activities - Over the next 120 days, patient will: o Continue current medication  Depression . Pharmacist Clinical Goal(s): o Over the next 120 days, patient will work with PharmD and providers to manage symptoms of depression . Current regimen:  o Bupropion XL 150 mg 1 tablet daily . Patient self care activities - Over the next 120 days, patient will: o Continue current medication  Allergic rhinitis . Pharmacist Clinical Goal(s) o Over the next 120 days, patient will work with PharmD and providers to manage symptoms of allergies . Current regimen:  . Claritin 10 mg 1 tablet daily . Flonase 50 mcg/act 1 spray in both nostrils daily . Afrin 0.05% 1 spray in both nostrils twice daily as needed . Interventions: o Discussed limiting Afrin use to 3 days in a row due to risk of rebound congestion and increased blood pressure . Patient self care activities - Over the next 120 days, patient will: o Continue current medications  Insomnia . Pharmacist Clinical Goal(s) o Over the next 120 days, patient will work with PharmD and providers to improve sleep .  Current regimen:  o Temazepam 15 mg 1 tablet at bedtime as needed for sleep . Interventions: o Discussed that temazepam could be causing brain fog o Practicing good sleep hygiene by setting a sleep schedule and maintaining it, avoid excessive napping, following a nightly routine, avoiding screen time for 30-60 minutes before going to bed, and making the bedroom a cool, quiet and dark space . Patient self care activities - Over the next 120 days, patient will: o Continue current medications  Medication management . Pharmacist Clinical Goal(s): o Over the next 120 days, patient will work with PharmD and providers to maintain optimal medication adherence . Current pharmacy:  [facility] . Interventions o Comprehensive medication review performed. o Continue current medication management strategy . Patient self care activities - Over the next 120 days, patient will: o Take medications as prescribed o Report any questions or concerns to PharmD and/or provider(s)  Please see past updates related to this goal by clicking on the "Past Updates" button in the selected goal        Hypertension   BP goal is:  <140/90  Office blood pressures are  BP Readings from Last 3 Encounters:  02/13/20 110/70  10/24/19 118/70  08/20/19 140/90   Patient checks BP at home infrequently Patient home BP readings are ranging: 148/87  Patient has failed these meds in the past: none Patient is currently controlled on the following medications:  . Amlodipine 10 mg 1 tablet daily . Triamterene-HCTZ 37.5-25 mg 1 tablet daily in the morning  We discussed diet and exercise extensively Patient denies dizziness/lightheadedness. -Diet: leaves salt out of recipes and uses sea salt -Exercise: Patient is not exercising as much due to the gym being closed; walking 30-45 minutes with dogs -Discussed the importance of checking blood pressure regularly -DASH eating plan recommendations: . Emphasizes vegetables, fruits, and whole-grains . Includes fat-free or low-fat dairy products, fish, poultry, beans, nuts, and vegetable oils . Limits foods that are high in saturated fat. These foods include fatty meats, full-fat dairy products, and tropical oils such as coconut, palm kernel, and palm oils. . Limits sugar-sweetened beverages and sweets . Limiting sodium intake to < 1500 mg/day -Forgets to check BP frequently  Plan BP assessment in 3 weeks -  recommended setting an alarm and keep a log. Continue current medications     Hyperlipidemia   LDL goal < 100  Last lipids Lab Results  Component Value Date   CHOL 197 02/13/2020   HDL 42.40 02/13/2020   LDLCALC 96 10/10/2013    LDLDIRECT 127.0 02/13/2020   TRIG 253.0 (H) 02/13/2020   CHOLHDL 5 02/13/2020   Hepatic Function Latest Ref Rng & Units 02/13/2020 11/22/2018 11/20/2017  Total Protein 6.0 - 8.3 g/dL 6.7 6.7 6.3  Albumin 3.5 - 5.2 g/dL 4.4 4.4 4.1  AST 0 - 37 U/L 19 20 17   ALT 0 - 53 U/L 24 23 19   Alk Phosphatase 39 - 117 U/L 56 59 57  Total Bilirubin 0.2 - 1.2 mg/dL 0.9 0.9 0.8  Bilirubin, Direct 0.0 - 0.3 mg/dL - - -     The 10-year ASCVD risk score Mikey Bussing DC Jr., et al., 2013) is: 20.9%   Values used to calculate the score:     Age: 55 years     Sex: Male     Is Non-Hispanic African American: No     Diabetic: No     Tobacco smoker: No     Systolic Blood Pressure: 967 mmHg  Is BP treated: Yes     HDL Cholesterol: 42.4 mg/dL     Total Cholesterol: 197 mg/dL   Patient has failed these meds in past: Lopid (gemfibrozil), atorvastatin (cost-brand) Patient is currently uncontrolled on the following medications:  . Pravastatin 40 mg 1 tablet daily . Omega 3 fatty acids 1000 mg 3 capsules daily  We discussed:  diet and exercise extensively -Discussed how triglycerides can increase when: . Eating eat too much food . Eating high-fat foods such as fried foods, red meat, chicken skin, egg yolks, high-fat dairy, butter, lard, shortening, margarine, and fast food . Eating foods high in simple carbohydrates such as fresh and canned fruit, candy, ice cream and sweetened yogurt, sweetened drinks like juices, cereal, jams, foods and drinks with corn syrup, or sugar listed as the first ingredient . Drinking alcohol   Plan Recommend increasing to high intensity statin to achieve LDL goal < 100 and for additional TG lowering - will send message to PCP. Continue current medications  Depression   Depression screen [facility] 2/9 02/13/2020 08/20/2019 11/22/2018  Decreased Interest 1 0 0  Down, Depressed, Hopeless 1 0 2  PHQ - 2 Score 2 0 2  Altered sleeping 2 1 1   Tired, decreased energy 1 1 1   Change in appetite 0  0 0  Feeling bad or failure about yourself  0 0 0  Trouble concentrating 0 0 0  Moving slowly or fidgety/restless 0 0 0  Suicidal thoughts 0 0 0  PHQ-9 Score 5 2 4   Difficult doing work/chores Not difficult at all Not difficult at all Not difficult at all    Patient has failed these meds in past: none Patient is currently controlled on the following medications:  . Bupropion XL 150 mg 1 tablet daily  Plan  Continue current medications   Allergic rhinitis   Patient has failed these meds in past: none Patient is currently controlled on the following medications:  . Claritin 10 mg 1 tablet daily . Flonase 50 mcg/act 1 spray in both nostrils daily . Afrin 0.05% 1 spray in both nostrils twice daily as needed  We discussed:  Limiting Afrin use to caution with rebound congestion and increasing BP; avoiding allergy triggers  Plan  Continue current medications   Insomnia   Patient has failed these meds in past: Rozerem (ineffective), Sonata (unknown) Patient is currently controlled on the following medications:  . Temazepam 15 mg 1 tablet at bedtime PRN sleep  We discussed: patient did not try taking it any earlier and doesn't take it every day or see how this would help with brain fog - he is unsure if this is from this medication -Patient will try to keep a log to see if he notices how it affects his brain fog/memory  Plan Will discuss with PCP about trying alternative safer medications for sleep.  Continue current medications BPH   PSA  Date Value Ref Range Status  02/13/2020 3.82 0.10 - 4.00 ng/mL Final    Comment:    Test performed using Access Hybritech PSA Assay, a parmagnetic partical, chemiluminecent immunoassay.  10/29/2014 1.53 0.10 - 4.00 ng/mL Final  10/10/2013 1.81 0.10 - 4.00 ng/mL Final    Patient is currently controlled on the following medications:  . No medications  We discussed:  Patient reports that flow is hit or miss but does not feel as though he  needs medications for it at this time   Plan  Continue current medications  Vitamins  Patient is currently on the following medications:  Marland Kitchen Vitamin B complex 1 tablet daily . Vitamin D 1000 units 1 capsule daily  Plan  Continue current medications   Vaccines   Reviewed and discussed patient's vaccination history.    Immunization History  Administered Date(s) Administered  . Influenza, High Dose Seasonal PF 01/06/2020  . Influenza,inj,Quad PF,6+ Mos 11/05/2014  . Janssen (J&J) SARS-COV-2 Vaccination 06/14/2019  . Pneumococcal Conjugate-13 04/26/2013  . Pneumococcal Polysaccharide-23 10/25/2011  . Tdap 09/07/2010  . Zoster 10/25/2012  . Zoster Recombinat (Shingrix) 02/07/2020    Plan  Recommended patient receive influenza and 2nd dose of Shingrix vaccine in office/at pharmacy.   Medication Management   Pt uses [facility] pharmacy for all medications Uses pill box? No - puts medicines on a plastic lid in 3 batches; uses a pillbox when he is not going to be home for lunch Pt endorses 100% compliance  We discussed: Current pharmacy is preferred with insurance plan and patient is satisfied with pharmacy services  Plan  Continue current medication management strategy   Follow up: 4 month phone visit  Jeni Salles, PharmD Clinical Pharmacist [place] [facility] [place]

## 2020-04-06 NOTE — Patient Instructions (Addendum)
Hi Milos,  It was great to get to speak with you again today! Hopefull you are back on track with your blood pressure but I would recommend checking a few times a week so we can be absolutely sure. If you find you are having trouble remembering, I would consider setting an alarm to go off a couple times a week and picking a time you know you'll be home.  Please give me a call if you have any questions or need anything before our follow up!  Best, Maddie  Jeni Salles, PharmD [facility] Clinical Pharmacist [place] [facility] [place]   Visit Information  Goals Addressed   None     Patient verbalizes understanding of instructions provided today and agrees to view in [place].   Telephone follow up appointment with pharmacy team member scheduled for: 4 months   [name], [facility]  High Cholesterol  High cholesterol is a condition in which the blood has high levels of a white, waxy substance similar to fat (cholesterol). The liver makes all the cholesterol that the body needs. The human body needs small amounts of cholesterol to help build cells. A person gets extra or excess cholesterol from the food that he or she eats. The blood carries cholesterol from the liver to the rest of the body. If you have high cholesterol, deposits (plaques) may build up on the walls of your arteries. Arteries are the blood vessels that carry blood away from your heart. These plaques make the arteries narrow and stiff. Cholesterol plaques increase your risk for heart attack and stroke. Work with your health care provider to keep your cholesterol levels in a healthy range. What increases the risk? The following factors may make you more likely to develop this condition:  Eating foods that are high in animal fat (saturated fat) or cholesterol.  Being overweight.  Not getting enough exercise.  A family history of high cholesterol (familial hypercholesterolemia).  Use of tobacco  products.  Having diabetes. What are the signs or symptoms? There are no symptoms of this condition. How is this diagnosed? This condition may be diagnosed based on the results of a blood test.  If you are older than 74 years of age, your health care provider may check your cholesterol levels every 4-6 years.  You may be checked more often if you have high cholesterol or other risk factors for heart disease. The blood test for cholesterol measures:  "Bad" cholesterol, or LDL cholesterol. This is the main type of cholesterol that causes heart disease. The desired level is less than 100 mg/dL.  "Good" cholesterol, or HDL cholesterol. HDL helps protect against heart disease by cleaning the arteries and carrying the LDL to the liver for processing. The desired level for HDL is 60 mg/dL or higher.  Triglycerides. These are fats that your body can store or burn for energy. The desired level is less than 150 mg/dL.  Total cholesterol. This measures the total amount of cholesterol in your blood and includes LDL, HDL, and triglycerides. The desired level is less than 200 mg/dL. How is this treated? This condition may be treated with:  Diet changes. You may be asked to eat foods that have more fiber and less saturated fats or added sugar.  Lifestyle changes. These may include regular exercise, maintaining a healthy weight, and quitting use of tobacco products.  Medicines. These are given when diet and lifestyle changes have not worked. You may be prescribed a statin medicine to help lower  your cholesterol levels. Follow these instructions at home: Eating and drinking  Eat a healthy, balanced diet. This diet includes: ? Daily servings of a variety of fresh, frozen, or canned fruits and vegetables. ? Daily servings of whole grain foods that are rich in fiber. ? Foods that are low in saturated fats and trans fats. These include poultry and fish without skin, lean cuts of meat, and low-fat dairy  products. ? A variety of fish, especially oily fish that contain omega-3 fatty acids. Aim to eat fish at least 2 times a week.  Avoid foods and drinks that have added sugar.  Use healthy cooking methods, such as roasting, grilling, broiling, baking, poaching, steaming, and stir-frying. Do not fry your food except for stir-frying.   Lifestyle  Get regular exercise. Aim to exercise for a total of 150 minutes a week. Increase your activity level by doing activities such as gardening, walking, and taking the stairs.  Do not use any products that contain nicotine or tobacco, such as cigarettes, e-cigarettes, and chewing tobacco. If you need help quitting, ask your health care provider.   General instructions  Take over-the-counter and prescription medicines only as told by your health care provider.  Keep all follow-up visits as told by your health care provider. This is important. Where to find more information  American Heart Association: www.heart.org  National Heart, Lung, and Blood Institute: https://wilson-eaton.com/ Contact a health care provider if:  You have trouble achieving or maintaining a healthy diet or weight.  You are starting an exercise program.  You are unable to stop smoking. Get help right away if:  You have chest pain.  You have trouble breathing.  You have any symptoms of a stroke. "BE FAST" is an easy way to remember the main warning signs of a stroke: ? B - Balance. Signs are dizziness, sudden trouble walking, or loss of balance. ? E - Eyes. Signs are trouble seeing or a sudden change in vision. ? F - Face. Signs are sudden weakness or numbness of the face, or the face or eyelid drooping on one side. ? A - Arms. Signs are weakness or numbness in an arm. This happens suddenly and usually on one side of the body. ? S - Speech. Signs are sudden trouble speaking, slurred speech, or trouble understanding what people say. ? T - Time. Time to call emergency services.  Write down what time symptoms started.  You have other signs of a stroke, such as: ? A sudden, severe headache with no known cause. ? Nausea or vomiting. ? Seizure. These symptoms may represent a serious problem that is an emergency. Do not wait to see if the symptoms will go away. Get medical help right away. Call your local emergency services (911 in the [place] Do not drive yourself to the hospital. Summary  Cholesterol plaques increase your risk for heart attack and stroke. Work with your health care provider to keep your cholesterol levels in a healthy range.  Eat a healthy, balanced diet, get regular exercise, and maintain a healthy weight.  Do not use any products that contain nicotine or tobacco, such as cigarettes, e-cigarettes, and chewing tobacco.  Get help right away if you have any symptoms of a stroke. This information is not intended to replace advice given to you by your health care provider. Make sure you discuss any questions you have with your health care provider. Document Revised: 01/21/2019 Document Reviewed: 01/21/2019 Elsevier Patient Education  2021 [employer].

## 2020-04-08 ENCOUNTER — Other Ambulatory Visit: Payer: Self-pay | Admitting: Internal Medicine

## 2020-04-08 DIAGNOSIS — G4700 Insomnia, unspecified: Secondary | ICD-10-CM

## 2020-04-16 ENCOUNTER — Telehealth: Payer: Self-pay | Admitting: Pharmacist

## 2020-04-16 NOTE — Chronic Care Management (AMB) (Signed)
Chronic Care Management Pharmacy Assistant   Name: KANE KUSEK  MRN: [id number] DOB: 1945-07-08  Reason for Encounter: Disease State/Hypertension Adherence Call  PCP : [name], [name], MD  Allergies:   Allergies  Allergen Reactions  . Amoxicillin Rash    Medications: Outpatient Encounter Medications as of 04/16/2020  Medication Sig  . temazepam (RESTORIL) 15 MG capsule TAKE 1 CAPSULE (15 MG TOTAL) BY MOUTH AT BEDTIME AS NEEDED. FOR SLEEP  . amLODipine (NORVASC) 10 MG tablet TAKE 1 TABLET BY MOUTH  DAILY  . b complex vitamins tablet Take 1 tablet by mouth daily.  Marland Kitchen buPROPion (WELLBUTRIN XL) 150 MG 24 hr tablet TAKE 1 TABLET BY MOUTH  DAILY  . Cholecalciferol (VITAMIN D) 1000 UNITS capsule Take 1,000 Units by mouth daily.  . fluticasone (FLONASE) 50 MCG/ACT nasal spray Place 1 spray into both nostrils daily.  Marland Kitchen loratadine (CLARITIN) 10 MG tablet Take 10 mg by mouth daily.  . Omega-3 Fatty Acids (FISH OIL) 1000 MG CAPS Take 3 capsules by mouth daily.  Marland Kitchen oxymetazoline (AFRIN) 0.05 % nasal spray Place 1 spray into both nostrils 2 (two) times daily as needed for congestion.  . pravastatin (PRAVACHOL) 40 MG tablet TAKE 1 TABLET BY MOUTH  DAILY  . triamterene-hydrochlorothiazide (MAXZIDE-25) 37.5-25 MG tablet TAKE 1 TABLET BY MOUTH IN  THE MORNING   No facility-administered encounter medications on file as of 04/16/2020.    Current Diagnosis: Patient Active Problem List   Diagnosis Date Noted  . History of colonic polyps 11/05/2014  . Dyslipidemia 08/01/2006  . Depression, recurrent ([place]) 08/01/2006  . Essential hypertension 08/01/2006  . BPH (benign prostatic hyperplasia) 08/01/2006    Goals Addressed   None    Reviewed chart prior to disease state call. Spoke with patient regarding BP  Recent Office Vitals: BP Readings from Last 3 Encounters:  02/13/20 110/70  10/24/19 118/70  08/20/19 140/90   Pulse Readings from Last 3 Encounters:  02/13/20 (!) 59   10/24/19 72  08/20/19 68    Wt Readings from Last 3 Encounters:  02/13/20 184 lb 8 oz (83.7 kg)  10/24/19 189 lb 12.8 oz (86.1 kg)  08/20/19 188 lb (85.3 kg)     Kidney Function Lab Results  Component Value Date/Time   CREATININE 1.23 02/13/2020 07:42 AM   CREATININE 1.22 11/22/2018 10:26 AM   GFR 58.25 (L) 02/13/2020 07:42 AM   GFRNONAA 81 01/18/2008 08:23 AM   GFRAA 98 01/18/2008 08:23 AM    BMP Latest Ref Rng & Units 02/13/2020 11/22/2018 11/20/2017  Glucose 70 - 99 mg/dL 103(H) 95 95  BUN 6 - 23 mg/dL 22 19 17   Creatinine 0.40 - 1.50 mg/dL 1.23 1.22 1.14  Sodium 135 - 145 mEq/L 142 140 141  Potassium 3.5 - 5.1 mEq/L 3.6 3.8 3.9  Chloride 96 - 112 mEq/L 103 103 104  CO2 19 - 32 mEq/L 31 29 30   Calcium 8.4 - 10.5 mg/dL 9.7 10.1 9.4    . Current antihypertensive regimen:  o Amlodipine 10 mg: one tablet daily o Triamterene-HCTZ 37.5-25 mg: one tablet daily in the morning . How often are you checking your Blood Pressure? daily . Current home BP readings:  o 02/11 154/83  o 02/12 148/83 o 02/13 127/76 o 02/14 143/78 o 02/16 136/70 . What recent interventions/DTPs have been made by any provider to improve Blood Pressure control since last CPP Visit:  o The patient takes Amlodipine 10 mg at lunch instead of morning .  Any recent hospitalizations or ED visits since last visit with CPP? No . What diet changes have been made to improve Blood Pressure Control?  o Decreased sodium intake . What exercise is being done to improve your Blood Pressure Control?  o Increased walking  Adherence Review: Is the patient currently on ACE/ARB medication? No Does the patient have >5 day gap between last estimated fill dates? No  Follow-Up:  Pharmacist Review  Maia Breslow, [place] Assistant [phone]

## 2020-05-15 ENCOUNTER — Telehealth: Payer: Self-pay | Admitting: Internal Medicine

## 2020-05-15 DIAGNOSIS — E785 Hyperlipidemia, unspecified: Secondary | ICD-10-CM

## 2020-05-15 NOTE — Telephone Encounter (Signed)
Patient is calling and stated that the provider wanted him to start a new medication for cholesterol and needs it to be sent to [facility], [address], [address], [place] [address]  Phone:  [phone] Fax:  [phone] CB is [phone]

## 2020-05-19 ENCOUNTER — Telehealth: Payer: Self-pay | Admitting: Internal Medicine

## 2020-05-19 ENCOUNTER — Other Ambulatory Visit: Payer: Self-pay | Admitting: Internal Medicine

## 2020-05-19 DIAGNOSIS — E785 Hyperlipidemia, unspecified: Secondary | ICD-10-CM

## 2020-05-19 MED ORDER — PRAVASTATIN SODIUM 80 MG PO TABS: 80.0000 mg | ORAL_TABLET | Freq: Every day | ORAL | 1 refills | Status: DC

## 2020-05-19 NOTE — Telephone Encounter (Signed)
Called patient back and spoke with him about his cholesterol medication. He was doubling up on pravastatin 40 mg per our last discussion and ok by PCP and he has about 7 days left. He is looking for a new prescription to be sent to mail order pharmacy.  Will route to PCP to send in an updated prescription for him. He is aware to call back with further questions/concerns.

## 2020-05-19 NOTE — Telephone Encounter (Signed)
Pt call and want a call back at [phone] about his medication that you talk about and want to know was it changed by dr.Hernandez and sent to [facility] he stated he will be out soon.

## 2020-05-19 NOTE — Telephone Encounter (Signed)
Rx sent 

## 2020-05-19 NOTE — Telephone Encounter (Signed)
More info.

## 2020-05-19 NOTE — Telephone Encounter (Signed)
Thank you for clarifying. Rx sent.  Richard Sparks

## 2020-05-28 ENCOUNTER — Other Ambulatory Visit: Payer: Self-pay

## 2020-05-29 ENCOUNTER — Encounter: Payer: Self-pay | Admitting: Internal Medicine

## 2020-05-29 ENCOUNTER — Ambulatory Visit (INDEPENDENT_AMBULATORY_CARE_PROVIDER_SITE_OTHER): Payer: Medicare Other | Admitting: Internal Medicine

## 2020-05-29 VITALS — BP 130/80 | HR 88 | Temp 98.0°F | Wt 184.8 lb

## 2020-05-29 DIAGNOSIS — M7712 Lateral epicondylitis, left elbow: Secondary | ICD-10-CM | POA: Diagnosis not present

## 2020-05-29 DIAGNOSIS — K5900 Constipation, unspecified: Secondary | ICD-10-CM

## 2020-05-29 NOTE — Patient Instructions (Addendum)
-Nice seeing you today!!  -Start colace 100 mg twice daily and Miralax twice daily aim for 1 soft bowel movement a day.  -for your left elbow: brace, icing twice a day and anti-inflammatories as needed.    Tennis Elbow  Tennis elbow is irritation and swelling (inflammation) in your outer forearm, near your elbow. Swelling affects the tissues that connect muscle to bone (tendons). Tennis elbow can happen playing any sport or doing any job where you use your elbow too much. It is caused by doing the same motion over and over. What are the causes? This condition is often caused by playing sports or doing work where you need to keep moving your forearm the same way. Sometimes, it may be caused by a sudden injury. What increases the risk? You are more likely to get tennis elbow if you play tennis or another racket sport. You also have a higher risk if you often use your hands for work. This includes:  People who use computers.  Architect workers.  People who work in a factory.  Musicians.  Cooks.  Cashiers. What are the signs or symptoms?  Pain and tenderness in your forearm and the outer part of your elbow. You may have pain all the time or only when you use your arm.  A burning feeling. This starts in your elbow and spreads down your arm.  A weak grip in your hand. How is this treated? Resting and icing your arm is often the first treatment. Your doctor may also recommend:  Medicines to reduce pain and swelling.  An elbow strap.  Physical therapy. This may include massage or exercises or both.  An elbow brace. If these do not help your symptoms get better, your doctor may recommend surgery. Follow these instructions at home: If you have a brace or strap:  Wear the brace or strap as told by your doctor. Take it off only as told by your doctor.  Check the skin around the brace or strap every day. Tell your doctor if you see problems.  Loosen it if your  fingers: ? Tingle. ? Become numb. ? Turn cold and blue.  Keep the brace or strap clean.  If the brace or strap is not waterproof: ? Do not let it get wet. ? Cover it with a watertight covering when you take a bath or a shower. Managing pain, stiffness, and swelling  If told, put ice on the injured area. To do this: ? If you have a removable brace or strap, take it off as told by your doctor. ? Put ice in a plastic bag. ? Place a towel between your skin and the bag. ? Leave the ice on for 20 minutes, 2-3 times a day. ? Take off the ice if your skin turns bright red. This is very important. If you cannot feel pain, heat, or cold, you have a greater risk of damage to the area.  Move your fingers often.   Activity  Rest your elbow and wrist. Avoid activities that can cause elbow problems as told by your doctor.  Do exercises as told by your doctor.  If you lift an object, lift it with your palm facing up. Lifestyle  If your tennis elbow is caused by sports, check your equipment and make sure that: ? You are using it the right way. ? It fits you well.  If your tennis elbow is caused by work or computer use, take breaks often to stretch your arm. Talk  with your manager about how you can make your condition better at work. General instructions  Take over-the-counter and prescription medicines only as told by your doctor.  Do not smoke or use any products that contain nicotine or tobacco. If you need help quitting, ask your doctor.  Keep all follow-up visits. How is this prevented?  Before and after being active: ? Warm up and stretch before being active. ? Cool down and stretch after being active. ? Give your body time to rest between activities.  While being active: ? Make sure to use equipment that fits you. ? If you play tennis, put power in your stroke with your lower body. Avoid using your arm only.  Maintain physical fitness. This  includes: ? Strength. ? Flexibility. ? Endurance.  Do exercises to strengthen the forearm muscles. Contact a doctor if:  Your pain does not get better with treatment.  Your pain gets worse.  You have weakness in your forearm, hand, or fingers.  You cannot feel your forearm, hand, or fingers. Get help right away if:  Your pain is very bad.  You cannot move your wrist. Summary  Tennis elbow is irritation and swelling (inflammation) in your outer forearm, near your elbow.  Tennis elbow is caused by doing the same motion over and over.  Rest your elbow and wrist. Avoid activities as told by your doctor.  If told, put ice on the injured area for 20 minutes, 2-3 times a day. This information is not intended to replace advice given to you by your health care provider. Make sure you discuss any questions you have with your health care provider. Document Revised: 09/03/2019 Document Reviewed: 09/03/2019 Elsevier Patient Education  [place].

## 2020-05-29 NOTE — Progress Notes (Signed)
Acute office Visit     This visit occurred during the SARS-CoV-2 public health emergency.  Safety protocols were in place, including screening questions prior to the visit, additional usage of staff PPE, and extensive cleaning of exam room while observing appropriate contact time as indicated for disinfecting solutions.    CC/Reason for Visit: Discuss acute concerns  HPI: Richard Sparks is a 75 y.o. male who is coming in today for the above mentioned reasons.  He is here today to discuss 2 main issues:  1.  He has some bowel concerns.  He feels that even though he gets the urge to defecate frequently he rarely has bowel movements or they are not what he would consider a full bowel movement.  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  He had a colonoscopy in 2020.  2.  He has been having left elbow pain for some time.  He has been using NSAIDs occasionally which seem to help.  Pain is located at the lateral epicondyle.  No injury or trauma that he can recall.  Past Medical/Surgical History: Past Medical History:  Diagnosis Date  . Allergy   . BENIGN PROSTATIC HYPERTROPHY 08/01/2006  . DEPRESSION 08/01/2006  . HYPERLIPIDEMIA 08/01/2006  . HYPERTENSION 08/01/2006    Past Surgical History:  Procedure Laterality Date  . COLONOSCOPY  last 12/31/2013  . NO PAST SURGERIES      Social History:  reports that he has never smoked. He has never used smokeless tobacco. He reports that he does not drink alcohol and does not use drugs.  Allergies: Allergies  Allergen Reactions  . Amoxicillin Rash    Family History:  Family History  Problem Relation Age of Onset  . Colon cancer Neg Hx   . Esophageal cancer Neg Hx   . Rectal cancer Neg Hx   . Stomach cancer Neg Hx   . Colon polyps Neg Hx      Current Outpatient Medications:  .  amLODipine (NORVASC) 10 MG tablet, TAKE 1 TABLET BY MOUTH  DAILY, Disp:  90 tablet, Rfl: 1 .  b complex vitamins tablet, Take 1 tablet by mouth daily., Disp: , Rfl:  .  buPROPion (WELLBUTRIN XL) 150 MG 24 hr tablet, TAKE 1 TABLET BY MOUTH  DAILY, Disp: 90 tablet, Rfl: 3 .  Cholecalciferol (VITAMIN D) 1000 UNITS capsule, Take 1,000 Units by mouth daily., Disp: , Rfl:  .  fluticasone (FLONASE) 50 MCG/ACT nasal spray, Place 1 spray into both nostrils daily., Disp: 16 g, Rfl: 5 .  loratadine (CLARITIN) 10 MG tablet, Take 10 mg by mouth daily., Disp: , Rfl:  .  Omega-3 Fatty Acids (FISH OIL) 1000 MG CAPS, Take 3 capsules by mouth daily., Disp: , Rfl:  .  oxymetazoline (AFRIN) 0.05 % nasal spray, Place 1 spray into both nostrils 2 (two) times daily as needed for congestion., Disp: , Rfl:  .  pravastatin (PRAVACHOL) 80 MG tablet, Take 1 tablet (80 mg total) by mouth daily., Disp: 90 tablet, Rfl: 1 .  temazepam (RESTORIL) 15 MG capsule, TAKE 1 CAPSULE (15 MG TOTAL) BY MOUTH AT BEDTIME AS NEEDED. FOR SLEEP, Disp: 90 capsule, Rfl: 0 .  triamterene-hydrochlorothiazide (MAXZIDE-25) 37.5-25 MG tablet, TAKE 1 TABLET BY MOUTH IN  THE MORNING, Disp: 90 tablet, Rfl: 3  Review of Systems:  Constitutional: Denies fever, chills, diaphoresis, appetite change and fatigue.  HEENT: Denies photophobia, eye pain,  redness, hearing loss, ear pain, congestion, sore throat, rhinorrhea, sneezing, mouth sores, trouble swallowing, neck pain, neck stiffness and tinnitus.   Respiratory: Denies SOB, DOE, cough, chest tightness,  and wheezing.   Cardiovascular: Denies chest pain, palpitations and leg swelling.  Gastrointestinal: Denies nausea, vomiting, abdominal pain, blood in stool and abdominal distention.  Genitourinary: Denies dysuria, urgency, frequency, hematuria, flank pain and difficulty urinating.  Endocrine: Denies: hot or cold intolerance, sweats, changes in hair or nails, polyuria, polydipsia. Musculoskeletal: Denies myalgias, back pain, and gait problem.  Skin: Denies pallor, rash and  wound.  Neurological: Denies dizziness, seizures, syncope, weakness, light-headedness, numbness and headaches.  Hematological: Denies adenopathy. Easy bruising, personal or family bleeding history  Psychiatric/Behavioral: Denies suicidal ideation, mood changes, confusion, nervousness, sleep disturbance and agitation    Physical Exam: Vitals:   05/29/20 0843  BP: 130/80  Pulse: 88  Temp: 98 F (36.7 C)  TempSrc: Oral  SpO2: 96%  Weight: 184 lb 12.8 oz (83.8 kg)    Body mass index is 26.14 kg/m.   Constitutional: NAD, calm, comfortable Eyes: PERRL, lids and conjunctivae normal ENMT: Mucous membranes are moist.  Musculoskeletal: Slight pain to palpation of the lateral epicondyle of the left elbow, no edema or erythema identified. Neurologic: Grossly intact and nonfocal Psychiatric: Normal judgment and insight. Alert and oriented x 3. Normal mood.    Impression and Plan:  Left lateral epicondylitis -For now have advised icing, and elbow brace, as needed NSAIDs. -If pain continues can consider sports medicine/Ortho referral.  Constipation, unspecified constipation type -I suspect he is probably constipated and has loose stool leaking around it. -We will do Colace twice daily and MiraLAX twice to once daily for the next week or so to see if this helps him have regular bowel movements again.  He will contact us with continued issues.    Patient Instructions   -Nice seeing you today!!  -Start colace 100 mg twice daily and Miralax twice daily aim for 1 soft bowel movement a day.  -for your left elbow: brace, icing twice a day and anti-inflammatories as needed.    Tennis Elbow  Tennis elbow is irritation and swelling (inflammation) in your outer forearm, near your elbow. Swelling affects the tissues that connect muscle to bone (tendons). Tennis elbow can happen playing any sport or doing any job where you use your elbow too much. It is caused by doing the same motion over  and over. What are the causes? This condition is often caused by playing sports or doing work where you need to keep moving your forearm the same way. Sometimes, it may be caused by a sudden injury. What increases the risk? You are more likely to get tennis elbow if you play tennis or another racket sport. You also have a higher risk if you often use your hands for work. This includes:  People who use computers.  Architect workers.  People who work in a factory.  Musicians.  Cooks.  Cashiers. What are the signs or symptoms?  Pain and tenderness in your forearm and the outer part of your elbow. You may have pain all the time or only when you use your arm.  A burning feeling. This starts in your elbow and spreads down your arm.  A weak grip in your hand. How is this treated? Resting and icing your arm is often the first treatment. Your doctor may also recommend:  Medicines to reduce pain and swelling.  An elbow strap.  Physical therapy. This  may include massage or exercises or both.  An elbow brace. If these do not help your symptoms get better, your doctor may recommend surgery. Follow these instructions at home: If you have a brace or strap:  Wear the brace or strap as told by your doctor. Take it off only as told by your doctor.  Check the skin around the brace or strap every day. Tell your doctor if you see problems.  Loosen it if your fingers: ? Tingle. ? Become numb. ? Turn cold and blue.  Keep the brace or strap clean.  If the brace or strap is not waterproof: ? Do not let it get wet. ? Cover it with a watertight covering when you take a bath or a shower. Managing pain, stiffness, and swelling  If told, put ice on the injured area. To do this: ? If you have a removable brace or strap, take it off as told by your doctor. ? Put ice in a plastic bag. ? Place a towel between your skin and the bag. ? Leave the ice on for 20 minutes, 2-3 times a  day. ? Take off the ice if your skin turns bright red. This is very important. If you cannot feel pain, heat, or cold, you have a greater risk of damage to the area.  Move your fingers often.   Activity  Rest your elbow and wrist. Avoid activities that can cause elbow problems as told by your doctor.  Do exercises as told by your doctor.  If you lift an object, lift it with your palm facing up. Lifestyle  If your tennis elbow is caused by sports, check your equipment and make sure that: ? You are using it the right way. ? It fits you well.  If your tennis elbow is caused by work or computer use, take breaks often to stretch your arm. Talk with your manager about how you can make your condition better at work. General instructions  Take over-the-counter and prescription medicines only as told by your doctor.  Do not smoke or use any products that contain nicotine or tobacco. If you need help quitting, ask your doctor.  Keep all follow-up visits. How is this prevented?  Before and after being active: ? Warm up and stretch before being active. ? Cool down and stretch after being active. ? Give your body time to rest between activities.  While being active: ? Make sure to use equipment that fits you. ? If you play tennis, put power in your stroke with your lower body. Avoid using your arm only.  Maintain physical fitness. This includes: ? Strength. ? Flexibility. ? Endurance.  Do exercises to strengthen the forearm muscles. Contact a doctor if:  Your pain does not get better with treatment.  Your pain gets worse.  You have weakness in your forearm, hand, or fingers.  You cannot feel your forearm, hand, or fingers. Get help right away if:  Your pain is very bad.  You cannot move your wrist. Summary  Tennis elbow is irritation and swelling (inflammation) in your outer forearm, near your elbow.  Tennis elbow is caused by doing the same motion over and  over.  Rest your elbow and wrist. Avoid activities as told by your doctor.  If told, put ice on the injured area for 20 minutes, 2-3 times a day. This information is not intended to replace advice given to you by your health care provider. Make sure you discuss any questions you have  with your health care provider. Document Revised: 09/03/2019 Document Reviewed: 09/03/2019 Elsevier Patient Education  2021 [place], MD [facility]

## 2020-06-08 ENCOUNTER — Telehealth: Payer: Self-pay | Admitting: Pharmacist

## 2020-06-08 NOTE — Telephone Encounter (Signed)
Patient called as he is still experiencing constipation but has only been taking Miralax once a day and colace twice daily.  Recommended for him to try taking Miralax twice a day as well for a few days and see if he is able to get any additional relief. Patient is aware to call us in a few days if he does not find relief from twice daily Miralax.  Encouraged eating plenty of fiber and drinking fluids as well.

## 2020-07-21 ENCOUNTER — Telehealth: Payer: Self-pay | Admitting: Pharmacist

## 2020-07-21 NOTE — Chronic Care Management (AMB) (Signed)
I spoke with the patient about his upcoming appointment on 07/22/2020 @ 11:00 am with the clinical pharmacist. He was asked to please have all medication on hand to review with the pharmacist. He confirmed the appointment.   Maia Breslow, [place] [phone]

## 2020-07-22 ENCOUNTER — Ambulatory Visit (INDEPENDENT_AMBULATORY_CARE_PROVIDER_SITE_OTHER): Payer: Medicare Other | Admitting: Pharmacist

## 2020-07-22 DIAGNOSIS — E785 Hyperlipidemia, unspecified: Secondary | ICD-10-CM | POA: Diagnosis not present

## 2020-07-22 DIAGNOSIS — I10 Essential (primary) hypertension: Secondary | ICD-10-CM | POA: Diagnosis not present

## 2020-07-22 NOTE — Progress Notes (Signed)
Chronic Care Management Pharmacy Note  08/04/2020 Name:  Richard Sparks MRN:  [id number] DOB:  [birth year]  Subjective: Richard Sparks is an 75 y.o. year old male who is a primary patient of [name], [name], MD.  The CCM team was consulted for assistance with disease management and care coordination needs.    Engaged with patient by telephone for follow up visit in response to provider referral for pharmacy case management and/or care coordination services.   Consent to Services:  The patient was given information about Chronic Care Management services, agreed to services, and gave verbal consent prior to initiation of services.  Please see initial visit note for detailed documentation.   Patient Care Team: [name], [name], MD as PCP - General (Internal Medicine) [name], [facility] as Pharmacist (Pharmacist)  Recent office visits: 05/29/20 Domingo Mend, MD: Patient presented for GI concerns and elbow pain. Recommended colace 100 mg BID and Miralax BID and a brace for his elbow and icing twice a day.  02/13/20 Estela [name], MD: Patient presented for annual exam. Follow up in 6 months.   Recent consult visits: 02/08/2019 Sundra Aland, RN (endoscopy center): Patient presented for colonoscopy prep due to personal history of colonic polyps.  12/10/2018 Julien Girt (optometry): Patient presented for eye exam. Unable to access notes.  Hospital visits: None in previous 6 months  Objective:  Lab Results  Component Value Date   CREATININE 1.23 02/13/2020   BUN 22 02/13/2020   GFR 58.25 (L) 02/13/2020   GFRNONAA 81 01/18/2008   GFRAA 98 01/18/2008   NA 142 02/13/2020   K 3.6 02/13/2020   CALCIUM 9.7 02/13/2020   CO2 31 02/13/2020   GLUCOSE 103 (H) 02/13/2020    Lab Results  Component Value Date/Time   HGBA1C 5.6 02/13/2020 07:42 AM   HGBA1C 5.6 11/22/2018 10:26 AM   GFR 58.25 (L) 02/13/2020 07:42 AM   GFR 58.35 (L) 11/22/2018 10:26 AM     Last diabetic Eye exam: No results found for: HMDIABEYEEXA  Last diabetic Foot exam: No results found for: HMDIABFOOTEX   Lab Results  Component Value Date   CHOL 197 02/13/2020   HDL 42.40 02/13/2020   LDLCALC 96 10/10/2013   LDLDIRECT 127.0 02/13/2020   TRIG 253.0 (H) 02/13/2020   CHOLHDL 5 02/13/2020    Hepatic Function Latest Ref Rng & Units 02/13/2020 11/22/2018 11/20/2017  Total Protein 6.0 - 8.3 g/dL 6.7 6.7 6.3  Albumin 3.5 - 5.2 g/dL 4.4 4.4 4.1  AST 0 - 37 U/L _0 ALT 0 - 53 U/L _1 Alk Phosphatase 39 - 117 U/L 56 59 57  Total Bilirubin 0.2 - 1.2 mg/dL 0.9 0.9 0.8  Bilirubin, Direct 0.0 - 0.3 mg/dL - - -    Lab Results  Component Value Date/Time   TSH 1.91 02/13/2020 07:42 AM   TSH 1.49 11/22/2018 10:26 AM    CBC Latest Ref Rng & Units 02/13/2020 11/22/2018 11/20/2017  WBC 4.0 - 10.5 K/uL 7.5 7.6 7.2  Hemoglobin 13.0 - 17.0 g/dL 15.9 17.3(H) 15.9  Hematocrit 39.0 - 52.0 % 46.3 49.1 45.5  Platelets 150.0 - 400.0 K/uL 237.0 234.0 235.0    Lab Results  Component Value Date/Time   VD25OH 44.50 02/13/2020 07:42 AM   VD25OH 78.03 11/22/2018 10:26 AM    Clinical ASCVD: No  The 10-year ASCVD risk score Mikey Bussing DC Jr., et al., 2013) is: 27.2%   Values used to calculate  the score:     Age: 74 years     Sex: Male     Is Non-Hispanic African American: No     Diabetic: No     Tobacco smoker: No     Systolic Blood Pressure: 130 mmHg     Is BP treated: Yes     HDL Cholesterol: 42.4 mg/dL     Total Cholesterol: 197 mg/dL    Depression screen PHQ 2/9 05/29/2020 02/13/2020 08/20/2019  Decreased Interest 1 1 0  Down, Depressed, Hopeless 3 1 0  PHQ - 2 Score 4 2 0  Altered sleeping 2 2 1  Tired, decreased energy 1 1 1  Change in appetite 0 0 0  Feeling bad or failure about yourself  1 0 0  Trouble concentrating 1 0 0  Moving slowly or fidgety/restless 0 0 0  Suicidal thoughts 0 0 0  PHQ-9 Score 9 5 2  Difficult doing work/chores Not difficult at all Not  difficult at all Not difficult at all      Social History   Tobacco Use  Smoking Status Never Smoker  Smokeless Tobacco Never Used   BP Readings from Last 3 Encounters:  05/29/20 130/80  02/13/20 110/70  10/24/19 118/70   Pulse Readings from Last 3 Encounters:  05/29/20 88  02/13/20 (!) 59  10/24/19 72   Wt Readings from Last 3 Encounters:  05/29/20 184 lb 12.8 oz (83.8 kg)  02/13/20 184 lb 8 oz (83.7 kg)  10/24/19 189 lb 12.8 oz (86.1 kg)   BMI Readings from Last 3 Encounters:  05/29/20 26.14 kg/m  02/13/20 26.10 kg/m  10/24/19 26.47 kg/m    Assessment/Interventions: Review of patient past medical history, allergies, medications, health status, including review of consultants reports, laboratory and other test data, was performed as part of comprehensive evaluation and provision of chronic care management services.   SDOH:  (Social Determinants of Health) assessments and interventions performed: No  SDOH Screenings   Alcohol Screen: Not on file  Depression (PHQ2-9): Medium Risk  . PHQ-2 Score: 9  Financial Resource Strain: Not on file  Food Insecurity: Not on file  Housing: Not on file  Physical Activity: Not on file  Social Connections: Not on file  Stress: Not on file  Tobacco Use: Low Risk   . Smoking Tobacco Use: Never Smoker  . Smokeless Tobacco Use: Never Used  Transportation Needs: Not on file   Patient has been having GI issues still. He took Miralax and Dulcolax for a couple of weeks and stopped it since. He has been taking metamucil with breakfast. Recommended nutritionist/dietician referral.   CCM Care Plan  Allergies  Allergen Reactions  . Amoxicillin Rash    Medications Reviewed Today    Reviewed by Hernandez Acosta, Estela Y, MD (Physician) on 05/29/20 at 0907  Med List Status: <None>  Medication Order Taking? Sig Documenting Provider Last Dose Status Informant  amLODipine (NORVASC) 10 MG tablet [id number] Yes TAKE 1 TABLET BY MOUTH   DAILY Hernandez Acosta, Estela Y, MD Taking Active   b complex vitamins tablet [id number] Yes Take 1 tablet by mouth daily. [provider] Taking Active   buPROPion (WELLBUTRIN XL) 150 MG 24 hr tablet [id number] Yes TAKE 1 TABLET BY MOUTH  DAILY Hernandez Acosta, Estela Y, MD Taking Active   Cholecalciferol (VITAMIN D) 1000 UNITS capsule [id number] Yes Take 1,000 Units by mouth daily. [provider] Taking Active   fluticasone (FLONASE) 50 MCG/ACT nasal spray [id number] Yes Place 1   spray into both nostrils daily. Kwiatkowski, Peter F, MD Taking Active   loratadine (CLARITIN) 10 MG tablet [id number] Yes Take 10 mg by mouth daily. [provider] Taking Active   Omega-3 Fatty Acids (FISH OIL) 1000 MG CAPS [id number] Yes Take 3 capsules by mouth daily. [provider] Taking Active   oxymetazoline (AFRIN) 0.05 % nasal spray [id number] Yes Place 1 spray into both nostrils 2 (two) times daily as needed for congestion. [provider] Taking Active   pravastatin (PRAVACHOL) 80 MG tablet [id number] Yes Take 1 tablet (80 mg total) by mouth daily. Hernandez Acosta, Estela Y, MD Taking Active   temazepam (RESTORIL) 15 MG capsule [id number] Yes TAKE 1 CAPSULE (15 MG TOTAL) BY MOUTH AT BEDTIME AS NEEDED. FOR SLEEP Hernandez Acosta, Estela Y, MD Taking Active   triamterene-hydrochlorothiazide (MAXZIDE-25) 37.5-25 MG tablet [id number] Yes TAKE 1 TABLET BY MOUTH IN  THE MORNING Hernandez Acosta, Estela Y, MD Taking Active           Patient Active Problem List   Diagnosis Date Noted  . History of colonic polyps 11/05/2014  . Dyslipidemia 08/01/2006  . Depression, recurrent (HCC) 08/01/2006  . Essential hypertension 08/01/2006  . BPH (benign prostatic hyperplasia) 08/01/2006    Immunization History  Administered Date(s) Administered  . Influenza, High Dose Seasonal PF 01/06/2020  . Influenza,inj,Quad PF,6+ Mos 11/05/2014  . Janssen (J&J) SARS-COV-2 Vaccination 06/14/2019   . Pneumococcal Conjugate-13 04/26/2013  . Pneumococcal Polysaccharide-23 10/25/2011  . Tdap 09/07/2010  . Zoster Recombinat (Shingrix) 02/07/2020, 05/12/2020  . Zoster, Live 10/25/2012    Conditions to be addressed/monitored:  Hypertension, Hyperlipidemia, Depression, BPH, Allergic Rhinitis and Insomnia  Care Plan : CCM Pharmacy Care Plan  Updates made by Pryor, Madeline G, RPH since 08/04/2020 12:00 AM    Problem: Problem: Hypertension, Hyperlipidemia, Depression, BPH, Allergic Rhinitis and Insomnia     Long-Range Goal: Patient-Specific Goal   Start Date: 07/22/2020  Expected End Date: 07/22/2021  This Visit's Progress: On track  Priority: High  Note:   Current Barriers:  . Unable to independently monitor therapeutic efficacy . Unable to achieve control of cholesterol   Pharmacist Clinical Goal(s):  . Patient will achieve adherence to monitoring guidelines and medication adherence to achieve therapeutic efficacy . achieve control of cholesterol as evidenced by next lipid panel  through collaboration with PharmD and provider.   Interventions: . 1:1 collaboration with Hernandez Acosta, Estela Y, MD regarding development and update of comprehensive plan of care as evidenced by provider attestation and co-signature . Inter-disciplinary care team collaboration (see longitudinal plan of care) . Comprehensive medication review performed; medication list updated in electronic medical record  Hypertension (BP goal <140/90) -Controlled -Current treatment:  Amlodipine 10 mg 1 tablet daily - taking it at lunch  Triamterene-HCTZ 37.5-25 mg 1 tablet daily in the morning -Medications previously tried: none -Current home readings: not checking regularly -Current dietary habits: leaves salt out of recipes and uses sea salt -Current exercise habits: walking 30-45 minutes with dogs -Denies hypotensive/hypertensive symptoms -Educated on BP goals and benefits of medications for prevention of  heart attack, stroke and kidney damage; Exercise goal of 150 minutes per week; Importance of home blood pressure monitoring; Proper BP monitoring technique; -Counseled to monitor BP at home weekly, document, and provide log at future appointments -Counseled on diet and exercise extensively Recommended to continue current medication Recommended setting a weekly alarm to remember to check blood pressure  Hyperlipidemia: (LDL goal < 100) -Uncontrolled -Current treatment:  Pravastatin 80   mg 1 tablet daily  Omega 3 fatty acids 1000 mg 3 capsules daily -Medications previously tried: Lopid (gemfibrozil), atorvastatin (cost-brand) -Current dietary patterns: did not discuss -Current exercise habits: walking 30-45 minutes with dogs -Educated on Cholesterol goals;  Benefits of statin for ASCVD risk reduction; Exercise goal of 150 minutes per week; -Counseled on diet and exercise extensively Recommended to continue current medication Recommended repeat lipid panel  Depression(Goal: minimize symptoms) -Controlled -Current treatment: . Bupropion XL 150 mg 1 tablet daily -Medications previously tried/failed: none -PHQ9: 9 -Educated on Benefits of medication for symptom control Benefits of cognitive-behavioral therapy with or without medication -Recommended to continue current medication  Insomnia (Goal: improve quality and quantity of sleep) -Uncontrolled -Current treatment   Temazepam 15 mg 1 tablet at bedtime PRN sleep -Medications previously tried: Rozerem (ineffective), Sonata (unknown) -Recommended trial period of taking melatonin when he wakes up in the night to see if this helps but recommended taking a low dose -Recommended avoiding Benadryl due to risk of falls  Allergic rhinitis (Goal: minimize symptoms) -Controlled -Current treatment   Claritin 10 mg 1 tablet daily  Flonase 50 mcg/act 1 spray in both nostrils daily  Afrin 0.05% 1 spray in both nostrils twice daily as  needed -Medications previously tried: none  -Counseled on limiting Afrin use to caution with rebound congestion and increasing BP; avoiding allergy triggers   Health Maintenance -Vaccine gaps: COVID booster -Current therapy:   Vitamin B complex 1 tablet daily  Vitamin D 1000 units 1 capsule daily -Educated on Cost vs benefit of each product must be carefully weighed by individual consumer -Patient is satisfied with current therapy and denies issues -Recommended to continue current medication  Patient Goals/Self-Care Activities . Patient will:  - take medications as prescribed check blood pressure weekly, document, and provide at future appointments target a minimum of 150 minutes of moderate intensity exercise weekly  Follow Up Plan: Telephone follow up appointment with care management team member scheduled for: 6 months       Medication Assistance: None required.  Patient affirms current coverage meets needs.  Patient's preferred pharmacy is:  [facility], [place] [address] [place] [address] Phone: [phone] Fax: [phone]  [place], [address], [address] [place], [place] [address] Phone: [phone] Fax: [phone]  [facility], [place] [address] [place] [address] Phone: [phone] Fax: [phone]  Uses pill box? No - puts medicines on a plastic lid in 3 batches; uses a pillbox when he is not going to be home for lunch Pt endorses 100% compliance  We discussed: Current pharmacy is preferred with insurance plan and patient is satisfied with pharmacy services Patient decided to: Continue current medication management strategy  Care Plan and Follow Up Patient Decision:  Patient agrees to Care Plan and Follow-up.  Plan: Telephone follow up appointment with care management team member  scheduled for:  6 months  Jeni Salles, PharmD [place]  at [place] [phone]   Follow up 6 months -

## 2020-08-04 NOTE — Patient Instructions (Signed)
Hi [place],  It was great catching up again! Below is a summary of some of the topics we discussed.   Please reach out to me if you have any questions or need anything before our follow up!  Best, Maddie  Jeni Salles, PharmD, [place] [facility] [place]  Visit Information  Goals Addressed   None    Patient Care Plan: CCM Pharmacy Care Plan    Problem Identified: Problem: Hypertension, Hyperlipidemia, Depression, BPH, Allergic Rhinitis and Insomnia     Long-Range Goal: Patient-Specific Goal   Start Date: 07/22/2020  Expected End Date: 07/22/2021  This Visit's Progress: On track  Priority: High  Note:   Current Barriers:  . Unable to independently monitor therapeutic efficacy . Unable to achieve control of cholesterol   Pharmacist Clinical Goal(s):  Marland Kitchen Patient will achieve adherence to monitoring guidelines and medication adherence to achieve therapeutic efficacy . achieve control of cholesterol as evidenced by next lipid panel  through collaboration with PharmD and provider.   Interventions: . 1:1 collaboration with [name], [name], MD regarding development and update of comprehensive plan of care as evidenced by provider attestation and co-signature . Inter-disciplinary care team collaboration (see longitudinal plan of care) . Comprehensive medication review performed; medication list updated in electronic medical record  Hypertension (BP goal <140/90) -Controlled -Current treatment:  Amlodipine 10 mg 1 tablet daily - taking it at lunch  Triamterene-HCTZ 37.5-25 mg 1 tablet daily in the morning -Medications previously tried: none -Current home readings: not checking regularly -Current dietary habits: leaves salt out of recipes and uses sea salt -Current exercise habits: walking 30-45 minutes with dogs -Denies hypotensive/hypertensive symptoms -Educated on BP goals and benefits of medications for prevention of  heart attack, stroke and kidney damage; Exercise goal of 150 minutes per week; Importance of home blood pressure monitoring; Proper BP monitoring technique; -Counseled to monitor BP at home weekly, document, and provide log at future appointments -Counseled on diet and exercise extensively Recommended to continue current medication Recommended setting a weekly alarm to remember to check blood pressure  Hyperlipidemia: (LDL goal < 100) -Uncontrolled -Current treatment:  Pravastatin 80 mg 1 tablet daily  Omega 3 fatty acids 1000 mg 3 capsules daily -Medications previously tried: Lopid (gemfibrozil), atorvastatin (cost-brand) -Current dietary patterns: did not discuss -Current exercise habits: walking 30-45 minutes with dogs -Educated on Cholesterol goals;  Benefits of statin for ASCVD risk reduction; Exercise goal of 150 minutes per week; -Counseled on diet and exercise extensively Recommended to continue current medication Recommended repeat lipid panel  Depression(Goal: minimize symptoms) -Controlled -Current treatment: . Bupropion XL 150 mg 1 tablet daily -Medications previously tried/failed: none -PHQ9: 9 -Educated on Benefits of medication for symptom control Benefits of cognitive-behavioral therapy with or without medication -Recommended to continue current medication  Insomnia (Goal: improve quality and quantity of sleep) -Uncontrolled -Current treatment   Temazepam 15 mg 1 tablet at bedtime PRN sleep -Medications previously tried: Rozerem (ineffective), Sonata (unknown) -Recommended trial period of taking melatonin when he wakes up in the night to see if this helps but recommended taking a low dose -Recommended avoiding Benadryl due to risk of falls  Allergic rhinitis (Goal: minimize symptoms) -Controlled -Current treatment   Claritin 10 mg 1 tablet daily  Flonase 50 mcg/act 1 spray in both nostrils daily  Afrin 0.05% 1 spray in both nostrils twice daily as  needed -Medications previously tried: none  -Counseled on limiting Afrin use to caution with rebound congestion and increasing  BP; avoiding allergy triggers   Health Maintenance -Vaccine gaps: COVID booster -Current therapy:   Vitamin B complex 1 tablet daily  Vitamin D 1000 units 1 capsule daily -Educated on Cost vs benefit of each product must be carefully weighed by individual consumer -Patient is satisfied with current therapy and denies issues -Recommended to continue current medication  Patient Goals/Self-Care Activities . Patient will:  - take medications as prescribed check blood pressure weekly, document, and provide at future appointments target a minimum of 150 minutes of moderate intensity exercise weekly  Follow Up Plan: Telephone follow up appointment with care management team member scheduled for: 6 months       Patient verbalizes understanding of instructions provided today and agrees to view in [place].  Telephone follow up appointment with pharmacy team member scheduled for:6 months  [name], [facility]

## 2020-08-05 ENCOUNTER — Telehealth: Payer: Self-pay | Admitting: *Deleted

## 2020-08-05 DIAGNOSIS — R635 Abnormal weight gain: Secondary | ICD-10-CM

## 2020-08-05 NOTE — Telephone Encounter (Signed)
-----   Message from [name], [facility] sent at 08/04/2020  1:21 PM EDT ----- Regarding: Nutritionist/dieticin referral Hi,  I wasn't sure who to send this along to but Mr. Churchwell is still having GI issues and was wondering if he could have a referral for a nutritionist/dietician to see if they can help him figure out if there is anything in his diet that is contributing.  Thanks, Maddie

## 2020-08-12 ENCOUNTER — Other Ambulatory Visit: Payer: Self-pay

## 2020-08-13 ENCOUNTER — Encounter: Payer: Self-pay | Admitting: Internal Medicine

## 2020-08-13 ENCOUNTER — Ambulatory Visit (INDEPENDENT_AMBULATORY_CARE_PROVIDER_SITE_OTHER): Payer: Medicare Other | Admitting: Internal Medicine

## 2020-08-13 VITALS — BP 130/80 | HR 65 | Temp 97.8°F | Wt 184.3 lb

## 2020-08-13 DIAGNOSIS — E785 Hyperlipidemia, unspecified: Secondary | ICD-10-CM

## 2020-08-13 DIAGNOSIS — I10 Essential (primary) hypertension: Secondary | ICD-10-CM | POA: Diagnosis not present

## 2020-08-13 DIAGNOSIS — K5900 Constipation, unspecified: Secondary | ICD-10-CM

## 2020-08-13 LAB — LIPID PANEL
Cholesterol: 178 mg/dL (ref 0–200)
HDL: 43.6 mg/dL (ref >39.00)
NonHDL: 134.11
Total CHOL/HDL Ratio: 4
Triglycerides: 213 mg/dL — ABNORMAL HIGH (ref 0.0–149.0)
VLDL: 42.6 mg/dL — ABNORMAL HIGH (ref 0.0–40.0)

## 2020-08-13 LAB — LDL CHOLESTEROL, DIRECT: Direct LDL: 112 mg/dL

## 2020-08-13 NOTE — Patient Instructions (Signed)
-  Nice seeing you today!!  -Lab work today; will notify you once results are available.

## 2020-08-13 NOTE — Progress Notes (Signed)
Established Patient Office Visit     This visit occurred during the SARS-CoV-2 public health emergency.  Safety protocols were in place, including screening questions prior to the visit, additional usage of staff PPE, and extensive cleaning of exam room while observing appropriate contact time as indicated for disinfecting solutions.    CC/Reason for Visit: Follow-up chronic conditions  HPI: Richard Sparks is a 75 y.o. male who is coming in today for the above mentioned reasons. Past Medical History is significant for: Hypertension, hyperlipidemia, depression, insomnia, chronic constipation.  He is due for lipid panel today to recheck values after his pravastatin was increased to 80 mg 6 months ago.  He also takes omega-3 fatty acids.  He stopped taking his MiraLAX and Colace.  He still will have days where he does not have a bowel movement and feels bloated.   Past Medical/Surgical History: Past Medical History:  Diagnosis Date   Allergy    BENIGN PROSTATIC HYPERTROPHY 08/01/2006   DEPRESSION 08/01/2006   HYPERLIPIDEMIA 08/01/2006   HYPERTENSION 08/01/2006    Past Surgical History:  Procedure Laterality Date   COLONOSCOPY  last 12/31/2013   NO PAST SURGERIES      Social History:  reports that he has never smoked. He has never used smokeless tobacco. He reports that he does not drink alcohol and does not use drugs.  Allergies: Allergies  Allergen Reactions   Amoxicillin Rash    Family History:  Family History  Problem Relation Age of Onset   Colon cancer Neg Hx    Esophageal cancer Neg Hx    Rectal cancer Neg Hx    Stomach cancer Neg Hx    Colon polyps Neg Hx      Current Outpatient Medications:    amLODipine (NORVASC) 10 MG tablet, TAKE 1 TABLET BY MOUTH  DAILY, Disp: 90 tablet, Rfl: 1   b complex vitamins tablet, Take 1 tablet by mouth daily., Disp: , Rfl:    buPROPion (WELLBUTRIN XL) 150 MG 24 hr tablet, TAKE 1 TABLET BY MOUTH  DAILY, Disp: 90 tablet, Rfl:  3   Cholecalciferol (VITAMIN D) 1000 UNITS capsule, Take 1,000 Units by mouth daily., Disp: , Rfl:    fluticasone (FLONASE) 50 MCG/ACT nasal spray, Place 1 spray into both nostrils daily., Disp: 16 g, Rfl: 5   loratadine (CLARITIN) 10 MG tablet, Take 10 mg by mouth daily., Disp: , Rfl:    Omega-3 Fatty Acids (FISH OIL) 1000 MG CAPS, Take 3 capsules by mouth daily., Disp: , Rfl:    oxymetazoline (AFRIN) 0.05 % nasal spray, Place 1 spray into both nostrils 2 (two) times daily as needed for congestion., Disp: , Rfl:    pravastatin (PRAVACHOL) 80 MG tablet, Take 1 tablet (80 mg total) by mouth daily., Disp: 90 tablet, Rfl: 1   temazepam (RESTORIL) 15 MG capsule, TAKE 1 CAPSULE (15 MG TOTAL) BY MOUTH AT BEDTIME AS NEEDED. FOR SLEEP, Disp: 90 capsule, Rfl: 0   triamterene-hydrochlorothiazide (MAXZIDE-25) 37.5-25 MG tablet, TAKE 1 TABLET BY MOUTH IN  THE MORNING, Disp: 90 tablet, Rfl: 3  Review of Systems:  Constitutional: Denies fever, chills, diaphoresis, appetite change. HEENT: Denies photophobia, eye pain, redness, hearing loss, ear pain, congestion, sore throat, rhinorrhea, sneezing, mouth sores, trouble swallowing, neck pain, neck stiffness and tinnitus.   Respiratory: Denies SOB, DOE, cough, chest tightness,  and wheezing.   Cardiovascular: Denies chest pain, palpitations and leg swelling.  Gastrointestinal: Denies nausea, vomiting, abdominal pain, diarrhea,  blood in  stool and abdominal distention.  Genitourinary: Denies dysuria, urgency, frequency, hematuria, flank pain and difficulty urinating.  Endocrine: Denies: hot or cold intolerance, sweats, changes in hair or nails, polyuria, polydipsia. Musculoskeletal: Denies myalgias, back pain, joint swelling, arthralgias and gait problem.  Skin: Denies pallor, rash and wound.  Neurological: Denies dizziness, seizures, syncope, weakness, light-headedness, numbness and headaches.  Hematological: Denies adenopathy. Easy bruising, personal or family  bleeding history  Psychiatric/Behavioral: Denies suicidal ideation, mood changes, confusion, nervousness, sleep disturbance and agitation    Physical Exam: Vitals:   08/13/20 0828  BP: 130/80  Pulse: 65  Temp: 97.8 F (36.6 C)  TempSrc: Oral  SpO2: 99%  Weight: 184 lb 4.8 oz (83.6 kg)    Body mass index is 26.07 kg/m.   Constitutional: NAD, calm, comfortable Eyes: PERRL, lids and conjunctivae normal ENMT: Mucous membranes are moist.  Respiratory: clear to auscultation bilaterally, no wheezing, no crackles. Normal respiratory effort. No accessory muscle use.  Cardiovascular: Regular rate and rhythm, no murmurs / rubs / gallops. No extremity edema.  Neurologic: Grossly intact and nonfocal Psychiatric: Normal judgment and insight. Alert and oriented x 3. Normal mood.    Impression and Plan:  Dyslipidemia  - Plan: Lipid panel -May need to switch to a higher potency statin pending results.  Essential hypertension -Well-controlled.  Constipation, unspecified constipation type -Advised daily, continued use of Colace and MiraLAX.    Patient Instructions  -Nice seeing you today!!  -Lab work today; will notify you once results are available.      [name], MD [facility]

## 2020-08-14 ENCOUNTER — Other Ambulatory Visit: Payer: Self-pay | Admitting: Internal Medicine

## 2020-08-14 DIAGNOSIS — E785 Hyperlipidemia, unspecified: Secondary | ICD-10-CM

## 2020-08-14 MED ORDER — ATORVASTATIN CALCIUM 40 MG PO TABS: 40.0000 mg | ORAL_TABLET | Freq: Every day | ORAL | 1 refills | Status: DC

## 2020-08-25 ENCOUNTER — Other Ambulatory Visit: Payer: Self-pay | Admitting: Internal Medicine

## 2020-08-25 DIAGNOSIS — I10 Essential (primary) hypertension: Secondary | ICD-10-CM

## 2020-09-23 ENCOUNTER — Telehealth: Payer: Self-pay | Admitting: Pharmacist

## 2020-09-23 NOTE — Chronic Care Management (AMB) (Signed)
Chronic Care Management Pharmacy Assistant   Name: Richard Sparks  MRN: [id number] DOB: 1945-05-03  Reason for Encounter: Disease State   Conditions to be addressed/monitored: HTN  Recent office visits:  06.09.2022 [name], [name], MD(PCP) patient present for follow up seen for hypertension hyperlipidemia depression insomnia and chronic constipation.  Recent consult visits:  None  Hospital visits:  None in previous 6 months  Medications: Outpatient Encounter Medications as of 09/23/2020  Medication Sig   amLODipine (NORVASC) 10 MG tablet TAKE 1 TABLET BY MOUTH  DAILY   atorvastatin (LIPITOR) 40 MG tablet Take 1 tablet (40 mg total) by mouth daily.   b complex vitamins tablet Take 1 tablet by mouth daily.   buPROPion (WELLBUTRIN XL) 150 MG 24 hr tablet TAKE 1 TABLET BY MOUTH  DAILY   Cholecalciferol (VITAMIN D) 1000 UNITS capsule Take 1,000 Units by mouth daily.   fluticasone (FLONASE) 50 MCG/ACT nasal spray Place 1 spray into both nostrils daily.   loratadine (CLARITIN) 10 MG tablet Take 10 mg by mouth daily.   Omega-3 Fatty Acids (FISH OIL) 1000 MG CAPS Take 3 capsules by mouth daily.   oxymetazoline (AFRIN) 0.05 % nasal spray Place 1 spray into both nostrils 2 (two) times daily as needed for congestion.   pravastatin (PRAVACHOL) 80 MG tablet Take 1 tablet (80 mg total) by mouth daily.   temazepam (RESTORIL) 15 MG capsule TAKE 1 CAPSULE (15 MG TOTAL) BY MOUTH AT BEDTIME AS NEEDED. FOR SLEEP   triamterene-hydrochlorothiazide (MAXZIDE-25) 37.5-25 MG tablet TAKE 1 TABLET BY MOUTH IN  THE MORNING   No facility-administered encounter medications on file as of 09/23/2020.   Reviewed chart prior to disease state call. Spoke with patient regarding BP  Recent Office Vitals: BP Readings from Last 3 Encounters:  08/13/20 130/80  05/29/20 130/80  02/13/20 110/70   Pulse Readings from Last 3 Encounters:  08/13/20 65  05/29/20 88  02/13/20 (!) 59    Wt Readings from  Last 3 Encounters:  08/13/20 184 lb 4.8 oz (83.6 kg)  05/29/20 184 lb 12.8 oz (83.8 kg)  02/13/20 184 lb 8 oz (83.7 kg)     Kidney Function Lab Results  Component Value Date/Time   CREATININE 1.23 02/13/2020 07:42 AM   CREATININE 1.22 11/22/2018 10:26 AM   GFR 58.25 (L) 02/13/2020 07:42 AM   GFRNONAA 81 01/18/2008 08:23 AM   GFRAA 98 01/18/2008 08:23 AM    BMP Latest Ref Rng & Units 02/13/2020 11/22/2018 11/20/2017  Glucose 70 - 99 mg/dL 103(H) 95 95  BUN 6 - 23 mg/dL 22 19 17   Creatinine 0.40 - 1.50 mg/dL 1.23 1.22 1.14  Sodium 135 - 145 mEq/L 142 140 141  Potassium 3.5 - 5.1 mEq/L 3.6 3.8 3.9  Chloride 96 - 112 mEq/L 103 103 104  CO2 19 - 32 mEq/L 31 29 30   Calcium 8.4 - 10.5 mg/dL 9.7 10.1 9.4   Current antihypertensive regimen:  Amlodipine 10 mg 1 tablet daily - taking it at lunch Triamterene-HCTZ 37.5-25 mg 1 tablet daily in the morning How often are you checking your Blood Pressure? when feeling symptomatic Current home BP readings: None What recent interventions/DTPs have been made by any provider to improve Blood Pressure control since last CPP Visit: None Any recent hospitalizations or ED visits since last visit with CPP? No What diet changes have been made to improve Blood Pressure Control?  No Change What exercise is being done to improve your Blood Pressure Control?  No Change  Adherence Review: Is the patient currently on ACE/ARB medication? No Does the patient have >5 day gap between last estimated fill dates? No  I spoke with the patient about medication adherence. He states that he has been doing well. He states that he does not take his blood pressure consistently only when he feels symptomatic. He stated the last couple of times that he took his blood pressure it was good. He does not have any recent readings. There have been no changes in his medications. He is currently not experiencing any side effects from his medications. There have been no injuries or  false senses last CPP or PCP visit. There have been no emergency department hospital visits or urgent care visits since his last visit. He is scheduled for his next CCM appointment in November of 2022. He currently uses [facility] order and does not have any issues.  Care Gaps: COVID-19 Booster 2 TDAP  Star Rating Drugs: Medication Dispensed Quantity Pharmacy  Glimepiride 4 mg 05.17.2022 90 [facility]  Metformin 500 mg 05.17.2022 90 [facility]  Atorvastatin 40 mg 03.14.2022 90 [facility]   Amilia Revonda Standard, [place] Pharmacist Assistant [phone]

## 2020-09-24 ENCOUNTER — Other Ambulatory Visit: Payer: Self-pay | Admitting: Internal Medicine

## 2020-09-24 DIAGNOSIS — G4700 Insomnia, unspecified: Secondary | ICD-10-CM

## 2020-11-17 ENCOUNTER — Telehealth: Payer: Self-pay | Admitting: Pharmacist

## 2020-11-17 NOTE — Chronic Care Management (AMB) (Signed)
Chronic Care Management Pharmacy Assistant   Name: Richard Sparks  MRN: [id number] DOB: 10-30-1945   Reason for Encounter: Disease State/ General Assessment Call.   Conditions to be addressed/monitored: HTN and HLD   Recent office visits:  None.  Recent consult visits:  None.  Hospital visits:  None in previous 6 months  Medications: Outpatient Encounter Medications as of 11/17/2020  Medication Sig   temazepam (RESTORIL) 15 MG capsule TAKE 1 CAPSULE BY MOUTH AT BEDTIME AS NEEDED FOR SLEEP   amLODipine (NORVASC) 10 MG tablet TAKE 1 TABLET BY MOUTH  DAILY   atorvastatin (LIPITOR) 40 MG tablet Take 1 tablet (40 mg total) by mouth daily.   b complex vitamins tablet Take 1 tablet by mouth daily.   buPROPion (WELLBUTRIN XL) 150 MG 24 hr tablet TAKE 1 TABLET BY MOUTH  DAILY   Cholecalciferol (VITAMIN D) 1000 UNITS capsule Take 1,000 Units by mouth daily.   fluticasone (FLONASE) 50 MCG/ACT nasal spray Place 1 spray into both nostrils daily.   loratadine (CLARITIN) 10 MG tablet Take 10 mg by mouth daily.   Omega-3 Fatty Acids (FISH OIL) 1000 MG CAPS Take 3 capsules by mouth daily.   oxymetazoline (AFRIN) 0.05 % nasal spray Place 1 spray into both nostrils 2 (two) times daily as needed for congestion.   pravastatin (PRAVACHOL) 80 MG tablet Take 1 tablet (80 mg total) by mouth daily.   triamterene-hydrochlorothiazide (MAXZIDE-25) 37.5-25 MG tablet TAKE 1 TABLET BY MOUTH IN  THE MORNING   No facility-administered encounter medications on file as of 11/17/2020.   Fill History: ATORVASTATIN CALCIUM  40 MG TABS 11/14/2020 90   PRAVASTATIN SODIUM 80 MG TAB 05/30/2020 90   TEMAZEPAM 15 MG CAPSULE 09/28/2020 90   TRIAMTERENE/HYDROCHLOROTHIAZIDE  37.5-25 MG TABS 11/14/2020 90   AMLODIPINE BESYLATE  10 MG TABS 11/14/2020 90   BUPROPION HYDROCHLORIDE ER (XL)  150 MG TB24 11/14/2020 90   Reviewed chart prior to disease state call. Spoke with patient regarding BP  Recent Office  Vitals: BP Readings from Last 3 Encounters:  08/13/20 130/80  05/29/20 130/80  02/13/20 110/70   Pulse Readings from Last 3 Encounters:  08/13/20 65  05/29/20 88  02/13/20 (!) 59    Wt Readings from Last 3 Encounters:  08/13/20 184 lb 4.8 oz (83.6 kg)  05/29/20 184 lb 12.8 oz (83.8 kg)  02/13/20 184 lb 8 oz (83.7 kg)     Kidney Function Lab Results  Component Value Date/Time   CREATININE 1.23 02/13/2020 07:42 AM   CREATININE 1.22 11/22/2018 10:26 AM   GFR 58.25 (L) 02/13/2020 07:42 AM   GFRNONAA 81 01/18/2008 08:23 AM   GFRAA 98 01/18/2008 08:23 AM    BMP Latest Ref Rng & Units 02/13/2020 11/22/2018 11/20/2017  Glucose 70 - 99 mg/dL 103(H) 95 95  BUN 6 - 23 mg/dL '22 19 17  '$ Creatinine 0.40 - 1.50 mg/dL 1.23 1.22 1.14  Sodium 135 - 145 mEq/L 142 140 141  Potassium 3.5 - 5.1 mEq/L 3.6 3.8 3.9  Chloride 96 - 112 mEq/L 103 103 104  CO2 19 - 32 mEq/L '31 29 30  '$ Calcium 8.4 - 10.5 mg/dL 9.7 10.1 9.4    Current antihypertensive regimen:  Amlodipine 10 mg 1 tablet daily - taking it at lunch Triamterene-HCTZ 37.5-25 mg 1 tablet daily in the morning How often are you checking your Blood Pressure? infrequently Current home BP readings: runs between 120s over 80s to 140s over 90s per patient.  What  recent interventions/DTPs have been made by any provider to improve Blood Pressure control since last CPP Visit: None. Any recent hospitalizations or ED visits since last visit with CPP? No  Adherence Review: Is the patient currently on ACE/ARB medication? No Does the patient have >5 day gap between last estimated fill dates? No   Comprehensive medication review performed; Spoke to patient regarding cholesterol  Lipid Panel    Component Value Date/Time   CHOL 178 08/13/2020 0852   TRIG 213.0 (H) 08/13/2020 0852   TRIG 222 (HH) 01/13/2006 0944   HDL 43.60 08/13/2020 0852   LDLCALC 96 10/10/2013 0810   LDLDIRECT 112.0 08/13/2020 0852    10-year ASCVD risk score: The 10-year  ASCVD risk score (Arnett DK, et al., 2019) is: 27.6%   Values used to calculate the score:     Age: 75 years     Sex: Male     Is Non-Hispanic African American: No     Diabetic: No     Tobacco smoker: No     Systolic Blood Pressure: [id number]     Is BP treated: Yes     HDL Cholesterol: 43.6 mg/dL     Total Cholesterol: 178 mg/dL  Current antihyperlipidemic regimen:  Atorvastatin '40mg'$  - take 1 tablet daily. Fish oil '1000mg'$  - 4 capsules daily. Previous antihyperlipidemic medications tried: pravastatin. ASCVD risk enhancing conditions: age >50 and HTN What recent interventions/DTPs have been made by any provider to improve Cholesterol control since last CPP Visit: pravastatin changed to atorvastatin. Any recent hospitalizations or ED visits since last visit with CPP? No  Adherence Review: Does the patient have >5 day gap between last estimated fill dates? No  Notes: Spoke with patient and reviewed all medications as prescribed. Patient reports taking all medications besides the flonase and claritin. Patient is now taking curcumin daily as well. Patient has not been checking his blood pressure but here and there. He stated his machine has not been keeping the numbers stored but he was able to give me a range of what they tend to run. Patient stated he eats a high fiber diet and he doe snot add salt to his food. He dies eat red meat a couple times a week and he had chili for dinner last night. Tonight he is going to have cabbage stew. He drinks plenty of water and attends a yoga class 3 times a week. He also walks and is able to lift weights at the gym. Patient thanked me for my call.  Care Gaps:  AWV - message sent to Ramond Craver CMA to schedule. Covid-19 vaccine dose 2 - overdue since 08/09/19 Tetanus/TDAP -  overdue sicne 09/06/20 Flu vaccine - due  Star Rating Drugs:  Atorvastatin '40mg'$  - last filled on 11/14/20 90DS at [facility] [phone]

## 2021-01-15 ENCOUNTER — Other Ambulatory Visit: Payer: Self-pay | Admitting: Internal Medicine

## 2021-01-19 ENCOUNTER — Telehealth: Payer: Self-pay | Admitting: Pharmacist

## 2021-01-19 NOTE — Chronic Care Management (AMB) (Signed)
    Chronic Care Management Pharmacy Assistant   Name: Richard Sparks  MRN: [id number] DOB: [birth year]  01/20/2021 APPOINTMENT REMINDER  Richard Sparks was reminded to have all medications, supplements and any blood glucose and blood pressure readings available for review with Richard Sparks, Pharm. D, at his telephone visit on 01/20/2021 at 11:00.   Questions: Have you had any recent office visit or specialist visit outside of [place]? No  Are there any concerns you would like to discuss during your office visit? Intestinal issues that aren't improving  Are you having any problems obtaining your medications? (Whether it pharmacy issues or cost) No  If patient has any PAP medications ask if they are having any problems getting their PAP medication or refill? No  Care Gaps: AWV - message sent to Ramond Craver CMA to schedule. Covid-19 vaccine dose 2 - overdue since 08/09/19 Tetanus/TDAP -  overdue sicne 09/06/20 Flu vaccine - due Last BP - 130/80 on 08/13/2020  Star Rating Drug: Atorvastatin 40mg  - last filled on 11/14/20 90DS at [facility]  Any gaps in medications fill history? No  [place]  Catering manager [phone]

## 2021-01-20 ENCOUNTER — Telehealth: Payer: Self-pay | Admitting: *Deleted

## 2021-01-20 ENCOUNTER — Ambulatory Visit (INDEPENDENT_AMBULATORY_CARE_PROVIDER_SITE_OTHER): Payer: Medicare Other | Admitting: Pharmacist

## 2021-01-20 DIAGNOSIS — I10 Essential (primary) hypertension: Secondary | ICD-10-CM

## 2021-01-20 DIAGNOSIS — E785 Hyperlipidemia, unspecified: Secondary | ICD-10-CM

## 2021-01-20 MED ORDER — TRIAMTERENE-HCTZ 37.5-25 MG PO TABS: 1.0000 | ORAL_TABLET | Freq: Every morning | ORAL | 0 refills | Status: DC

## 2021-01-20 MED ORDER — BUPROPION HCL ER (XL) 150 MG PO TB24: 150.0000 mg | ORAL_TABLET | Freq: Every day | ORAL | 0 refills | Status: DC

## 2021-01-20 MED ORDER — ATORVASTATIN CALCIUM 40 MG PO TABS: 40.0000 mg | ORAL_TABLET | Freq: Every day | ORAL | 0 refills | Status: DC

## 2021-01-20 MED ORDER — AMLODIPINE BESYLATE 10 MG PO TABS: 10.0000 mg | ORAL_TABLET | Freq: Every day | ORAL | 0 refills | Status: DC

## 2021-01-20 NOTE — Telephone Encounter (Signed)
Refills sent

## 2021-01-20 NOTE — Progress Notes (Signed)
Chronic Care Management Pharmacy Note  01/20/2021 Name:  Richard Sparks MRN:  [id number] DOB:  02/27/1946  Summary: BP at goal < 140/90 LDL not at goal < 100   Recommendations/Changes made from today's visit: -Recommended bringing BP cuff into office visit to ensure accuracy -Consider trial of Senna for further assistance with motility -Requested gastroenterology referral   Plan: BP and HLD assessment in 3 months  Subjective: Richard Sparks is an 75 y.o. year old male who is a primary patient of [name], [name], MD.  The CCM team was consulted for assistance with disease management and care coordination needs.    Engaged with patient by telephone for follow up visit in response to provider referral for pharmacy case management and/or care coordination services.   Consent to Services:  The patient was given information about Chronic Care Management services, agreed to services, and gave verbal consent prior to initiation of services.  Please see initial visit note for detailed documentation.   Patient Care Team: [name], [name], MD as PCP - General (Internal Medicine) [name], [facility] as Pharmacist (Pharmacist)  Recent office visits: 08/13/20 Richard Frohlich, MD: patient presented for follow up for chronic conditions.  Recent consult visits: 02/08/2019 Richard Aland, RN (endoscopy center): Patient presented for colonoscopy prep due to personal history of colonic polyps.   12/10/2018 Richard Sparks (optometry): Patient presented for eye exam. Unable to access notes.  Hospital visits: None in previous 6 months  Objective:  Lab Results  Component Value Date   CREATININE 1.23 02/13/2020   BUN 22 02/13/2020   GFR 58.25 (L) 02/13/2020   GFRNONAA 81 01/18/2008   GFRAA 98 01/18/2008   NA 142 02/13/2020   K 3.6 02/13/2020   CALCIUM 9.7 02/13/2020   CO2 31 02/13/2020   GLUCOSE 103 (H) 02/13/2020    Lab Results  Component Value Date/Time    HGBA1C 5.6 02/13/2020 07:42 AM   HGBA1C 5.6 11/22/2018 10:26 AM   GFR 58.25 (L) 02/13/2020 07:42 AM   GFR 58.35 (L) 11/22/2018 10:26 AM    Last diabetic Eye exam: No results found for: HMDIABEYEEXA  Last diabetic Foot exam: No results found for: HMDIABFOOTEX   Lab Results  Component Value Date   CHOL 178 08/13/2020   HDL 43.60 08/13/2020   LDLCALC 96 10/10/2013   LDLDIRECT 112.0 08/13/2020   TRIG 213.0 (H) 08/13/2020   CHOLHDL 4 08/13/2020    Hepatic Function Latest Ref Rng & Units 02/13/2020 11/22/2018 11/20/2017  Total Protein 6.0 - 8.3 g/dL 6.7 6.7 6.3  Albumin 3.5 - 5.2 g/dL 4.4 4.4 4.1  AST 0 - 37 U/L _0 ALT 0 - 53 U/L _1 Alk Phosphatase 39 - 117 U/L 56 59 57  Total Bilirubin 0.2 - 1.2 mg/dL 0.9 0.9 0.8  Bilirubin, Direct 0.0 - 0.3 mg/dL - - -    Lab Results  Component Value Date/Time   TSH 1.91 02/13/2020 07:42 AM   TSH 1.49 11/22/2018 10:26 AM    CBC Latest Ref Rng & Units 02/13/2020 11/22/2018 11/20/2017  WBC 4.0 - 10.5 K/uL 7.5 7.6 7.2  Hemoglobin 13.0 - 17.0 g/dL 15.9 17.3(H) 15.9  Hematocrit 39.0 - 52.0 % 46.3 49.1 45.5  Platelets 150.0 - 400.0 K/uL 237.0 234.0 235.0    Lab Results  Component Value Date/Time   VD25OH 44.50 02/13/2020 07:42 AM   VD25OH 78.03 11/22/2018 10:26 AM    Clinical ASCVD: No  The  10-year ASCVD risk score (Arnett DK, et al., 2019) is: 27.6%   Values used to calculate the score:     Age: 34 years     Sex: Male     Is Non-Hispanic African American: No     Diabetic: No     Tobacco smoker: No     Systolic Blood Pressure: 425 mmHg     Is BP treated: Yes     HDL Cholesterol: 43.6 mg/dL     Total Cholesterol: 178 mg/dL    Depression screen [facility] 2/9 05/29/2020 02/13/2020 08/20/2019  Decreased Interest 1 1 0  Down, Depressed, Hopeless 3 1 0  PHQ - 2 Score 4 2 0  Altered sleeping _0 Tired, decreased energy _1 Change in appetite 0 0 0  Feeling bad or failure about yourself  1 0 0  Trouble concentrating 1 0 0   Moving slowly or fidgety/restless 0 0 0  Suicidal thoughts 0 0 0  PHQ-9 Score _2 Difficult doing work/chores Not difficult at all Not difficult at all Not difficult at all      Social History   Tobacco Use  Smoking Status Never  Smokeless Tobacco Never   BP Readings from Last 3 Encounters:  08/13/20 130/80  05/29/20 130/80  02/13/20 110/70   Pulse Readings from Last 3 Encounters:  08/13/20 65  05/29/20 88  02/13/20 (!) 59   Wt Readings from Last 3 Encounters:  08/13/20 184 lb 4.8 oz (83.6 kg)  05/29/20 184 lb 12.8 oz (83.8 kg)  02/13/20 184 lb 8 oz (83.7 kg)   BMI Readings from Last 3 Encounters:  08/13/20 26.07 kg/m  05/29/20 26.14 kg/m  02/13/20 26.10 kg/m    Assessment/Interventions: Review of patient past medical history, allergies, medications, health status, including review of consultants reports, laboratory and other test data, was performed as part of comprehensive evaluation and provision of chronic care management services.   SDOH:  (Social Determinants of Health) assessments and interventions performed: No  SDOH Screenings   Alcohol Screen: Not on file  Depression (PHQ2-9): Medium Risk   PHQ-2 Score: 9  Financial Resource Strain: Not on file  Food Insecurity: Not on file  Housing: Not on file  Physical Activity: Not on file  Social Connections: Not on file  Stress: Not on file  Tobacco Use: Low Risk    Smoking Tobacco Use: Never   Smokeless Tobacco Use: Never   Passive Exposure: Not on file  Transportation Needs: Not on file    [place]  Allergies  Allergen Reactions   Amoxicillin Rash    Medications Reviewed Today     Reviewed by [name], [facility] (Pharmacist) on 01/20/21 at [place] List Status: <None>   Medication Order Taking? Sig Documenting Provider Last Dose Status Informant  amLODipine (NORVASC) 10 MG tablet [id number]  TAKE 1 TABLET BY MOUTH  DAILY [name], [name], MD  Active   atorvastatin  (LIPITOR) 40 MG tablet [id number]  TAKE 1 TABLET BY MOUTH  DAILY [name], [name], MD  Active   b complex vitamins tablet [id number] No Take 1 tablet by mouth daily. [provider] Taking Active   buPROPion (WELLBUTRIN XL) 150 MG 24 hr tablet [id number]  TAKE 1 TABLET BY MOUTH  DAILY [name], [name], MD  Active   Cholecalciferol (VITAMIN D) 1000 UNITS capsule [id number] No Take 1,000 Units by mouth daily. [provider] Taking Active  fluticasone (FLONASE) 50 MCG/ACT nasal spray [id number] No Place 1 spray into both nostrils daily. Marletta Lor, MD Taking Active   loratadine (CLARITIN) 10 MG tablet [id number] No Take 10 mg by mouth daily. [provider] Taking Active   Omega-3 Fatty Acids (FISH OIL) 1000 MG CAPS [id number] No Take 3 capsules by mouth daily. [provider] Taking Active   oxymetazoline (AFRIN) 0.05 % nasal spray [id number] No Place 1 spray into both nostrils 2 (two) times daily as needed for congestion. [provider] Taking Active   Discontinued 01/20/21 1115 (Change in therapy)   temazepam (RESTORIL) 15 MG capsule [id number]  TAKE 1 CAPSULE BY MOUTH AT BEDTIME AS NEEDED FOR SLEEP [name], [name], MD  Active   triamterene-hydrochlorothiazide [facility]) 37.5-25 MG tablet [id number]  TAKE 1 TABLET BY MOUTH IN  THE MORNING [name], [name], MD  Active             Patient Active Problem List   Diagnosis Date Noted   History of colonic polyps 11/05/2014   Dyslipidemia 08/01/2006   Depression, recurrent ([place]) 08/01/2006   Essential hypertension 08/01/2006   BPH (benign prostatic hyperplasia) 08/01/2006    Immunization History  Administered Date(s) Administered   Influenza, High Dose Seasonal PF 01/06/2020   Influenza,inj,Quad PF,6+ Mos 11/05/2014   Janssen (J&J) SARS-COV-2 Vaccination 06/14/2019   Pneumococcal Conjugate-13 04/26/2013   Pneumococcal Polysaccharide-23 10/25/2011   Tdap  09/07/2010   Zoster Recombinat (Shingrix) 02/07/2020, 05/12/2020   Zoster, Live 10/25/2012   Patient reports it has been a tough week as he had to put down his dog on Monday. 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  Patient is also still having ongoing issues with regular bowel movements. He stopped taking Miralax and did not notice any difference in his bowel movements. He is still using metamucil every day.  Conditions to be addressed/monitored:  Hypertension, Hyperlipidemia, Depression, BPH, Allergic Rhinitis and Insomnia  Conditions addressed this visit: Hypertension, hyperlipidemia  Care Plan : CCM Pharmacy Care Plan  Updates made by [name], [place] since 01/20/2021 12:00 AM     Problem: Problem: Hypertension, Hyperlipidemia, Depression, BPH, Allergic Rhinitis and Insomnia      Long-Range Goal: Patient-Specific Goal   Start Date: 07/22/2020  Expected End Date: 07/22/2021  Recent Progress: On track  Priority: High  Note:   Current Barriers:  Unable to independently monitor therapeutic efficacy Unable to achieve control of cholesterol   Pharmacist Clinical Goal(s):  Patient will achieve adherence to monitoring guidelines and medication adherence to achieve therapeutic efficacy achieve control of cholesterol as evidenced by next lipid panel  through collaboration with PharmD and provider.   Interventions: 1:1 collaboration with [name], [name], MD regarding development and update of comprehensive plan of care as evidenced by provider attestation and co-signature Inter-disciplinary care team collaboration (see longitudinal plan of care) Comprehensive medication review performed; medication list updated in electronic medical record  Hypertension (BP goal <140/90) -Controlled -Current treatment: Amlodipine 10 mg 1 tablet daily - taking it at lunch Triamterene-HCTZ 37.5-25 mg 1 tablet daily in the morning -Medications previously tried:  none -Current home readings: 137/77, 144/82, 159/84, 155/78, 147/84 -Current dietary habits: leaves salt out of recipes and uses sea salt -Current exercise habits: walking 30-45 minutes with dogs -Denies hypotensive/hypertensive symptoms -Educated on BP goals and benefits of medications for prevention of heart attack, stroke and kidney damage; Exercise goal of 150 minutes per week; Importance of home blood  pressure monitoring; Proper BP monitoring technique; -Counseled to monitor BP at home weekly, document, and provide log at future appointments -Counseled on diet and exercise extensively Recommended to continue current medication Recommended setting a weekly alarm to remember to check blood pressure.  Hyperlipidemia: (LDL goal < 100) -Uncontrolled -Current treatment: Atorvastatin 40 mg 1 tablet daily Omega 3 fatty acids 1000 mg 3 capsules daily -Medications previously tried: Lopid (gemfibrozil), pravastatin (ineffective) -Current dietary patterns: did not discuss -Current exercise habits: walking 30-45 minutes with dogs -Educated on Cholesterol goals;  Benefits of statin for ASCVD risk reduction; Exercise goal of 150 minutes per week; -Counseled on diet and exercise extensively Recommended to continue current medication Recommended repeat lipid panel  Depression(Goal: minimize symptoms) -Controlled -Current treatment: Bupropion XL 150 mg 1 tablet daily -Medications previously tried/failed: none -PHQ9: 9 -Educated on Benefits of medication for symptom control Benefits of cognitive-behavioral therapy with or without medication -Recommended to continue current medication  Insomnia (Goal: improve quality and quantity of sleep) -Uncontrolled -Current treatment  Temazepam 15 mg 1 tablet at bedtime PRN sleep -Medications previously tried: Rozerem (ineffective), Sonata (unknown) -Recommended trial period of taking melatonin when he wakes up in the night to see if this helps but  recommended taking a low dose -Recommended avoiding Benadryl due to risk of falls  Allergic rhinitis (Goal: minimize symptoms) -Controlled -Current treatment  Claritin 10 mg 1 tablet daily Flonase 50 mcg/act 1 spray in both nostrils daily Afrin 0.05% 1 spray in both nostrils twice daily as needed -Medications previously tried: none  -Counseled on limiting Afrin use to caution with rebound congestion and increasing BP; avoiding allergy triggers   Health Maintenance -Vaccine gaps: tetanus, COVID booster (doesn't want to do MRNA vaccines), influenza -Current therapy:  Vitamin B complex 1 tablet daily Vitamin D 1000 units 1 capsule daily -Educated on Cost vs benefit of each product must be carefully weighed by individual consumer -Patient is satisfied with current therapy and denies issues -Recommended to continue current medication  Patient Goals/Self-Care Activities Patient will:  - take medications as prescribed check blood pressure weekly, document, and provide at future appointments target a minimum of 150 minutes of moderate intensity exercise weekly  Follow Up Plan: Telephone follow up appointment with care management team member scheduled for: 1 year        Medication Assistance: None required.  Patient affirms current coverage meets needs.  Compliance/Adherence/Medication fill history: Care Gaps: Tetanus, COVID booster, influenza Last BP - 130/80 on 08/13/2020   Star-Rating Drugs: Atorvastatin 67m - [facility] [place] [facility] [place] Patient's [facility] pharmacy [facility]:  [facility] [address] [place] [address]Phone: [phone]: [phone] [facility] ([place] [facility] [place]Phone: [phone]: [phone] [facility]) - [place] [address]Phone: [phone]: [phone] Uses pill box? No - puts medicines on a plastic lid in 3 batches; uses a pillbox when he is not going to be home for lunch Pt endorses 100% compliance  We discussed: Current pharmacy is preferred with insurance plan and patient is satisfied with pharmacy services Patient decided to: Continue current medication management strategy  Care Plan and Follow Up Patient Decision:  Patient agrees to Care Plan and Follow-up.  Plan: The care management team will reach out to the patient again over the next 30 days.  Jeni Salles, PharmD [facility] Clinical Pharmacist [place] [facility] [place]

## 2021-01-20 NOTE — Telephone Encounter (Signed)
-----   Message from [name], [facility] sent at 01/20/2021  1:25 PM EST ----- Regarding: Refills Hi,  I just spoke with Shanon Brow and he requested refills for his maintenance meds amlodipine, triamterene-HCTZ, bupropion and atorvastatin to be sent to his mail order pharmacy. He said he will run out prior to his visit with Dr. Lemmie Evens in December.  Thank you! Maddie

## 2021-01-20 NOTE — Patient Instructions (Signed)
Hi Richard Sparks,  It was great to get to catch up with you again! Don't forget to bring your blood pressure cuff to your office visit in December.  Please reach out to me if you have any questions or need anything !  Best, Richard Sparks  Richard Sparks, PharmD, [place] [facility] [place]   Visit Information   Goals Addressed   None    Patient Care Plan: CCM Pharmacy Care Plan     Problem Identified: Problem: Hypertension, Hyperlipidemia, Depression, BPH, Allergic Rhinitis and Insomnia      Long-Range Goal: Patient-Specific Goal   Start Date: 07/22/2020  Expected End Date: 07/22/2021  Recent Progress: On track  Priority: High  Note:   Current Barriers:  Unable to independently monitor therapeutic efficacy Unable to achieve control of cholesterol   Pharmacist Clinical Goal(s):  Patient will achieve adherence to monitoring guidelines and medication adherence to achieve therapeutic efficacy achieve control of cholesterol as evidenced by next lipid panel  through collaboration with PharmD and provider.   Interventions: 1:1 collaboration with [name], [name], MD regarding development and update of comprehensive plan of care as evidenced by provider attestation and co-signature Inter-disciplinary care team collaboration (see longitudinal plan of care) Comprehensive medication review performed; medication list updated in electronic medical record  Hypertension (BP goal <140/90) -Controlled -Current treatment: Amlodipine 10 mg 1 tablet daily - taking it at lunch Triamterene-HCTZ 37.5-25 mg 1 tablet daily in the morning -Medications previously tried: none -Current home readings: 137/77, 144/82, 159/84, 155/78, 147/84 -Current dietary habits: leaves salt out of recipes and uses sea salt -Current exercise habits: walking 30-45 minutes with dogs -Denies hypotensive/hypertensive symptoms -Educated on BP goals and benefits of medications  for prevention of heart attack, stroke and kidney damage; Exercise goal of 150 minutes per week; Importance of home blood pressure monitoring; Proper BP monitoring technique; -Counseled to monitor BP at home weekly, document, and provide log at future appointments -Counseled on diet and exercise extensively Recommended to continue current medication Recommended setting a weekly alarm to remember to check blood pressure.  Hyperlipidemia: (LDL goal < 100) -Uncontrolled -Current treatment: Atorvastatin 40 mg 1 tablet daily Omega 3 fatty acids 1000 mg 3 capsules daily -Medications previously tried: Lopid (gemfibrozil), pravastatin (ineffective) -Current dietary patterns: did not discuss -Current exercise habits: walking 30-45 minutes with dogs -Educated on Cholesterol goals;  Benefits of statin for ASCVD risk reduction; Exercise goal of 150 minutes per week; -Counseled on diet and exercise extensively Recommended to continue current medication Recommended repeat lipid panel  Depression(Goal: minimize symptoms) -Controlled -Current treatment: Bupropion XL 150 mg 1 tablet daily -Medications previously tried/failed: none -PHQ9: 9 -Educated on Benefits of medication for symptom control Benefits of cognitive-behavioral therapy with or without medication -Recommended to continue current medication  Insomnia (Goal: improve quality and quantity of sleep) -Uncontrolled -Current treatment  Temazepam 15 mg 1 tablet at bedtime PRN sleep -Medications previously tried: Rozerem (ineffective), Sonata (unknown) -Recommended trial period of taking melatonin when he wakes up in the night to see if this helps but recommended taking a low dose -Recommended avoiding Benadryl due to risk of falls  Allergic rhinitis (Goal: minimize symptoms) -Controlled -Current treatment  Claritin 10 mg 1 tablet daily Flonase 50 mcg/act 1 spray in both nostrils daily Afrin 0.05% 1 spray in both nostrils twice  daily as needed -Medications previously tried: none  -Counseled on limiting Afrin use to caution with rebound congestion and increasing BP; avoiding allergy triggers   Health  Maintenance -Vaccine gaps: tetanus, COVID booster (doesn't want to do MRNA vaccines), influenza -Current therapy:  Vitamin B complex 1 tablet daily Vitamin D 1000 units 1 capsule daily -Educated on Cost vs benefit of each product must be carefully weighed by individual consumer -Patient is satisfied with current therapy and denies issues -Recommended to continue current medication  Patient Goals/Self-Care Activities Patient will:  - take medications as prescribed check blood pressure weekly, document, and provide at future appointments target a minimum of 150 minutes of moderate intensity exercise weekly  Follow Up Plan: Telephone follow up appointment with care management team member scheduled for: 1 year       Patient verbalizes understanding of instructions provided today and agrees to view in [place].  The pharmacy team will reach out to the patient again over the next 30 days.   [name], [facility]

## 2021-01-21 ENCOUNTER — Telehealth: Payer: Self-pay | Admitting: *Deleted

## 2021-01-21 DIAGNOSIS — R198 Other specified symptoms and signs involving the digestive system and abdomen: Secondary | ICD-10-CM

## 2021-01-21 DIAGNOSIS — K5900 Constipation, unspecified: Secondary | ICD-10-CM

## 2021-01-21 NOTE — Telephone Encounter (Signed)
Referral placed.

## 2021-01-21 NOTE — Telephone Encounter (Signed)
-----   Message from Erline Hau, MD sent at 01/21/2021  7:16 AM EST ----- Regarding: FW: GI referral Please see Maddie's message below. I am ok with GI referral if pt would like. ----- Message ----- From: [name], [place]: 01/20/2021   1:28 PM EST To: Erline Hau, MD Subject: GI referral                                    Hi,  Is there any chance you can place a referral for GI for [place]? He is still having ongoing issues with regular bowel movements and doesn't feel like anything is helping as far as the meds he's tried.  Thank you! Maddie

## 2021-02-03 DIAGNOSIS — E785 Hyperlipidemia, unspecified: Secondary | ICD-10-CM

## 2021-02-03 DIAGNOSIS — I10 Essential (primary) hypertension: Secondary | ICD-10-CM | POA: Diagnosis not present

## 2021-02-16 ENCOUNTER — Ambulatory Visit (INDEPENDENT_AMBULATORY_CARE_PROVIDER_SITE_OTHER): Payer: Medicare Other | Admitting: Internal Medicine

## 2021-02-16 ENCOUNTER — Encounter: Payer: Self-pay | Admitting: Internal Medicine

## 2021-02-16 VITALS — BP 120/70 | HR 59 | Temp 97.8°F | Ht 71.0 in | Wt 181.7 lb

## 2021-02-16 DIAGNOSIS — G4700 Insomnia, unspecified: Secondary | ICD-10-CM | POA: Diagnosis not present

## 2021-02-16 DIAGNOSIS — F339 Major depressive disorder, recurrent, unspecified: Secondary | ICD-10-CM | POA: Diagnosis not present

## 2021-02-16 DIAGNOSIS — Z0000 Encounter for general adult medical examination without abnormal findings: Secondary | ICD-10-CM

## 2021-02-16 DIAGNOSIS — E785 Hyperlipidemia, unspecified: Secondary | ICD-10-CM

## 2021-02-16 DIAGNOSIS — I10 Essential (primary) hypertension: Secondary | ICD-10-CM

## 2021-02-16 DIAGNOSIS — K5900 Constipation, unspecified: Secondary | ICD-10-CM

## 2021-02-16 LAB — LIPID PANEL
Cholesterol: 147 mg/dL (ref 0–200)
HDL: 45 mg/dL (ref >39.00)
LDL Cholesterol: 76 mg/dL (ref 0–99)
NonHDL: 101.57
Total CHOL/HDL Ratio: 3
Triglycerides: 128 mg/dL (ref 0.0–149.0)
VLDL: 25.6 mg/dL (ref 0.0–40.0)

## 2021-02-16 LAB — CBC WITH DIFFERENTIAL/PLATELET
Basophils Absolute: 0.1 10*3/uL (ref 0.0–0.1)
Basophils Relative: 0.7 % (ref 0.0–3.0)
Eosinophils Absolute: 0.2 10*3/uL (ref 0.0–0.7)
Eosinophils Relative: 2.6 % (ref 0.0–5.0)
HCT: 45.5 % (ref 39.0–52.0)
Hemoglobin: 16 g/dL (ref 13.0–17.0)
Lymphocytes Relative: 23.5 % (ref 12.0–46.0)
Lymphs Abs: 1.9 10*3/uL (ref 0.7–4.0)
MCHC: 35 g/dL (ref 30.0–36.0)
MCV: 86.1 fl (ref 78.0–100.0)
Monocytes Absolute: 0.7 10*3/uL (ref 0.1–1.0)
Monocytes Relative: 8.4 % (ref 3.0–12.0)
Neutro Abs: 5.2 10*3/uL (ref 1.4–7.7)
Neutrophils Relative %: 64.8 % (ref 43.0–77.0)
Platelets: 221 10*3/uL (ref 150.0–400.0)
RBC: 5.29 Mil/uL (ref 4.22–5.81)
RDW: 12.8 % (ref 11.5–15.5)
WBC: 8 10*3/uL (ref 4.0–10.5)

## 2021-02-16 LAB — COMPREHENSIVE METABOLIC PANEL
ALT: 21 U/L (ref 0–53)
AST: 15 U/L (ref 0–37)
Albumin: 4.3 g/dL (ref 3.5–5.2)
Alkaline Phosphatase: 71 U/L (ref 39–117)
BUN: 15 mg/dL (ref 6–23)
CO2: 29 mEq/L (ref 19–32)
Calcium: 9.7 mg/dL (ref 8.4–10.5)
Chloride: 104 mEq/L (ref 96–112)
Creatinine, Ser: 1.18 mg/dL (ref 0.40–1.50)
GFR: 60.79 mL/min (ref >60.00)
Glucose, Bld: 110 mg/dL — ABNORMAL HIGH (ref 70–99)
Potassium: 3.7 mEq/L (ref 3.5–5.1)
Sodium: 141 mEq/L (ref 135–145)
Total Bilirubin: 1.1 mg/dL (ref 0.2–1.2)
Total Protein: 6.4 g/dL (ref 6.0–8.3)

## 2021-02-16 LAB — TSH: TSH: 1.34 u[IU]/mL (ref 0.35–5.50)

## 2021-02-16 LAB — VITAMIN B12: Vitamin B-12: 511 pg/mL (ref 211–911)

## 2021-02-16 LAB — VITAMIN D 25 HYDROXY (VIT D DEFICIENCY, FRACTURES): VITD: 51.28 ng/mL (ref 30.00–100.00)

## 2021-02-16 LAB — PSA: PSA: 3.8 ng/mL (ref 0.10–4.00)

## 2021-02-16 LAB — HEMOGLOBIN A1C: Hgb A1c MFr Bld: 5.8 % (ref 4.6–6.5)

## 2021-02-16 MED ORDER — LINACLOTIDE 72 MCG PO CAPS: 72.0000 ug | ORAL_CAPSULE | Freq: Every day | ORAL | 1 refills | Status: DC

## 2021-02-16 NOTE — Progress Notes (Signed)
Established Patient Office Visit     This visit occurred during the SARS-CoV-2 public health emergency.  Safety protocols were in place, including screening questions prior to the visit, additional usage of staff PPE, and extensive cleaning of exam room while observing appropriate contact time as indicated for disinfecting solutions.    CC/Reason for Visit: Annual preventive exam and subsequent Medicare wellness visit  HPI: Richard Sparks is a 75 y.o. male who is coming in today for the above mentioned reasons. Past Medical History is significant for: Hypertension, hyperlipidemia, depression, chronic insomnia and constipation.  He has many concerns regarding his constipation.  He quit taking MiraLAX and Colace and feels like there is no difference with medication.  He has chronic insomnia and takes temazepam intermittently.  Has a leftover prescription for trazodone and is wondering if he may try this as well.  He is due for COVID booster, he had a colonoscopy in 2020.   Past Medical/Surgical History: Past Medical History:  Diagnosis Date   Allergy    BENIGN PROSTATIC HYPERTROPHY 08/01/2006   DEPRESSION 08/01/2006   HYPERLIPIDEMIA 08/01/2006   HYPERTENSION 08/01/2006    Past Surgical History:  Procedure Laterality Date   COLONOSCOPY  last 12/31/2013   NO PAST SURGERIES      Social History:  reports that he has never smoked. He has never used smokeless tobacco. He reports that he does not drink alcohol and does not use drugs.  Allergies: Allergies  Allergen Reactions   Amoxicillin Rash    Family History:  Family History  Problem Relation Age of Onset   Colon cancer Neg Hx    Esophageal cancer Neg Hx    Rectal cancer Neg Hx    Stomach cancer Neg Hx    Colon polyps Neg Hx      Current Outpatient Medications:    amLODipine (NORVASC) 10 MG tablet, Take 1 tablet (10 mg total) by mouth daily., Disp: 90 tablet, Rfl: 0   atorvastatin (LIPITOR) 40 MG tablet, Take 1 tablet  (40 mg total) by mouth daily., Disp: 90 tablet, Rfl: 0   b complex vitamins tablet, Take 1 tablet by mouth daily., Disp: , Rfl:    buPROPion (WELLBUTRIN XL) 150 MG 24 hr tablet, Take 1 tablet (150 mg total) by mouth daily., Disp: 90 tablet, Rfl: 0   Cholecalciferol (VITAMIN D) 1000 UNITS capsule, Take 1,000 Units by mouth daily., Disp: , Rfl:    fluticasone (FLONASE) 50 MCG/ACT nasal spray, Place 1 spray into both nostrils daily., Disp: 16 g, Rfl: 5   linaclotide (LINZESS) 72 MCG capsule, Take 1 capsule (72 mcg total) by mouth daily before breakfast., Disp: 90 capsule, Rfl: 1   loratadine (CLARITIN) 10 MG tablet, Take 10 mg by mouth daily., Disp: , Rfl:    Omega-3 Fatty Acids (FISH OIL) 1000 MG CAPS, Take 3 capsules by mouth daily., Disp: , Rfl:    oxymetazoline (AFRIN) 0.05 % nasal spray, Place 1 spray into both nostrils 2 (two) times daily as needed for congestion., Disp: , Rfl:    temazepam (RESTORIL) 15 MG capsule, TAKE 1 CAPSULE BY MOUTH AT BEDTIME AS NEEDED FOR SLEEP, Disp: 90 capsule, Rfl: 0   triamterene-hydrochlorothiazide (MAXZIDE-25) 37.5-25 MG tablet, Take 1 tablet by mouth every morning., Disp: 90 tablet, Rfl: 0   Turmeric 500 MG TABS, Take by mouth., Disp: , Rfl:   Review of Systems:  Constitutional: Denies fever, chills, diaphoresis, appetite change and fatigue.  HEENT: Denies photophobia, eye pain,  redness, hearing loss, ear pain, congestion, sore throat, rhinorrhea, sneezing, mouth sores, trouble swallowing, neck pain, neck stiffness and tinnitus.   Respiratory: Denies SOB, DOE, cough, chest tightness,  and wheezing.   Cardiovascular: Denies chest pain, palpitations and leg swelling.  Gastrointestinal: Denies nausea, vomiting, abdominal pain, diarrhea, Genitourinary: Denies dysuria, urgency, frequency, hematuria, flank pain and difficulty urinating.  Endocrine: Denies: hot or cold intolerance, sweats, changes in hair or nails, polyuria, polydipsia. Musculoskeletal: Denies  myalgias, back pain, joint swelling, arthralgias and gait problem.  Skin: Denies pallor, rash and wound.  Neurological: Denies dizziness, seizures, syncope, weakness, light-headedness, numbness and headaches.  Hematological: Denies adenopathy. Easy bruising, personal or family bleeding history  Psychiatric/Behavioral: Denies suicidal ideation, mood changes, confusion, nervousness and agitation    Physical Exam: Vitals:   02/16/21 0802  BP: 120/70  Pulse: (!) 59  Temp: 97.8 F (36.6 C)  TempSrc: Oral  SpO2: 99%  Weight: 181 lb 11.2 oz (82.4 kg)  Height: 5\' 11"  (1.803 m)    Body mass index is 25.34 kg/m.   Constitutional: NAD, calm, comfortable Eyes: PERRL, lids and conjunctivae normal ENMT: Mucous membranes are moist. Posterior pharynx clear of any exudate or lesions. Normal dentition. Tympanic membrane is pearly white, no erythema or bulging. Neck: normal, supple, no masses, no thyromegaly Respiratory: clear to auscultation bilaterally, no wheezing, no crackles. Normal respiratory effort. No accessory muscle use.  Cardiovascular: Regular rate and rhythm, no murmurs / rubs / gallops. No extremity edema. 2+ pedal pulses. No carotid bruits.  Abdomen: no tenderness, no masses palpated. No hepatosplenomegaly. Bowel sounds positive.  Musculoskeletal: no clubbing / cyanosis. No joint deformity upper and lower extremities. Good ROM, no contractures. Normal muscle tone.  Skin: no rashes, lesions, ulcers. No induration Neurologic: CN 2-12 grossly intact. Sensation intact, DTR normal. Strength 5/5 in all 4.  Psychiatric: Normal judgment and insight. Alert and oriented x 3. Normal mood.    Subsequent Medicare wellness visit   1. Risk factors, based on past  M,S,F -cardiovascular disease risk factors include age, gender, history of hypertension and hyperlipidemia   2.  Physical activities: Remains active through activities of daily living   3.  Depression/mood: History of depression,  mood appears stable   4.  Hearing: No perceived issues   5.  ADL's: Independent in all ADLs   6.  Fall risk: Low fall risk   7.  Home safety: No problems identified   8.  Height weight, and visual acuity: height and weight as above, vision:  Vision Screening   Right eye Left eye Both eyes  Without correction 20/32 20/25 20/25   With correction        9.  Counseling: Advised he update immunization status   10. Lab orders based on risk factors: Laboratory update will be reviewed   11. Referral : None today   12. Care plan: Follow-up with me in 6 months   13. Cognitive assessment: No cognitive impairment   14. Screening: Patient provided with a written and personalized 5-10 year screening schedule in the AVS. yes   15. Provider List Update: PCP only  16. Advance Directives: Full code   17. Opioids: Patient is not on any opioid prescriptions and has no risk factors for a substance use disorder.   [place] Office Visit from 02/16/2021 in [facility]  PHQ-9 Total Score 0       Fall Risk 11/20/2017 11/22/2018 02/13/2020 02/16/2021 02/16/2021  Falls in the past year? No 0 0  0 0  Was there an injury with Fall? - 0 0 0 0  Fall Risk Category Calculator - 0 0 0 0  Fall Risk Category - Low Low Low Low     Impression and Plan:  Encounter for preventive health examination -Recommend routine eye and dental care. -Immunizations: Due for COVID booster otherwise immunizations are up-to-date. -Healthy lifestyle discussed in detail. -Labs to be updated today. -Colon cancer screening: December/2020 -Breast cancer screening: Not applicable -Cervical cancer screening: Not applicable -Lung cancer screening: Not applicable -Prostate cancer screening: PSA -DEXA: Not applicable  Essential hypertension  -Well-controlled.  Depression, recurrent ([place]) -Mood is stable  Dyslipidemia  - Plan: Lipid panel  Insomnia, unspecified type -Okay to use  trazodone just not together with temazepam.  Constipation, unspecified constipation type  - Plan: linaclotide (LINZESS) 72 MCG capsule    Patient Instructions  -Nice seeing you today!!  -Lab work today; will notify you once results are available.  -Consider your COVID booster.  -Start Linzess 72 mg daily.  -Schedule follow up in 6 months.      [name], MD [facility] at [facility], [place]

## 2021-02-16 NOTE — Patient Instructions (Signed)
-  Nice seeing you today!!  -Lab work today; will notify you once results are available.  -Consider your COVID booster.  -Start Linzess 72 mg daily.  -Schedule follow up in 6 months.

## 2021-03-13 ENCOUNTER — Other Ambulatory Visit: Payer: Self-pay | Admitting: Internal Medicine

## 2021-03-13 DIAGNOSIS — I10 Essential (primary) hypertension: Secondary | ICD-10-CM

## 2021-03-24 ENCOUNTER — Other Ambulatory Visit: Payer: Self-pay | Admitting: Internal Medicine

## 2021-03-24 DIAGNOSIS — G4700 Insomnia, unspecified: Secondary | ICD-10-CM

## 2021-03-25 NOTE — Telephone Encounter (Signed)
Last refill per controlled substance database: 09/28/20 Last OV: 02/16/21 Next OV: 08/23/21

## 2021-04-08 ENCOUNTER — Telehealth: Payer: Self-pay | Admitting: Pharmacist

## 2021-04-08 NOTE — Chronic Care Management (AMB) (Signed)
Chronic Care Management Pharmacy Assistant   Name: Richard Sparks  MRN: [id number] DOB: 04-09-1945  Reason for Encounter: Disease State / Hypertension Assessment Call   Conditions to be addressed/monitored: HTN   Recent office visits: 02/16/2021 [name] MD - Patient was seen for Encounter for preventive health examination and additional issues. Started Linzess 75 mcg daily. Follow up in 6 months.  Recent consult visits:  None  Hospital visits:  None  Medications: Outpatient Encounter Medications as of 04/08/2021  Medication Sig   temazepam (RESTORIL) 15 MG capsule TAKE 1 CAPSULE BY MOUTH AT BEDTIME AS NEEDED FOR SLEEP   amLODipine (NORVASC) 10 MG tablet Take 1 tablet (10 mg total) by mouth daily.   atorvastatin (LIPITOR) 40 MG tablet Take 1 tablet (40 mg total) by mouth daily.   b complex vitamins tablet Take 1 tablet by mouth daily.   buPROPion (WELLBUTRIN XL) 150 MG 24 hr tablet Take 1 tablet (150 mg total) by mouth daily.   Cholecalciferol (VITAMIN D) 1000 UNITS capsule Take 1,000 Units by mouth daily.   fluticasone (FLONASE) 50 MCG/ACT nasal spray Place 1 spray into both nostrils daily.   linaclotide (LINZESS) 72 MCG capsule Take 1 capsule (72 mcg total) by mouth daily before breakfast.   loratadine (CLARITIN) 10 MG tablet Take 10 mg by mouth daily.   Omega-3 Fatty Acids (FISH OIL) 1000 MG CAPS Take 3 capsules by mouth daily.   oxymetazoline (AFRIN) 0.05 % nasal spray Place 1 spray into both nostrils 2 (two) times daily as needed for congestion.   triamterene-hydrochlorothiazide (MAXZIDE-25) 37.5-25 MG tablet Take 1 tablet by mouth every morning.   Turmeric 500 MG TABS Take by mouth.   No facility-administered encounter medications on file as of 04/08/2021.  Fill History: ATORVASTATIN CALCIUM  40 MG TABS 01/26/2021 90   TEMAZEPAM 15 MG CAPSULE 03/25/2021 90   TRIAMTERENE/HYDROCHLOROTHIAZIDE  37.5-25 MG TABS 01/26/2021 90   AMLODIPINE BESYLATE  10 MG  TABS 01/26/2021 90   BUPROPION HYDROCHLORIDE ER (XL)  150 MG TB24 01/26/2021 90   Reviewed chart prior to disease state call. Spoke with patient regarding BP  Recent Office Vitals: BP Readings from Last 3 Encounters:  02/16/21 120/70  08/13/20 130/80  05/29/20 130/80   Pulse Readings from Last 3 Encounters:  02/16/21 (!) 59  08/13/20 65  05/29/20 88    Wt Readings from Last 3 Encounters:  02/16/21 181 lb 11.2 oz (82.4 kg)  08/13/20 184 lb 4.8 oz (83.6 kg)  05/29/20 184 lb 12.8 oz (83.8 kg)     Kidney Function Lab Results  Component Value Date/Time   CREATININE 1.18 02/16/2021 08:59 AM   CREATININE 1.23 02/13/2020 07:42 AM   GFR 60.79 02/16/2021 08:59 AM   GFRNONAA 81 01/18/2008 08:23 AM   GFRAA 98 01/18/2008 08:23 AM    BMP Latest Ref Rng & Units 02/16/2021 02/13/2020 11/22/2018  Glucose 70 - 99 mg/dL 110(H) 103(H) 95  BUN 6 - 23 mg/dL 15 22 19   Creatinine 0.40 - 1.50 mg/dL 1.18 1.23 1.22  Sodium 135 - 145 mEq/L 141 142 140  Potassium 3.5 - 5.1 mEq/L 3.7 3.6 3.8  Chloride 96 - 112 mEq/L 104 103 103  CO2 19 - 32 mEq/L 29 31 29   Calcium 8.4 - 10.5 mg/dL 9.7 9.7 10.1    Current antihypertensive regimen:  Amlodipine 10 mg daily Maxzide 37.5/25 mg daily  How often are you checking your Blood Pressure? Patient states he will check his blood  pressure from time to time.  Current home BP readings: last readings were 143/90 on 03/24/21 and 151/82 on 03/17/2020, his machine reads 15 points to high.  What recent interventions/DTPs have been made by any provider to improve Blood Pressure control since last CPP Visit: No recent interventions   Any recent hospitalizations or ED visits since last visit with CPP? No recent hospital visits.   What diet changes have been made to improve Blood Pressure Control?  Patient eats a high fiber and low sodium diet Breakfast - patient will have cereal, hot cereal, toast, bagels. Lunch - patient will have eggs with bacon bits Dinner -  patient will have a meal with meat and vegetables and occasional meatless meal  What exercise is being done to improve your Blood Pressure Control?  Patient was doing yoga 3 days weekly, walks and lifts weights however, he had covid and hasn't gotten back into the habit yet.  Adherence Review: Is the patient currently on ACE/ARB medication? No Does the patient have >5 day gap between last estimated fill dates? No  Care Gaps: AWV - previous message sent to Ramond Craver Last BP - 120/70 on 02/16/2021 Covid booster - overdue  Star Rating Drugs: Atorvastatin 40 mg  - last filled 01/26/2021 90 DS at [facility] Pharmacist Assistant [phone]

## 2021-05-13 ENCOUNTER — Encounter: Payer: Self-pay | Admitting: Nurse Practitioner

## 2021-05-13 ENCOUNTER — Other Ambulatory Visit: Payer: Self-pay

## 2021-05-13 ENCOUNTER — Ambulatory Visit: Payer: Medicare Other | Admitting: Nurse Practitioner

## 2021-05-13 ENCOUNTER — Ambulatory Visit (INDEPENDENT_AMBULATORY_CARE_PROVIDER_SITE_OTHER)
Admission: RE | Admit: 2021-05-13 | Discharge: 2021-05-13 | Disposition: A | Discharge status: Home / Self Care | Payer: Medicare Other | Source: Ambulatory Visit | Attending: Nurse Practitioner | Admitting: Nurse Practitioner

## 2021-05-13 VITALS — BP 112/62 | HR 91 | Ht 71.0 in | Wt 181.2 lb

## 2021-05-13 DIAGNOSIS — R143 Flatulence: Secondary | ICD-10-CM

## 2021-05-13 DIAGNOSIS — I878 Other specified disorders of veins: Secondary | ICD-10-CM | POA: Diagnosis not present

## 2021-05-13 DIAGNOSIS — K5900 Constipation, unspecified: Secondary | ICD-10-CM

## 2021-05-13 DIAGNOSIS — M47816 Spondylosis without myelopathy or radiculopathy, lumbar region: Secondary | ICD-10-CM | POA: Diagnosis not present

## 2021-05-13 NOTE — Patient Instructions (Signed)
Your provider has requested that you have an Abdominal x ray before leaving today. Please go to the basement floor to our Radiology department for the test. ? ?RECOMMENDATIONS: ? ?Take Lactaid 1-2 tablets with each dairy product or use lactose free dairy products. ?Take a probiotic of your choice once a day. ?Gas-X twice a day. ?Follow up as needed. ? ?Thank you for trusting me with your gastrointestinal care!   ? ?Noralyn Pick, CRNP ? ? ? ?BMI: ? ?If you are age 90 or older, your body mass index should be between 23-30. Your Body mass index is 25.27 kg/m?Marland Kitchen If this is out of the aforementioned range listed, please consider follow up with your Primary Care Provider. ? ?If you are age 38 or younger, your body mass index should be between 19-25. Your Body mass index is 25.27 kg/m?Marland Kitchen If this is out of the aformentioned range listed, please consider follow up with your Primary Care Provider.  ? ?MY CHART: ? ?The [facility] providers would like to encourage you to use [facility] to communicate with providers for non-urgent requests or questions.  Due to long hold times on the telephone, sending your provider a message by [facility] may be a faster and more efficient way to get a response.  Please allow 48 business hours for a response.  Please remember that this is for non-urgent requests.  ? ?

## 2021-05-13 NOTE — Progress Notes (Signed)
? ? ? ?05/13/2021 ?Richard Sparks ?[id number] ?Jul 22, 1945 ? ? ?Chief Complaint:  Change in bowel pattern  ? ?History of Present Illness: Richard Sparks is a 76 year old male with a past medical history of hypertension, hyperlipidemia, BPH and colon polyps. He presents today for further evaluation regarding a change in his bowel pattern.  He previously passed a fairly normal firm brown bowel movement daily.  However, over the past year he intermittently has increased urgency to have a bowel movement and passes only gas with nonbloody mucus per the rectum.  He passes a soft stool which follows a part in the toilet water following a few episodes of passing mucus per the rectum.  No rectal bleeding or melena.  No abdominal or rectal pain.  He was evaluated by his PCP who prescribed Linzess for constipation but he never filled this prescription as he does not like to take medications.  He took MiraLAX for at least 2 consecutive weeks which he felt was ineffective so he stopped taking it.  He is taking Metamucil 1 tablespoon daily.  He eats a high-fiber diet.  He drinks at least 6 glasses of water daily.  He eats yogurt and prunes with probiotics.  He drinks 7 glass of milk most mornings.  He drinks diet Pepsi 1 to 2 cans most days.  He had diarrhea for 1 day several months ago which resolved after he took Imodium. No weight loss.  No fever, sweats or chills.  He is concerned about the possibility of having a colon blockage.  He has a history of tubular adenomatous polyps per colonoscopy 12/2013.  His most recent colonoscopy by Dr. Henrene Pastor was 02/22/2019 which identified one 5 mm polyp which was removed from the transverse colon and internal hemorrhoids.  Biopsies of the polyp showed plant matter.  A repeat colonoscopy in 5 years was to be considered if medically appropriate.  He does not wish to pursue any further colon polyp surveillance colonoscopies. ? ?CBC Latest Ref Rng & Units 02/16/2021 02/13/2020 11/22/2018  ?WBC 4.0 -  10.5 K/uL 8.0 7.5 7.6  ?Hemoglobin 13.0 - 17.0 g/dL 16.0 15.9 17.3(H)  ?Hematocrit 39.0 - 52.0 % 45.5 46.3 49.1  ?Platelets 150.0 - 400.0 K/uL 221.0 237.0 234.0  ?  ?CMP Latest Ref Rng & Units 02/16/2021 02/13/2020 11/22/2018  ?Glucose 70 - 99 mg/dL 110(H) 103(H) 95  ?BUN 6 - 23 mg/dL '15 22 19  '$ ?Creatinine 0.40 - 1.50 mg/dL 1.18 1.23 1.22  ?Sodium 135 - 145 mEq/L 141 142 140  ?Potassium 3.5 - 5.1 mEq/L 3.7 3.6 3.8  ?Chloride 96 - 112 mEq/L 104 103 103  ?CO2 19 - 32 mEq/L '29 31 29  '$ ?Calcium 8.4 - 10.5 mg/dL 9.7 9.7 10.1  ?Total Protein 6.0 - 8.3 g/dL 6.4 6.7 6.7  ?Total Bilirubin 0.2 - 1.2 mg/dL 1.1 0.9 0.9  ?Alkaline Phos 39 - 117 U/L 71 56 59  ?AST 0 - 37 U/L '15 19 20  '$ ?ALT 0 - 53 U/L '21 24 23  '$ ?  ?Colonoscopy 02/22/2019 by Dr. Henrene Pastor:  ?One 5 mm polyp in the transverse colon, removed with a cold snare. Resected and ?retrieved. ?- Internal hemorrhoids. ?- The examination was otherwise normal on direct and retroflexion views. ?- 5 year colonoscopy recall ?Surgical [P], colon, transverse, polyp ?- PLANT MATTER CONSISTENT WITH SEED. ?- NO COLONIC MUCOSA, ADENOMATOUS CHANGE OR EVIDENCE OF MALIGNANCY. ? ?Colonoscopy 12/31/2013: ?1. Two polyps measuring 3 mm in size were found in the ascending  colon; polypectomy was performed with a cold snare ?2. The examination was otherwise normal ?- TUBULAR ADENOMA (2). NO HIGH GRADE DYSPLASIA OR MALIGNANCY IDENTIFIED. ? ?Colonoscopy 02/16/2004: ?Normal colonoscopy  ? ?Past Medical History:  ?Diagnosis Date  ? Allergy   ? BENIGN PROSTATIC HYPERTROPHY 08/01/2006  ? DEPRESSION 08/01/2006  ? HYPERLIPIDEMIA 08/01/2006  ? HYPERTENSION 08/01/2006  ? ?Past Surgical History:  ?Procedure Laterality Date  ? COLONOSCOPY  last 12/31/2013  ? NO PAST SURGERIES    ? ?Current Outpatient Medications on File Prior to Visit  ?Medication Sig Dispense Refill  ? amLODipine (NORVASC) 10 MG tablet Take 1 tablet (10 mg total) by mouth daily. 90 tablet 1  ? atorvastatin (LIPITOR) 40 MG tablet Take 1 tablet (40 mg  total) by mouth daily. 90 tablet 1  ? b complex vitamins tablet Take 1 tablet by mouth daily.    ? buPROPion (WELLBUTRIN XL) 150 MG 24 hr tablet Take 1 tablet (150 mg total) by mouth daily. 90 tablet 1  ? Cholecalciferol (VITAMIN D) 1000 UNITS capsule Take 1,000 Units by mouth daily.    ? fluticasone (FLONASE) 50 MCG/ACT nasal spray Place 1 spray into both nostrils daily. 16 g 5  ? loratadine (CLARITIN) 10 MG tablet Take 10 mg by mouth daily.    ? Omega-3 Fatty Acids (FISH OIL) 1000 MG CAPS Take 4 capsules by mouth daily.    ? oxymetazoline (AFRIN) 0.05 % nasal spray Place 1 spray into both nostrils 2 (two) times daily as needed for congestion.    ? temazepam (RESTORIL) 15 MG capsule TAKE 1 CAPSULE BY MOUTH AT BEDTIME AS NEEDED FOR SLEEP 90 capsule 0  ? triamterene-hydrochlorothiazide (MAXZIDE-25) 37.5-25 MG tablet Take 1 tablet by mouth every morning. 90 tablet 1  ? Turmeric 500 MG TABS Take by mouth.    ? linaclotide (LINZESS) 72 MCG capsule Take 1 capsule (72 mcg total) by mouth daily before breakfast. (Patient not taking: Reported on 05/13/2021) 90 capsule 1  ? ?No current facility-administered medications on file prior to visit.  ? ?Allergies  ?Allergen Reactions  ? Amoxicillin Rash  ? ?Current Medications, Allergies, Past Medical History, Past Surgical History, Family History and Social History were reviewed in [employer] record. ? ?Review of Systems:   ?Constitutional: Negative for fever, sweats, chills or weight loss.  ?Respiratory: Negative for shortness of breath.   ?Cardiovascular: Negative for chest pain, palpitations and leg swelling.  ?Gastrointestinal: See HPI.  ?Musculoskeletal: Negative for back pain or muscle aches.  ?Neurological: Negative for dizziness, headaches or paresthesias.  ? ?Physical Exam: ?BP 112/62   Pulse 91   Ht '5\' 11"'$  (1.803 m)   Wt 181 lb 3.2 oz (82.2 kg)   SpO2 98%   BMI 25.27 kg/m?  ? ?Wt Readings from Last 3 Encounters:  ?05/13/21 181 lb 3.2 oz (82.2  kg)  ?02/16/21 181 lb 11.2 oz (82.4 kg)  ?08/13/20 184 lb 4.8 oz (83.6 kg)  ?  ?General: 76 year old male in no acute distress. ?Head: Normocephalic and atraumatic. ?Eyes: No scleral icterus. Conjunctiva pink . ?Ears: Normal auditory acuity. ?Mouth: Dentition intact. No ulcers or lesions.  ?Lungs: Clear throughout to auscultation. ?Heart: Regular rate and rhythm, no murmur. ?Abdomen: Soft, nontender and nondistended. No masses or hepatomegaly. Normal bowel sounds x 4 quadrants.  ?Rectal: Deferred. ?Musculoskeletal: Symmetrical with no gross deformities. ?Extremities: No edema. ?Neurological: Alert oriented x 4. No focal deficits.  ?Psychological: Alert and cooperative. Normal mood and affect ? ?Assessment and Recommendations: ? ?  50) 76 year old male altered bowel pattern, he is passing gas and nonbloody mucus per the rectum followed by softer brown stools x 1 year. No rectal bleeding. ?-Probiotic of choice once daily ?-Abdominal x-ray today to rule out stool-filled colon ?-Further bowel regimen recommendations to be determined after x-ray results reviewed ?-Avoid dairy products or take Lactaid 1-2 tabs with each dairy product ?-Gas-X 1 tab twice daily ? ?2) History of tubular adenomatous colon polyps per colonoscopy 12/2013.  Colonoscopy 02/2019 identified one 5 mm polyp which was removed from the transverse colon, biopsies were consistent with plant matter. ?-Mr. Musson does not wish to pursue any further colon polyp surveillance colonoscopies ? ?Today's encounter was 25 minutes which included precharting, chart/result review, history/exam, face-to-face time used for counseling, formulating a treatment plan and documentation. ? ?

## 2021-05-13 NOTE — Progress Notes (Signed)
Noted  

## 2021-07-06 ENCOUNTER — Telehealth: Payer: Self-pay | Admitting: Pharmacist

## 2021-07-06 NOTE — Chronic Care Management (AMB) (Signed)
Chronic Care Management Pharmacy Assistant   Name: Richard Sparks  MRN: [id number] DOB: 1945/11/08  Reason for Encounter: Disease State / Hypertension Assessment Call   Conditions to be addressed/monitored: HTN  Recent office visits:  None  Recent consult visits:  05/13/2021 [name] NP (GI) - Patient was seen for Constipation, unspecified constipation type and an additional issue.  Return if symptoms worsen or fail to improve  Hospital visits:  None  Medications: Outpatient Encounter Medications as of 07/06/2021  Medication Sig   amLODipine (NORVASC) 10 MG tablet Take 1 tablet (10 mg total) by mouth daily.   atorvastatin (LIPITOR) 40 MG tablet Take 1 tablet (40 mg total) by mouth daily.   b complex vitamins tablet Take 1 tablet by mouth daily.   buPROPion (WELLBUTRIN XL) 150 MG 24 hr tablet Take 1 tablet (150 mg total) by mouth daily.   Cholecalciferol (VITAMIN D) 1000 UNITS capsule Take 1,000 Units by mouth daily.   fluticasone (FLONASE) 50 MCG/ACT nasal spray Place 1 spray into both nostrils daily.   linaclotide (LINZESS) 72 MCG capsule Take 1 capsule (72 mcg total) by mouth daily before breakfast. (Patient not taking: Reported on 05/13/2021)   loratadine (CLARITIN) 10 MG tablet Take 10 mg by mouth daily.   Omega-3 Fatty Acids (FISH OIL) 1000 MG CAPS Take 4 capsules by mouth daily.   oxymetazoline (AFRIN) 0.05 % nasal spray Place 1 spray into both nostrils 2 (two) times daily as needed for congestion.   temazepam (RESTORIL) 15 MG capsule TAKE 1 CAPSULE BY MOUTH AT BEDTIME AS NEEDED FOR SLEEP   triamterene-hydrochlorothiazide (MAXZIDE-25) 37.5-25 MG tablet Take 1 tablet by mouth every morning.   Turmeric 500 MG TABS Take by mouth.   No facility-administered encounter medications on file as of 07/06/2021.  Fill History: ATORVASTATIN CALCIUM  40 MG TABS 05/17/2021 100   TEMAZEPAM 15 MG CAPSULE 03/25/2021 90   TRIAMTERENE/HYDROCHLOROTHIAZIDE  37.5-25 MG TABS  05/17/2021 100   AMLODIPINE BESYLATE  10 MG TABS 05/17/2021 100   BUPROPION HYDROCHLORIDE ER (XL)  150 MG TB24 05/17/2021 90   Reviewed chart prior to disease state call. Spoke with patient regarding BP  Recent Office Vitals: BP Readings from Last 3 Encounters:  05/13/21 112/62  02/16/21 120/70  08/13/20 130/80   Pulse Readings from Last 3 Encounters:  05/13/21 91  02/16/21 (!) 59  08/13/20 65    Wt Readings from Last 3 Encounters:  05/13/21 181 lb 3.2 oz (82.2 kg)  02/16/21 181 lb 11.2 oz (82.4 kg)  08/13/20 184 lb 4.8 oz (83.6 kg)     Kidney Function Lab Results  Component Value Date/Time   CREATININE 1.18 02/16/2021 08:59 AM   CREATININE 1.23 02/13/2020 07:42 AM   GFR 60.79 02/16/2021 08:59 AM   GFRNONAA 81 01/18/2008 08:23 AM   GFRAA 98 01/18/2008 08:23 AM       Latest Ref Rng & Units 02/16/2021    8:59 AM 02/13/2020    7:42 AM 11/22/2018   10:26 AM  BMP  Glucose 70 - 99 mg/dL 110   103   95    BUN 6 - 23 mg/dL '15   22   19    '$ Creatinine 0.40 - 1.50 mg/dL 1.18   1.23   1.22    Sodium 135 - 145 mEq/L 141   142   140    Potassium 3.5 - 5.1 mEq/L 3.7   3.6   3.8    Chloride 96 - 112  mEq/L 104   103   103    CO2 19 - 32 mEq/L '29   31   29    '$ Calcium 8.4 - 10.5 mg/dL 9.7   9.7   10.1      Current antihypertensive regimen:  Amlodipine 10 mg daily Maxzide 37.5/25 mg daily  How often are you checking your Blood Pressure? Patient is checking infrequently.  He did check last week.  He has his monitor setting out and just forgets to check it. He was reminded to try and check it weekly.  Current home BP readings: His reading last week was 142/78.  He checked his BP while on the phone and it was 143/79. He states his cuff tends to read a little high.   What recent interventions/DTPs have been made by any provider to improve Blood Pressure control since last CPP Visit: No recent interventions.   Any recent hospitalizations or ED visits since last visit with CPP? No  recent hospital visits.   What diet changes have been made to improve Blood Pressure Control?  Patient eats a high fiber and low sodium diet Breakfast - patient will have eggs, cereal, hot cereal, toast, bagels. Lunch - patient will have a sandwich of some sort.  Dinner - patient will have a meal with meat, vegetables and starch  What exercise is being done to improve your Blood Pressure Control?  Patient will go to a park and walk on nice days or up and down the stairs in his home.   Adherence Review: Is the patient currently on ACE/ARB medication? Yes Does the patient have >5 day gap between last estimated fill dates? No  Care Gaps: AWV - previous message sent to Ramond Craver Last BP - 112/62 on 05/13/2021 Covid booster - overdue  Star Rating Drugs: Atorvastatin 40 mg  - last filled 05/17/2021 90 DS at [facility] Pharmacist Assistant [phone]

## 2021-07-25 ENCOUNTER — Other Ambulatory Visit: Payer: Self-pay | Admitting: Internal Medicine

## 2021-07-25 DIAGNOSIS — I10 Essential (primary) hypertension: Secondary | ICD-10-CM

## 2021-08-23 ENCOUNTER — Ambulatory Visit: Payer: Medicare Other | Admitting: Internal Medicine

## 2021-08-31 ENCOUNTER — Ambulatory Visit (INDEPENDENT_AMBULATORY_CARE_PROVIDER_SITE_OTHER): Payer: Medicare Other | Admitting: Internal Medicine

## 2021-08-31 VITALS — BP 130/70 | HR 70 | Temp 98.1°F | Wt 185.6 lb

## 2021-08-31 DIAGNOSIS — G4700 Insomnia, unspecified: Secondary | ICD-10-CM | POA: Diagnosis not present

## 2021-08-31 DIAGNOSIS — E785 Hyperlipidemia, unspecified: Secondary | ICD-10-CM

## 2021-08-31 DIAGNOSIS — I10 Essential (primary) hypertension: Secondary | ICD-10-CM | POA: Diagnosis not present

## 2021-08-31 DIAGNOSIS — K5900 Constipation, unspecified: Secondary | ICD-10-CM

## 2021-10-29 ENCOUNTER — Telehealth: Payer: Self-pay | Admitting: Pharmacist

## 2021-10-29 NOTE — Chronic Care Management (AMB) (Signed)
    Chronic Care Management Pharmacy Assistant   Name: [name]  MRN: [id number] DOB: 03-26-1945  11/01/2021 APPOINTMENT REMINDER  Richard Sparks was reminded to have all medications, supplements and any blood glucose and blood pressure readings available for review with Richard Sparks, Pharm. D, at his telephone visit on 11/01/2021 at 11:30.  Care Gaps: AWV - previous message sent to Richard Sparks Last BP - 130/70 on 08/31/2021 Covid booster - overdue Flu - due  Star Rating Drug: Atorvastatin 40 mg  - last filled 08/23/2021 100 DS at [facility]  Any gaps in medications fill history? No   Richard Sparks [facility]  Catering manager [phone]

## 2021-10-29 NOTE — Progress Notes (Deleted)
Chronic Care Management Pharmacy Note  10/29/2021 Name:  Richard Sparks MRN:  [id number] DOB:  1945/04/20  Summary: BP at goal < 140/90 LDL not at goal < 100   Recommendations/Changes made from today's visit: -Recommended bringing BP cuff into office visit to ensure accuracy -Consider trial of Senna for further assistance with motility -Requested gastroenterology referral   Plan: BP and HLD assessment in 3 months  Subjective: Richard Sparks is an 76 y.o. year old male who is a primary patient of Richard Sparks, Richard Halsted, MD.  The CCM team was consulted for assistance with disease management and care coordination needs.    Engaged with patient by telephone for follow up visit in response to provider referral for pharmacy case management and/or care coordination services.   Consent to Services:  The patient was given information about Chronic Care Management services, agreed to services, and gave verbal consent prior to initiation of services.  Please see initial visit note for detailed documentation.   Patient Care Team: Richard Sparks, Richard Halsted, MD as PCP - General (Internal Medicine) Richard Sparks, [facility] as Pharmacist (Pharmacist)  Recent office visits: 08/31/21 Richard Frohlich, MD: Patient presented for follow up for chronic conditions. Patient will trial natural supplement including melatonin, valerian root and L-tryptophan. Follow up in 6 months for CPE.  02/16/2021 Richard Frohlich MD - Patient was seen for Encounter for preventive health examination and additional concerns. Started Linzess 75 mcg daily. Follow up in 6 months.  Recent consult visits: 05/13/2021 Richard Best NP (GI) - Patient was seen for Constipation, unspecified constipation type and an additional issue. Return if symptoms worsen or fail to improve  Hospital visits: None in previous 6 months  Objective:  Lab Results  Component Value Date   CREATININE 1.18 02/16/2021   BUN  15 02/16/2021   GFR 60.79 02/16/2021   GFRNONAA 81 01/18/2008   GFRAA 98 01/18/2008   NA 141 02/16/2021   K 3.7 02/16/2021   CALCIUM 9.7 02/16/2021   CO2 29 02/16/2021   GLUCOSE 110 (H) 02/16/2021    Lab Results  Component Value Date/Time   HGBA1C 5.8 02/16/2021 08:59 AM   HGBA1C 5.6 02/13/2020 07:42 AM   GFR 60.79 02/16/2021 08:59 AM   GFR 58.25 (L) 02/13/2020 07:42 AM    Last diabetic Eye exam: No results found for: "HMDIABEYEEXA"  Last diabetic Foot exam: No results found for: "HMDIABFOOTEX"   Lab Results  Component Value Date   CHOL 147 02/16/2021   HDL 45.00 02/16/2021   LDLCALC 76 02/16/2021   LDLDIRECT 112.0 08/13/2020   TRIG 128.0 02/16/2021   CHOLHDL 3 02/16/2021       Latest Ref Rng & Units 02/16/2021    8:59 AM 02/13/2020    7:42 AM 11/22/2018   10:26 AM  Hepatic Function  Total Protein 6.0 - 8.3 g/dL 6.4  6.7  6.7   Albumin 3.5 - 5.2 g/dL 4.3  4.4  4.4   AST 0 - 37 U/L _0 ALT 0 - 53 U/L _1 Alk Phosphatase 39 - 117 U/L 71  56  59   Total Bilirubin 0.2 - 1.2 mg/dL 1.1  0.9  0.9     Lab Results  Component Value Date/Time   TSH 1.34 02/16/2021 08:59 AM   TSH 1.91 02/13/2020 07:42 AM       Latest Ref Rng & Units 02/16/2021    8:59  AM 02/13/2020    7:42 AM 11/22/2018   10:26 AM  CBC  WBC 4.0 - 10.5 K/uL 8.0  7.5  7.6   Hemoglobin 13.0 - 17.0 g/dL 16.0  15.9  17.3   Hematocrit 39.0 - 52.0 % 45.5  46.3  49.1   Platelets 150.0 - 400.0 K/uL 221.0  237.0  234.0     Lab Results  Component Value Date/Time   VD25OH 51.28 02/16/2021 08:59 AM   VD25OH 44.50 02/13/2020 07:42 AM    Clinical ASCVD: No  The 10-year ASCVD risk score (Arnett DK, et al., 2019) is: 27.4%   Values used to calculate the score:     Age: 10 years     Sex: Male     Is Non-Hispanic African American: No     Diabetic: No     Tobacco smoker: No     Systolic Blood Pressure: 706 mmHg     Is BP treated: Yes     HDL Cholesterol: 45 mg/dL     Total Cholesterol:  147 mg/dL       02/16/2021    8:02 AM 02/16/2021    8:01 AM 05/29/2020    9:56 AM  Depression screen PHQ 2/9  Decreased Interest 0 0 1  Down, Depressed, Hopeless 0 0 3  PHQ - 2 Score 0 0 4  Altered sleeping 0 0 2  Tired, decreased energy 0 0 1  Change in appetite 0 0 0  Feeling bad or failure about yourself  0 0 1  Trouble concentrating 0 0 1  Moving slowly or fidgety/restless 0 0 0  Suicidal thoughts 0 0 0  PHQ-9 Score 0 0 9  Difficult doing work/chores Not difficult at all Not difficult at all Not difficult at all      Social History   Tobacco Use  Smoking Status Never  Smokeless Tobacco Never   BP Readings from Last 3 Encounters:  08/31/21 130/70  05/13/21 112/62  02/16/21 120/70   Pulse Readings from Last 3 Encounters:  08/31/21 70  05/13/21 91  02/16/21 (!) 59   Wt Readings from Last 3 Encounters:  08/31/21 185 lb 9.6 oz (84.2 kg)  05/13/21 181 lb 3.2 oz (82.2 kg)  02/16/21 181 lb 11.2 oz (82.4 kg)   BMI Readings from Last 3 Encounters:  08/31/21 25.89 kg/m  05/13/21 25.27 kg/m  02/16/21 25.34 kg/m    Assessment/Interventions: Review of patient past medical history, allergies, medications, health status, including review of consultants reports, laboratory and other test data, was performed as part of comprehensive evaluation and provision of chronic care management services.   SDOH:  (Social Determinants of Health) assessments and interventions performed: No  SDOH Screenings   Alcohol Screen: Not on file  Depression (PHQ2-9): Low Risk  (02/16/2021)   Depression (PHQ2-9)    PHQ-2 Score: 0  Financial Resource Strain: Not on file  Food Insecurity: Not on file  Housing: Not on file  Physical Activity: Not on file  Social Connections: Not on file  Stress: Not on file  Tobacco Use: Low Risk  (05/13/2021)   Patient History    Smoking Tobacco Use: Never    Smokeless Tobacco Use: Never    Passive Exposure: Not on file  Transportation Needs: Not on  file    [place]  Allergies  Allergen Reactions   Amoxicillin Rash    Medications Reviewed Today     Reviewed by Richard Sparks, Richard Halsted, MD (Physician) on 08/31/21 at [phone]  Med List Status: <None>   Medication Order Taking? Sig Documenting Provider Last Dose Status Informant  amLODipine (NORVASC) 10 MG tablet [id number] Yes TAKE 1 TABLET BY MOUTH DAILY Richard Sparks, Richard Halsted, MD Taking Active   atorvastatin (LIPITOR) 40 MG tablet [id number] Yes TAKE 1 TABLET BY MOUTH DAILY Richard Sparks, Richard Halsted, MD Taking Active   b complex vitamins tablet [id number] Yes Take 1 tablet by mouth daily. [provider] Taking Active   buPROPion (WELLBUTRIN XL) 150 MG 24 hr tablet [id number] Yes Take 1 tablet (150 mg total) by mouth daily. Richard Sparks, Richard Halsted, MD Taking Active   Cholecalciferol (VITAMIN D) 1000 UNITS capsule [id number] Yes Take 1,000 Units by mouth daily. [provider] Taking Active   fluticasone (FLONASE) 50 MCG/ACT nasal spray [id number] Yes Place 1 spray into both nostrils daily. Marletta Lor, MD Taking Active   Lactase 9000 units TABS [id number] Yes Take by mouth. [provider] Taking Active   linaclotide Rolan Lipa) 72 MCG capsule [id number] Yes Take 1 capsule (72 mcg total) by mouth daily before breakfast. Richard Sparks, Richard Halsted, MD Taking Active   loratadine (CLARITIN) 10 MG tablet [id number] Yes Take 10 mg by mouth daily. [provider] Taking Active   Omega-3 Fatty Acids (FISH OIL) 1000 MG CAPS [id number] Yes Take 4 capsules by mouth daily. [provider] Taking Active   oxymetazoline (AFRIN) 0.05 % nasal spray [id number] Yes Place 1 spray into both nostrils 2 (two) times daily as needed for congestion. [provider] Taking Active   Probiotic Product (PROBIO DEFENSE PO) [id number] Yes Take by mouth. [provider] Taking Active   Simethicone 180 MG CAPS [id number] Yes Take by mouth. [provider] Taking Active   temazepam (RESTORIL) 15 MG capsule [id number] Yes TAKE 1 CAPSULE BY MOUTH AT BEDTIME AS NEEDED FOR SLEEP Richard Sparks, Richard Halsted, MD Taking Active   triamterene-hydrochlorothiazide [facility]) 37.5-25 MG tablet [id number] Yes TAKE 1 TABLET BY MOUTH IN THE  MORNING Richard Sparks, Richard Halsted, MD Taking Active   Turmeric 500 MG TABS [id number] Yes Take by mouth. [provider] Taking Active             Patient Active Problem List   Diagnosis Date Noted   History of colonic polyps 11/05/2014   Dyslipidemia 08/01/2006   Depression, recurrent ([place]) 08/01/2006   Essential hypertension 08/01/2006   BPH (benign prostatic hyperplasia) 08/01/2006    Immunization History  Administered Date(s) Administered   Influenza, High Dose Seasonal PF 01/06/2020   Influenza,inj,Quad PF,6+ Mos 11/05/2014   Influenza-Unspecified 12/05/2020   Janssen (J&J) SARS-COV-2 Vaccination 06/14/2019   Pneumococcal Conjugate-13 04/26/2013   Pneumococcal Polysaccharide-23 10/25/2011   Tdap 09/07/2010, 12/05/2020   Zoster Recombinat (Shingrix) 02/07/2020, 05/12/2020, 07/12/2020   Zoster, Live 10/25/2012   Patient reports it has been a tough week as he had to put down his dog on Monday. 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  Patient is also still having ongoing issues with regular bowel movements. He stopped taking Miralax and did not notice any difference in his bowel movements. He is still using metamucil every day.  -CCS?  Conditions to be addressed/monitored:  Hypertension, Hyperlipidemia, Depression, BPH, Allergic Rhinitis and Insomnia  Conditions addressed this visit: Hypertension, hyperlipidemia  There are no care plans that you recently modified to display for this patient.      Medication Assistance: None required.  Patient affirms current coverage meets needs.  Compliance/Adherence/Medication fill history:  Care Gaps: Tetanus, COVID  booster, influenza Last BP - 130/80 on 08/13/2020   Star-Rating Drugs: Atorvastatin 26m - [facility] [place]/20 [place] Patient's [facility]:  [facility] [place] [place]Phone: [phone]: [phone] [facility] ([address]Phone: [phone]: [phone] [facility] ([facility]) - [place] [address]Phone: [phone]: [phone]  Uses pill box? No - puts medicines on a plastic lid in 3 batches; uses a pillbox when he is not going to be home for lunch Pt endorses 100% compliance  We discussed: Current pharmacy is preferred with insurance plan and patient is satisfied with pharmacy services Patient decided to: Continue current medication management strategy  Care Plan and Follow Up Patient Decision:  Patient agrees to Care Plan and Follow-up.  Plan: The care management team will reach out to the patient again over the next 30 days.  MJeni Salles PharmD BBarnes-Kasson County HospitalClinical Pharmacist LRheaat [place]

## 2021-11-01 ENCOUNTER — Telehealth: Payer: Medicare Other

## 2021-11-01 ENCOUNTER — Telehealth: Payer: Self-pay | Admitting: *Deleted

## 2021-11-01 DIAGNOSIS — N400 Enlarged prostate without lower urinary tract symptoms: Secondary | ICD-10-CM

## 2021-11-01 DIAGNOSIS — E785 Hyperlipidemia, unspecified: Secondary | ICD-10-CM

## 2021-11-01 DIAGNOSIS — I10 Essential (primary) hypertension: Secondary | ICD-10-CM

## 2021-11-01 NOTE — Telephone Encounter (Signed)
Referral placed.

## 2021-11-01 NOTE — Telephone Encounter (Signed)
-----   Message from [name], [facility] sent at 10/29/2021  4:15 PM EDT ----- Regarding: CCM referral Hi,  Can you please place a pharmacy CCM referral for Mr. Schertzer?  Thanks! Maddie

## 2021-11-05 ENCOUNTER — Ambulatory Visit (INDEPENDENT_AMBULATORY_CARE_PROVIDER_SITE_OTHER): Payer: Medicare Other | Admitting: Pharmacist

## 2021-11-05 DIAGNOSIS — F339 Major depressive disorder, recurrent, unspecified: Secondary | ICD-10-CM

## 2021-11-05 DIAGNOSIS — K5900 Constipation, unspecified: Secondary | ICD-10-CM

## 2021-11-05 DIAGNOSIS — I10 Essential (primary) hypertension: Secondary | ICD-10-CM

## 2021-11-05 DIAGNOSIS — G4700 Insomnia, unspecified: Secondary | ICD-10-CM

## 2021-11-05 NOTE — Progress Notes (Signed)
Chronic Care Management Pharmacy Note  11/09/2021 Name:  Richard Sparks MRN:  [id number] DOB:  1945-10-20  Summary: BP at goal < 140/90 per office readings but not home readings as home cuff is not accurate Pt inquired about tapering off bupropion   Recommendations/Changes made from today's visit: -Recommended purchasing a new BP cuff -Recommend tapering Wellbutrin to every other day for 1-2 weeks then stop per patient request   Plan: Mail PAP for Linzess BP and HLD assessment in 6 months Follow up in 1 year  Subjective: Richard Sparks is an 76 y.o. year old male who is a primary patient of [name], [name], MD.  The CCM team was consulted for assistance with disease management and care coordination needs.    Engaged with patient by telephone for follow up visit in response to provider referral for pharmacy case management and/or care coordination services.   Consent to Services:  The patient was given information about Chronic Care Management services, agreed to services, and gave verbal consent prior to initiation of services.  Please see initial visit note for detailed documentation.   Patient Care Team: [name], [name], MD as PCP - General (Internal Medicine) [name], [facility] as Pharmacist (Pharmacist)  Recent office visits: 08/31/21 Richard Frohlich, MD: Patient presented for follow up for chronic conditions. Patient will trial natural supplement including melatonin, valerian root and L-tryptophan. Follow up in 6 months for CPE.  02/16/2021 Richard Frohlich MD - Patient was seen for Encounter for preventive health examination and additional concerns. Started Linzess 75 mcg daily. Follow up in 6 months.  Recent consult visits: 05/13/2021 [name] NP (GI) - Patient was seen for Constipation, unspecified constipation type and an additional issue. Return if symptoms worsen or fail to improve  Hospital visits: None in previous  6 months  Objective:  Lab Results  Component Value Date   CREATININE 1.18 02/16/2021   BUN 15 02/16/2021   GFR 60.79 02/16/2021   GFRNONAA 81 01/18/2008   GFRAA 98 01/18/2008   NA 141 02/16/2021   K 3.7 02/16/2021   CALCIUM 9.7 02/16/2021   CO2 29 02/16/2021   GLUCOSE 110 (H) 02/16/2021    Lab Results  Component Value Date/Time   HGBA1C 5.8 02/16/2021 08:59 AM   HGBA1C 5.6 02/13/2020 07:42 AM   GFR 60.79 02/16/2021 08:59 AM   GFR 58.25 (L) 02/13/2020 07:42 AM    Last diabetic Eye exam: No results found for: "HMDIABEYEEXA"  Last diabetic Foot exam: No results found for: "HMDIABFOOTEX"   Lab Results  Component Value Date   CHOL 147 02/16/2021   HDL 45.00 02/16/2021   LDLCALC 76 02/16/2021   LDLDIRECT 112.0 08/13/2020   TRIG 128.0 02/16/2021   CHOLHDL 3 02/16/2021       Latest Ref Rng & Units 02/16/2021    8:59 AM 02/13/2020    7:42 AM 11/22/2018   10:26 AM  Hepatic Function  Total Protein 6.0 - 8.3 g/dL 6.4  6.7  6.7   Albumin 3.5 - 5.2 g/dL 4.3  4.4  4.4   AST 0 - 37 U/L _0 ALT 0 - 53 U/L _1 Alk Phosphatase 39 - 117 U/L 71  56  59   Total Bilirubin 0.2 - 1.2 mg/dL 1.1  0.9  0.9     Lab Results  Component Value Date/Time   TSH 1.34 02/16/2021 08:59 AM  TSH 1.91 02/13/2020 07:42 AM       Latest Ref Rng & Units 02/16/2021    8:59 AM 02/13/2020    7:42 AM 11/22/2018   10:26 AM  CBC  WBC 4.0 - 10.5 K/uL 8.0  7.5  7.6   Hemoglobin 13.0 - 17.0 g/dL 16.0  15.9  17.3   Hematocrit 39.0 - 52.0 % 45.5  46.3  49.1   Platelets 150.0 - 400.0 K/uL 221.0  237.0  234.0     Lab Results  Component Value Date/Time   VD25OH 51.28 02/16/2021 08:59 AM   VD25OH 44.50 02/13/2020 07:42 AM    Clinical ASCVD: No  The 10-year ASCVD risk score (Arnett DK, et al., 2019) is: 27.4%   Values used to calculate the score:     Age: 86 years     Sex: Male     Is Non-Hispanic African American: No     Diabetic: No     Tobacco smoker: No     Systolic Blood  Pressure: 130 mmHg     Is BP treated: Yes     HDL Cholesterol: 45 mg/dL     Total Cholesterol: 147 mg/dL       02/16/2021    8:02 AM 02/16/2021    8:01 AM 05/29/2020    9:56 AM  Depression screen PHQ 2/9  Decreased Interest 0 0 1  Down, Depressed, Hopeless 0 0 3  PHQ - 2 Score 0 0 4  Altered sleeping 0 0 2  Tired, decreased energy 0 0 1  Change in appetite 0 0 0  Feeling bad or failure about yourself  0 0 1  Trouble concentrating 0 0 1  Moving slowly or fidgety/restless 0 0 0  Suicidal thoughts 0 0 0  PHQ-9 Score 0 0 9  Difficult doing work/chores Not difficult at all Not difficult at all Not difficult at all      Social History   Tobacco Use  Smoking Status Never  Smokeless Tobacco Never   BP Readings from Last 3 Encounters:  08/31/21 130/70  05/13/21 112/62  02/16/21 120/70   Pulse Readings from Last 3 Encounters:  08/31/21 70  05/13/21 91  02/16/21 (!) 59   Wt Readings from Last 3 Encounters:  08/31/21 185 lb 9.6 oz (84.2 kg)  05/13/21 181 lb 3.2 oz (82.2 kg)  02/16/21 181 lb 11.2 oz (82.4 kg)   BMI Readings from Last 3 Encounters:  08/31/21 25.89 kg/m  05/13/21 25.27 kg/m  02/16/21 25.34 kg/m    Assessment/Interventions: Review of patient past medical history, allergies, medications, health status, including review of consultants reports, laboratory and other test data, was performed as part of comprehensive evaluation and provision of chronic care management services.   SDOH:  (Social Determinants of Health) assessments and interventions performed: Yes SDOH Interventions    Flowsheet Row Chronic Care Management from 11/05/2021 in [place] at [place] from 11/10/2015 in [place] at [place]  SDOH Interventions    Depression Interventions/Treatment  -- Currently on Treatment  [Pt see a Teacher, music and is on medication]  Financial Strain Interventions Intervention Not Indicated --      SDOH Screenings    Depression (PHQ2-9): Low Risk  (02/16/2021)  Financial Resource Strain: Low Risk  (11/05/2021)  Tobacco Use: Low Risk  (05/13/2021)    [place]  Allergies  Allergen Reactions   Amoxicillin Rash    Medications Reviewed Today     Reviewed by [name], [name],  MD (Physician) on 08/31/21 at [place]  Med List Status: <None>   Medication Order Taking? Sig Documenting Provider Last Dose Status Informant  amLODipine (NORVASC) 10 MG tablet [id number] Yes TAKE 1 TABLET BY MOUTH DAILY [name], [name], MD Taking Active   atorvastatin (LIPITOR) 40 MG tablet [id number] Yes TAKE 1 TABLET BY MOUTH DAILY [name], [name], MD Taking Active   b complex vitamins tablet [id number] Yes Take 1 tablet by mouth daily. [provider] Taking Active   buPROPion (WELLBUTRIN XL) 150 MG 24 hr tablet [id number] Yes Take 1 tablet (150 mg total) by mouth daily. [name], [name], MD Taking Active   Cholecalciferol (VITAMIN D) 1000 UNITS capsule [id number] Yes Take 1,000 Units by mouth daily. [provider] Taking Active   fluticasone (FLONASE) 50 MCG/ACT nasal spray [id number] Yes Place 1 spray into both nostrils daily. Marletta Lor, MD Taking Active   Lactase 9000 units TABS [id number] Yes Take by mouth. [provider] Taking Active   linaclotide Rolan Lipa) 72 MCG capsule [id number] Yes Take 1 capsule (72 mcg total) by mouth daily before breakfast. [name], [name], MD Taking Active   loratadine (CLARITIN) 10 MG tablet [id number] Yes Take 10 mg by mouth daily. [provider] Taking Active   Omega-3 Fatty Acids (FISH OIL) 1000 MG CAPS [id number] Yes Take 4 capsules by mouth daily. [provider] Taking Active   oxymetazoline (AFRIN) 0.05 % nasal spray [id number] Yes Place 1 spray into both nostrils 2 (two) times daily as needed for congestion. [provider] Taking Active   Probiotic Product (PROBIO DEFENSE PO)  [id number] Yes Take by mouth. [provider] Taking Active   Simethicone 180 MG CAPS [id number] Yes Take by mouth. [provider] Taking Active   temazepam (RESTORIL) 15 MG capsule [id number] Yes TAKE 1 CAPSULE BY MOUTH AT BEDTIME AS NEEDED FOR SLEEP [name], [name], MD Taking Active   triamterene-hydrochlorothiazide [facility]) 37.5-25 MG tablet [id number] Yes TAKE 1 TABLET BY MOUTH IN THE  MORNING [name], [name], MD Taking Active   Turmeric 500 MG TABS [id number] Yes Take by mouth. [provider] Taking Active             Patient Active Problem List   Diagnosis Date Noted   History of colonic polyps 11/05/2014   Dyslipidemia 08/01/2006   Depression, recurrent ([place]) 08/01/2006   Essential hypertension 08/01/2006   BPH (benign prostatic hyperplasia) 08/01/2006    Immunization History  Administered Date(s) Administered   Influenza, High Dose Seasonal PF 01/06/2020   Influenza,inj,Quad PF,6+ Mos 11/05/2014   Influenza-Unspecified 12/05/2020   Janssen (J&J) SARS-COV-2 Vaccination 06/14/2019   Pneumococcal Conjugate-13 04/26/2013   Pneumococcal Polysaccharide-23 10/25/2011   Tdap 09/07/2010, 12/05/2020   Zoster Recombinat (Shingrix) 02/07/2020, 05/12/2020, 07/12/2020   Zoster, Live 10/25/2012    Patient got stung by a wasp 2 days ago and had redness and swelling and pain. Patient used hydrocortisone cream and oatmeal and is red on both sides of the elbow. Patient was getting ready to come back into the house both times and cannot find the nest. Recommended using Benadryl tablets.  Patient is also still having ongoing issues with regular bowel movements. Patient thinks the Wellbutrin is causing the constipation.  Patient wants to try to taper off.   Conditions to be addressed/monitored:  Hypertension, Hyperlipidemia, Depression, BPH, Allergic Rhinitis and Insomnia  Conditions addressed this visit: Depression, allergic rhinitis,  hypertension  Care Plan : CCM Pharmacy  Care Plan  Updates made by [name], [place] since 11/09/2021 12:00 AM     Problem: Problem: Hypertension, Hyperlipidemia, Depression, BPH, Allergic Rhinitis and Insomnia      Long-Range Goal: Patient-Specific Goal   Start Date: 07/22/2020  Expected End Date: 07/22/2021  Recent Progress: On track  Priority: High  Note:   Current Barriers:  Unable to independently monitor therapeutic efficacy  Pharmacist Clinical Goal(s):  Patient will achieve adherence to monitoring guidelines and medication adherence to achieve therapeutic efficacy through collaboration with PharmD and provider.   Interventions: 1:1 collaboration with [name], [name], MD regarding development and update of comprehensive plan of care as evidenced by provider attestation and co-signature Inter-disciplinary care team collaboration (see longitudinal plan of care) Comprehensive medication review performed; medication list updated in electronic medical record  Hypertension (BP goal <140/90) -Controlled -Current treatment: Amlodipine 10 mg 1 tablet daily - taking it at lunch - Appropriate, Effective, Safe, Accessible Triamterene-HCTZ 37.5-25 mg 1 tablet daily in the morning - Appropriate, Effective, Safe, Accessible -Medications previously tried: none -Current home readings: 142/79, 150/83, 148/78, 137/79, 145/78, 152/88, 135/81 (15 points higher on SBP) -Current dietary habits: leaves salt out of recipes and uses sea salt -Current exercise habits: walking 30-45 minutes with dogs -Denies hypotensive/hypertensive symptoms -Educated on BP goals and benefits of medications for prevention of heart attack, stroke and kidney damage; Exercise goal of 150 minutes per week; Importance of home blood pressure monitoring; Proper BP monitoring technique; -Counseled to monitor BP at home weekly, document, and provide log at future appointments -Counseled on diet and exercise  extensively Recommended to continue current medication Consider purchasing a new BP cuff.  Hyperlipidemia: (LDL goal < 100) -Controlled -Current treatment: Atorvastatin 40 mg 1 tablet daily - Appropriate, Effective, Safe, Accessible Omega 3 fatty acids 1000 mg 3 capsules daily - Appropriate, Effective, Safe, Accessible -Medications previously tried: Lopid (gemfibrozil), pravastatin (ineffective) -Current dietary patterns: did not discuss -Current exercise habits: walking 30-45 minutes with dogs -Educated on Cholesterol goals;  Benefits of statin for ASCVD risk reduction; Exercise goal of 150 minutes per week; -Counseled on diet and exercise extensively Recommended to continue current medication Recommended repeat lipid panel.  Depression(Goal: minimize symptoms) -Controlled -Current treatment: Bupropion XL 150 mg 1 tablet daily - Appropriate, Effective, Safe, Accessible -Medications previously tried/failed: none -PHQ9: 0 -Educated on Benefits of medication for symptom control Benefits of cognitive-behavioral therapy with or without medication -Recommended to continue current medication Collaborated with PCP to consider taper.  Insomnia (Goal: improve quality and quantity of sleep) -Controlled -Current treatment  Temazepam 15 mg 1 tablet at bedtime as needed (only using for racing thoughts) - Appropriate, Effective, Query Safe, Accessible -Medications previously tried: Rozerem (ineffective), Sonata (unknown) -Recommended trial period of taking melatonin when he wakes up in the night to see if this helps but recommended taking a low dose -Recommended avoiding Benadryl due to risk of falls and constipation.  Allergic rhinitis (Goal: minimize symptoms) -Controlled -Current treatment  Claritin 10 mg 1 tablet daily - Appropriate, Effective, Safe, Accessible Flonase 50 mcg/act 1 spray in both nostrils daily - Appropriate, Effective, Safe, Accessible Afrin 0.05% 1 spray in both  nostrils twice daily as needed - Appropriate, Effective, Safe, Accessible -Medications previously tried: none  -Counseled on limiting Afrin use to caution with rebound congestion and increasing BP; avoiding allergy triggers   Health Maintenance -Vaccine gaps: tetanus, COVID booster (doesn't want to do MRNA vaccines), influenza -Current therapy:  Vitamin B complex 1 tablet daily Vitamin  D 1000 units 1 capsule daily -Educated on Cost vs benefit of each product must be carefully weighed by individual consumer -Patient is satisfied with current therapy and denies issues -Recommended to continue current medication  Patient Goals/Self-Care Activities Patient will:  - take medications as prescribed check blood pressure weekly, document, and provide at future appointments target a minimum of 150 minutes of moderate intensity exercise weekly  Follow Up Plan: The care management team will reach out to the patient again over the next 7 days.         Medication Assistance: None required.  Patient affirms current coverage meets needs.  Compliance/Adherence/Medication fill history: Care Gaps: COVID booster, influenza Last BP - 130/70 on 08/31/2021   Star-Rating Drugs: Atorvastatin 40 mg  - last filled 08/23/2021 100 DS at [facility]  Patient's preferred pharmacy is:  [facility] [place], [place] - [address] [place] [facility] [address] Phone: [phone] Fax: [phone]  [facility] ([place], [place] [facility] [address] [place] [address] [place] [address] Phone: [phone] Fax: [phone]  [facility] ([facility]) - [place], [place] [address] Phone: [phone] Fax: [phone]   Uses pill box? No - puts medicines on a plastic lid in 3 batches; uses a pillbox when he is not going to be home for lunch Pt endorses  100% compliance  We discussed: Current pharmacy is preferred with insurance plan and patient is satisfied with pharmacy services Patient decided to: Continue current medication management strategy  Care Plan and Follow Up Patient Decision:  Patient agrees to Care Plan and Follow-up.  Plan: The care management team will reach out to the patient again over the next 7 days.  Jeni Salles, PharmD [facility] Clinical Pharmacist [place] [facility] [place]

## 2021-11-09 ENCOUNTER — Telehealth: Payer: Self-pay | Admitting: Pharmacist

## 2021-11-09 NOTE — Chronic Care Management (AMB) (Signed)
    Chronic Care Management Pharmacy Assistant   Name: MISAEL MCGAHA  MRN: [id number] DOB: [birth year]  Reason for Encounter: Patient Assistance Application for Linzess   Application completed, to be mailed to patient on 11/11/2021.  [place] Pharmacist Assistant [phone] 07-02-1945

## 2021-11-09 NOTE — Patient Instructions (Signed)
Hi Richard Sparks,  It was great to catch up again!  Please reach out to me if you have any questions or need anything before I reach back out!  Best, Maddie  Jeni Salles, PharmD, [place] at [place]   Visit Information   Goals Addressed   None    Patient Care Plan: CCM Pharmacy Care Plan     Problem Identified: Problem: Hypertension, Hyperlipidemia, Depression, BPH, Allergic Rhinitis and Insomnia      Long-Range Goal: Patient-Specific Goal   Start Date: 07/22/2020  Expected End Date: 07/22/2021  Recent Progress: On track  Priority: High  Note:   Current Barriers:  Unable to independently monitor therapeutic efficacy  Pharmacist Clinical Goal(s):  Patient will achieve adherence to monitoring guidelines and medication adherence to achieve therapeutic efficacy through collaboration with PharmD and provider.   Interventions: 1:1 collaboration with Richard Sparks, [name], MD regarding development and update of comprehensive plan of care as evidenced by provider attestation and co-signature Inter-disciplinary care team collaboration (see longitudinal plan of care) Comprehensive medication review performed; medication list updated in electronic medical record  Hypertension (BP goal <140/90) -Controlled -Current treatment: Amlodipine 10 mg 1 tablet daily - taking it at lunch - Appropriate, Effective, Safe, Accessible Triamterene-HCTZ 37.5-25 mg 1 tablet daily in the morning - Appropriate, Effective, Safe, Accessible -Medications previously tried: none -Current home readings: 142/79, 150/83, 148/78, 137/79, 145/78, 152/88, 135/81 (15 points higher on SBP) -Current dietary habits: leaves salt out of recipes and uses sea salt -Current exercise habits: walking 30-45 minutes with dogs -Denies hypotensive/hypertensive symptoms -Educated on BP goals and benefits of medications for prevention of heart attack, stroke and kidney  damage; Exercise goal of 150 minutes per week; Importance of home blood pressure monitoring; Proper BP monitoring technique; -Counseled to monitor BP at home weekly, document, and provide log at future appointments -Counseled on diet and exercise extensively Recommended to continue current medication Consider purchasing a new BP cuff.  Hyperlipidemia: (LDL goal < 100) -Controlled -Current treatment: Atorvastatin 40 mg 1 tablet daily - Appropriate, Effective, Safe, Accessible Omega 3 fatty acids 1000 mg 3 capsules daily - Appropriate, Effective, Safe, Accessible -Medications previously tried: Lopid (gemfibrozil), pravastatin (ineffective) -Current dietary patterns: did not discuss -Current exercise habits: walking 30-45 minutes with dogs -Educated on Cholesterol goals;  Benefits of statin for ASCVD risk reduction; Exercise goal of 150 minutes per week; -Counseled on diet and exercise extensively Recommended to continue current medication Recommended repeat lipid panel.  Depression(Goal: minimize symptoms) -Controlled -Current treatment: Bupropion XL 150 mg 1 tablet daily - Appropriate, Effective, Safe, Accessible -Medications previously tried/failed: none -PHQ9: 0 -Educated on Benefits of medication for symptom control Benefits of cognitive-behavioral therapy with or without medication -Recommended to continue current medication Collaborated with PCP to consider taper.  Insomnia (Goal: improve quality and quantity of sleep) -Controlled -Current treatment  Temazepam 15 mg 1 tablet at bedtime as needed (only using for racing thoughts) - Appropriate, Effective, Query Safe, Accessible -Medications previously tried: Rozerem (ineffective), Sonata (unknown) -Recommended trial period of taking melatonin when he wakes up in the night to see if this helps but recommended taking a low dose -Recommended avoiding Benadryl due to risk of falls and constipation.  Allergic rhinitis (Goal:  minimize symptoms) -Controlled -Current treatment  Claritin 10 mg 1 tablet daily - Appropriate, Effective, Safe, Accessible Flonase 50 mcg/act 1 spray in both nostrils daily - Appropriate, Effective, Safe, Accessible Afrin 0.05% 1 spray in both nostrils twice daily as needed -  Appropriate, Effective, Safe, Accessible -Medications previously tried: none  -Counseled on limiting Afrin use to caution with rebound congestion and increasing BP; avoiding allergy triggers   Health Maintenance -Vaccine gaps: tetanus, COVID booster (doesn't want to do MRNA vaccines), influenza -Current therapy:  Vitamin B complex 1 tablet daily Vitamin D 1000 units 1 capsule daily -Educated on Cost vs benefit of each product must be carefully weighed by individual consumer -Patient is satisfied with current therapy and denies issues -Recommended to continue current medication  Patient Goals/Self-Care Activities Patient will:  - take medications as prescribed check blood pressure weekly, document, and provide at future appointments target a minimum of 150 minutes of moderate intensity exercise weekly  Follow Up Plan: The care management team will reach out to the patient again over the next 7 days.         Patient verbalizes understanding of instructions and care plan provided today and agrees to view in [place]. Active MyChart status and patient understanding of how to access instructions and care plan via MyChart confirmed with patient.    The pharmacy team will reach out to the patient again over the next 7 days.   [name], [facility]

## 2021-11-11 ENCOUNTER — Other Ambulatory Visit: Payer: Self-pay | Admitting: Internal Medicine

## 2021-11-11 DIAGNOSIS — G4700 Insomnia, unspecified: Secondary | ICD-10-CM

## 2021-11-29 ENCOUNTER — Telehealth: Payer: Self-pay | Admitting: Internal Medicine

## 2021-11-29 NOTE — Telephone Encounter (Signed)
Patient dropped off paperwork to be completed by pcp. Paperwork placed in file to be completed.       Please advise

## 2021-12-01 ENCOUNTER — Other Ambulatory Visit: Payer: Self-pay | Admitting: Internal Medicine

## 2021-12-04 DIAGNOSIS — I10 Essential (primary) hypertension: Secondary | ICD-10-CM | POA: Diagnosis not present

## 2021-12-04 DIAGNOSIS — E785 Hyperlipidemia, unspecified: Secondary | ICD-10-CM

## 2021-12-04 DIAGNOSIS — F32A Depression, unspecified: Secondary | ICD-10-CM

## 2021-12-06 NOTE — Telephone Encounter (Signed)
This is being taken care by Maddie.

## 2021-12-14 ENCOUNTER — Telehealth: Payer: Self-pay | Admitting: Pharmacist

## 2021-12-14 NOTE — Chronic Care Management (AMB) (Signed)
    Chronic Care Management Pharmacy Assistant   Name: Richard Sparks  MRN: [id number] DOB: 02/04/1946  Reason for Encounter: Patient Assistance for [place] with Jennet Maduro at [facility], Patient has been approved as of 12/14/2021 through 03/07/2023. Patients first order scheduled to process and ship today 12/14/2021, it will take 10-14 business days for patient to received this order.    Left message for patient with information above.   [place] Pharmacist Assistant [phone]

## 2021-12-29 NOTE — Progress Notes (Unsigned)
12/29/2021 Richard Sparks [id number] 1945-09-17   Chief Complaint:  History of Present Illness: Richard Sparks is a 76 year old male with a past medical history of depression, hypertension, hyperlipidemia, BPH, chronic constipation and colon polyps. I last saw him in office on 05/13/2021 due to having a change in bowel pattern.     over the past year he intermittently has increased urgency to have a bowel movement and passes only gas with nonbloody mucus per the rectum.  He passes a soft stool which follows a part in the toilet water following a few episodes of passing mucus per the rectum.  No rectal bleeding or melena.  No abdominal or rectal pain.   His most recent colonoscopy by Dr. Henrene Pastor was 02/22/2019 which identified one 5 mm polyp which was removed from the transverse colon and internal hemorrhoids.  Biopsies of the polyp showed plant matter.  A repeat colonoscopy in 5 years was to be considered if medically appropriate.  He does not wish to pursue any further colon polyp surveillance colonoscopies.     Latest Ref Rng & Units 02/16/2021    8:59 AM 02/13/2020    7:42 AM 11/22/2018   10:26 AM  CBC  WBC 4.0 - 10.5 K/uL 8.0  7.5  7.6   Hemoglobin 13.0 - 17.0 g/dL 16.0  15.9  17.3   Hematocrit 39.0 - 52.0 % 45.5  46.3  49.1   Platelets 150.0 - 400.0 K/uL 221.0  237.0  234.0        Latest Ref Rng & Units 02/16/2021    8:59 AM 02/13/2020    7:42 AM 11/22/2018   10:26 AM  CMP  Glucose 70 - 99 mg/dL 110  103  95   BUN 6 - 23 mg/dL '15  22  19   '$ Creatinine 0.40 - 1.50 mg/dL 1.18  1.23  1.22   Sodium 135 - 145 mEq/L 141  142  140   Potassium 3.5 - 5.1 mEq/L 3.7  3.6  3.8   Chloride 96 - 112 mEq/L 104  103  103   CO2 19 - 32 mEq/L '29  31  29   '$ Calcium 8.4 - 10.5 mg/dL 9.7  9.7  10.1   Total Protein 6.0 - 8.3 g/dL 6.4  6.7  6.7   Total Bilirubin 0.2 - 1.2 mg/dL 1.1  0.9  0.9   Alkaline Phos 39 - 117 U/L 71  56  59   AST 0 - 37 U/L '15  19  20   '$ ALT 0 - 53 U/L '21  24  23      '$ Colonoscopy 02/22/2019 by Dr. Henrene Pastor:  One 5 mm polyp in the transverse colon, removed with a cold snare. Resected and retrieved. - Internal hemorrhoids. - The examination was otherwise normal on direct and retroflexion views. - 5 year colonoscopy recall Surgical [P], colon, transverse, polyp - PLANT MATTER CONSISTENT WITH SEED. - NO COLONIC MUCOSA, ADENOMATOUS CHANGE OR EVIDENCE OF MALIGNANCY.   Colonoscopy 12/31/2013: 1. Two polyps measuring 3 mm in size were found in the ascending colon; polypectomy was performed with a cold snare 2. The examination was otherwise normal - TUBULAR ADENOMA (2). NO HIGH GRADE DYSPLASIA OR MALIGNANCY IDENTIFIED.   Colonoscopy 02/16/2004: Normal colonoscopy     Current Medications, Allergies, Past Medical History, Past Surgical History, Family History and Social History were reviewed in [employer] record.   Review of Systems:   Constitutional: Negative for fever,  sweats, chills or weight loss.  Respiratory: Negative for shortness of breath.   Cardiovascular: Negative for chest pain, palpitations and leg swelling.  Gastrointestinal: See HPI.  Musculoskeletal: Negative for back pain or muscle aches.  Neurological: Negative for dizziness, headaches or paresthesias.    Physical Exam: There were no vitals taken for this visit. General: Well developed, w   ***male in no acute distress. Head: Normocephalic and atraumatic. Eyes: No scleral icterus. Conjunctiva pink . Ears: Normal auditory acuity. Mouth: Dentition intact. No ulcers or lesions.  Lungs: Clear throughout to auscultation. Heart: Regular rate and rhythm, no murmur. Abdomen: Soft, nontender and nondistended. No masses or hepatomegaly. Normal bowel sounds x 4 quadrants.  Rectal: *** Musculoskeletal: Symmetrical with no gross deformities. Extremities: No edema. Neurological: Alert oriented x 4. No focal deficits.  Psychological: Alert and cooperative. Normal mood  and affect  Assessment and Recommendations: ***

## 2021-12-30 ENCOUNTER — Ambulatory Visit: Payer: Medicare Other | Admitting: Nurse Practitioner

## 2021-12-30 ENCOUNTER — Encounter: Payer: Self-pay | Admitting: Nurse Practitioner

## 2021-12-30 DIAGNOSIS — K5909 Other constipation: Secondary | ICD-10-CM | POA: Diagnosis not present

## 2021-12-30 DIAGNOSIS — K5900 Constipation, unspecified: Secondary | ICD-10-CM | POA: Insufficient documentation

## 2021-12-30 NOTE — Progress Notes (Signed)
Noted  

## 2021-12-30 NOTE — Patient Instructions (Addendum)
Take Linzess 84mg one tab to be taken 30 minutes before breakfast   Do not take Miralax or Colace for the first few days, see how you respond to the [place] You can add back Miralax once daily or Colace twice daily as needed.  Continue Metamucil once daily   Follow up as needed

## 2022-01-14 DIAGNOSIS — H43393 Other vitreous opacities, bilateral: Secondary | ICD-10-CM | POA: Diagnosis not present

## 2022-02-16 ENCOUNTER — Ambulatory Visit (INDEPENDENT_AMBULATORY_CARE_PROVIDER_SITE_OTHER): Payer: Medicare Other | Admitting: Internal Medicine

## 2022-02-16 ENCOUNTER — Ambulatory Visit: Payer: Medicare Other | Admitting: Internal Medicine

## 2022-02-16 ENCOUNTER — Encounter: Payer: Self-pay | Admitting: Internal Medicine

## 2022-02-16 VITALS — BP 130/80 | HR 60 | Temp 97.5°F | Ht 70.5 in | Wt 183.8 lb

## 2022-02-16 DIAGNOSIS — N400 Enlarged prostate without lower urinary tract symptoms: Secondary | ICD-10-CM

## 2022-02-16 DIAGNOSIS — F339 Major depressive disorder, recurrent, unspecified: Secondary | ICD-10-CM

## 2022-02-16 DIAGNOSIS — K5909 Other constipation: Secondary | ICD-10-CM

## 2022-02-16 DIAGNOSIS — Z0000 Encounter for general adult medical examination without abnormal findings: Secondary | ICD-10-CM | POA: Diagnosis not present

## 2022-02-16 DIAGNOSIS — I10 Essential (primary) hypertension: Secondary | ICD-10-CM | POA: Diagnosis not present

## 2022-02-16 DIAGNOSIS — E785 Hyperlipidemia, unspecified: Secondary | ICD-10-CM | POA: Diagnosis not present

## 2022-02-16 LAB — CBC WITH DIFFERENTIAL/PLATELET
Basophils Absolute: 0.1 10*3/uL (ref 0.0–0.1)
Basophils Relative: 0.5 % (ref 0.0–3.0)
Eosinophils Absolute: 0.3 10*3/uL (ref 0.0–0.7)
Eosinophils Relative: 2.3 % (ref 0.0–5.0)
HCT: 48.3 % (ref 39.0–52.0)
Hemoglobin: 16.8 g/dL (ref 13.0–17.0)
Lymphocytes Relative: 16.9 % (ref 12.0–46.0)
Lymphs Abs: 2.2 10*3/uL (ref 0.7–4.0)
MCHC: 34.8 g/dL (ref 30.0–36.0)
MCV: 86.6 fl (ref 78.0–100.0)
Monocytes Absolute: 0.8 10*3/uL (ref 0.1–1.0)
Monocytes Relative: 6.4 % (ref 3.0–12.0)
Neutro Abs: 9.8 10*3/uL — ABNORMAL HIGH (ref 1.4–7.7)
Neutrophils Relative %: 73.9 % (ref 43.0–77.0)
Platelets: 264 10*3/uL (ref 150.0–400.0)
RBC: 5.58 Mil/uL (ref 4.22–5.81)
RDW: 13.3 % (ref 11.5–15.5)
WBC: 13.3 10*3/uL — ABNORMAL HIGH (ref 4.0–10.5)

## 2022-02-16 LAB — COMPREHENSIVE METABOLIC PANEL
ALT: 20 U/L (ref 0–53)
AST: 15 U/L (ref 0–37)
Albumin: 4.5 g/dL (ref 3.5–5.2)
Alkaline Phosphatase: 74 U/L (ref 39–117)
BUN: 18 mg/dL (ref 6–23)
CO2: 32 mEq/L (ref 19–32)
Calcium: 10.4 mg/dL (ref 8.4–10.5)
Chloride: 102 mEq/L (ref 96–112)
Creatinine, Ser: 1.23 mg/dL (ref 0.40–1.50)
GFR: 57.44 mL/min — ABNORMAL LOW (ref >60.00)
Glucose, Bld: 102 mg/dL — ABNORMAL HIGH (ref 70–99)
Potassium: 4 mEq/L (ref 3.5–5.1)
Sodium: 143 mEq/L (ref 135–145)
Total Bilirubin: 1.1 mg/dL (ref 0.2–1.2)
Total Protein: 6.7 g/dL (ref 6.0–8.3)

## 2022-02-16 LAB — LIPID PANEL
Cholesterol: 142 mg/dL (ref 0–200)
HDL: 42.5 mg/dL (ref >39.00)
LDL Cholesterol: 69 mg/dL (ref 0–99)
NonHDL: 99.32
Total CHOL/HDL Ratio: 3
Triglycerides: 152 mg/dL — ABNORMAL HIGH (ref 0.0–149.0)
VLDL: 30.4 mg/dL (ref 0.0–40.0)

## 2022-02-16 LAB — PSA: PSA: 3.91 ng/mL (ref 0.10–4.00)

## 2022-02-16 LAB — TSH: TSH: 2.31 u[IU]/mL (ref 0.35–5.50)

## 2022-02-16 LAB — HEMOGLOBIN A1C: Hgb A1c MFr Bld: 5.8 % (ref 4.6–6.5)

## 2022-02-16 NOTE — Progress Notes (Signed)
Established Patient Office Visit     CC/Reason for Visit: Annual preventive exam and subsequent Medicare wellness visit  HPI: Richard Sparks is a 76 y.o. male who is coming in today for the above mentioned reasons. Past Medical History is significant for: Hypertension, hyperlipidemia, depression, insomnia, constipation.  He has no acute concerns.  Has routine eye and dental care.  No perceived hearing difficulty.  He is due for RSV and COVID vaccines.   Past Medical/Surgical History: Past Medical History:  Diagnosis Date   Allergy    BENIGN PROSTATIC HYPERTROPHY 08/01/2006   DEPRESSION 08/01/2006   HYPERLIPIDEMIA 08/01/2006   HYPERTENSION 08/01/2006    Past Surgical History:  Procedure Laterality Date   COLONOSCOPY  last 12/31/2013   NO PAST SURGERIES      Social History:  reports that he has never smoked. He has never used smokeless tobacco. He reports that he does not drink alcohol and does not use drugs.  Allergies: Allergies  Allergen Reactions   Amoxicillin Rash    Family History:  Family History  Problem Relation Age of Onset   Colon cancer Neg Hx    Esophageal cancer Neg Hx    Rectal cancer Neg Hx    Stomach cancer Neg Hx    Colon polyps Neg Hx      Current Outpatient Medications:    amLODipine (NORVASC) 10 MG tablet, TAKE 1 TABLET BY MOUTH DAILY, Disp: 100 tablet, Rfl: 2   atorvastatin (LIPITOR) 40 MG tablet, TAKE 1 TABLET BY MOUTH DAILY, Disp: 100 tablet, Rfl: 2   b complex vitamins tablet, Take 1 tablet by mouth daily., Disp: , Rfl:    buPROPion (WELLBUTRIN XL) 150 MG 24 hr tablet, TAKE 1 TABLET BY MOUTH DAILY, Disp: 90 tablet, Rfl: 1   Cholecalciferol (VITAMIN D) 1000 UNITS capsule, Take 1,000 Units by mouth daily., Disp: , Rfl:    fluticasone (FLONASE) 50 MCG/ACT nasal spray, Place 1 spray into both nostrils daily., Disp: 16 g, Rfl: 5   linaclotide (LINZESS) 72 MCG capsule, Take 1 capsule (72 mcg total) by mouth daily before breakfast., Disp: 90  capsule, Rfl: 1   loratadine (CLARITIN) 10 MG tablet, Take 10 mg by mouth daily., Disp: , Rfl:    Omega-3 Fatty Acids (FISH OIL) 1000 MG CAPS, Take 4 capsules by mouth daily., Disp: , Rfl:    oxymetazoline (AFRIN) 0.05 % nasal spray, Place 1 spray into both nostrils 2 (two) times daily as needed for congestion., Disp: , Rfl:    Probiotic Product (PROBIO DEFENSE PO), Take by mouth., Disp: , Rfl:    Simethicone 180 MG CAPS, Take by mouth., Disp: , Rfl:    temazepam (RESTORIL) 15 MG capsule, TAKE 1 CAPSULE BY MOUTH AT BEDTIME AS NEEDED FOR SLEEP, Disp: 90 capsule, Rfl: 0   triamterene-hydrochlorothiazide (MAXZIDE-25) 37.5-25 MG tablet, TAKE 1 TABLET BY MOUTH IN THE  MORNING, Disp: 100 tablet, Rfl: 2   Turmeric 500 MG TABS, Take by mouth., Disp: , Rfl:   Review of Systems:  Constitutional: Denies fever, chills, diaphoresis, appetite change and fatigue.  HEENT: Denies photophobia, eye pain, redness, hearing loss, ear pain, congestion, sore throat, rhinorrhea, sneezing, mouth sores, trouble swallowing, neck pain, neck stiffness and tinnitus.   Respiratory: Denies SOB, DOE, cough, chest tightness,  and wheezing.   Cardiovascular: Denies chest pain, palpitations and leg swelling.  Gastrointestinal: Denies nausea, vomiting, abdominal pain, diarrhea, constipation, blood in stool and abdominal distention.  Genitourinary: Denies dysuria, urgency, frequency, hematuria,  flank pain and difficulty urinating.  Endocrine: Denies: hot or cold intolerance, sweats, changes in hair or nails, polyuria, polydipsia. Musculoskeletal: Denies myalgias, back pain, joint swelling, arthralgias and gait problem.  Skin: Denies pallor, rash and wound.  Neurological: Denies dizziness, seizures, syncope, weakness, light-headedness, numbness and headaches.  Hematological: Denies adenopathy. Easy bruising, personal or family bleeding history  Psychiatric/Behavioral: Denies suicidal ideation, mood changes, confusion, nervousness,  sleep disturbance and agitation    Physical Exam: Vitals:   02/16/22 0707  BP: 130/80  Pulse: 60  Temp: (!) 97.5 F (36.4 C)  TempSrc: Oral  SpO2: 99%  Weight: 183 lb 12.8 oz (83.4 kg)  Height: 5' 10.5" (1.791 m)    Body mass index is 26 kg/m.   Constitutional: NAD, calm, comfortable Eyes: PERRL, lids and conjunctivae normal ENMT: Mucous membranes are moist. Posterior pharynx clear of any exudate or lesions. Normal dentition. Tympanic membrane is pearly white, no erythema or bulging. Neck: normal, supple, no masses, no thyromegaly Respiratory: clear to auscultation bilaterally, no wheezing, no crackles. Normal respiratory effort. No accessory muscle use.  Cardiovascular: Regular rate and rhythm, no murmurs / rubs / gallops. No extremity edema. 2+ pedal pulses. No carotid bruits.  Abdomen: no tenderness, no masses palpated. No hepatosplenomegaly. Bowel sounds positive.  Musculoskeletal: no clubbing / cyanosis. No joint deformity upper and lower extremities. Good ROM, no contractures. Normal muscle tone.  Skin: no rashes, lesions, ulcers. No induration Neurologic: CN 2-12 grossly intact. Sensation intact, DTR normal. Strength 5/5 in all 4.  Psychiatric: Normal judgment and insight. Alert and oriented x 3. Normal mood.   Subsequent Medicare wellness visit   1. Risk factors, based on past  M,S,F -cardiovascular disease risk factors include age, gender, history of hypertension and hyperlipidemia   2.  Physical activities: Swims and walks daily   3.  Depression/mood: History of depression but mood is stable   4.  Hearing: No perceived deficit   5.  ADL's: Independent in all ADLs   6.  Fall risk: Low fall risk   7.  Home safety: No problems identified   8.  Height weight, and visual acuity: height and weight as above, vision:  Vision Screening   Right eye Left eye Both eyes  Without correction '20/25 20/25 20/25 '$  With correction        9.  Counseling: Advised to  update age-appropriate immunizations   10. Lab orders based on risk factors: Laboratory update will be reviewed   11. Referral : None today   12. Care plan: Follow-up with me in 1 year   13. Cognitive assessment: No cognitive impairment   14. Screening: Patient provided with a written and personalized 5-10 year screening schedule in the AVS. yes   15. Provider List Update: PCP, GI Dr. Henrene Pastor  16. Advance Directives: Full code   17. Opioids: Patient is not on any opioid prescriptions and has no risk factors for a substance use disorder.   [place] Office Visit from 02/16/2022 in [facility] at [place]  PHQ-9 Total Score 7          11/22/2018    9:35 AM 02/13/2020    7:00 AM 02/16/2021    8:01 AM 02/16/2021    8:02 AM 02/16/2022    7:04 AM  Fall Risk  Falls in the past year? 0 0 0 0 0  Was there an injury with Fall? 0 0 0 0 0  Fall Risk Category Calculator 0 0 0 0 0  Fall  Risk Category Low Low Low Low Low  Patient Fall Risk Level     Low fall risk  Patient at Risk for Falls Due to     No Fall Risks  Fall risk Follow up     Falls evaluation completed      Impression and Plan:  Encounter for preventive health examination  Dyslipidemia - Plan: Hemoglobin A1c, Lipid panel  Benign prostatic hyperplasia without lower urinary tract symptoms - Plan: PSA  Other constipation  Essential hypertension - Plan: CBC with Differential/Platelet, Comprehensive metabolic panel  Depression, recurrent ([place]) - Plan: TSH  -Recommend routine eye and dental care. -Immunizations: Declines RSV and COVID despite counseling, other immunizations are up-to-date -Healthy lifestyle discussed in detail. -Labs to be updated today. -Colon cancer screening: 02/2019, 5-year follow-up -Breast cancer screening: Not applicable -Cervical cancer screening: Not applicable -Lung cancer screening: Not applicable -Prostate cancer screening: PSA today -DEXA: Not applicable    Rayquan Amrhein  [name], MD [facility] at [facility]

## 2022-03-11 NOTE — Telephone Encounter (Unsigned)
Spoke with pt over the phone. Pt stated that the Linzess is not having any effect on him. Chart reviewed. Pt questioned if he had been taking Miralax as well. Pt stated that he has been taking Miralax as well.  Pt stated that he had received the Medication Linzess through a different provider. Dr. Thalia Party  Pt was scheduled for an office visit to see [name] NP to discuss further recommendations: Pt scheduled on 03/22/2022 at 1:30 to see [name] NP: Mamie Nick

## 2022-03-22 ENCOUNTER — Ambulatory Visit: Payer: Medicare Other | Admitting: Nurse Practitioner

## 2022-03-22 ENCOUNTER — Other Ambulatory Visit: Payer: Self-pay | Admitting: Nurse Practitioner

## 2022-03-22 ENCOUNTER — Encounter: Payer: Self-pay | Admitting: Nurse Practitioner

## 2022-03-22 VITALS — BP 122/72 | HR 82 | Ht 70.0 in | Wt 189.0 lb

## 2022-03-22 DIAGNOSIS — K5900 Constipation, unspecified: Secondary | ICD-10-CM | POA: Diagnosis not present

## 2022-03-22 MED ORDER — LINACLOTIDE 145 MCG PO CAPS: 145.0000 ug | ORAL_CAPSULE | Freq: Every day | ORAL | 3 refills | Status: DC

## 2022-03-22 NOTE — Progress Notes (Signed)
Noted  

## 2022-03-22 NOTE — Progress Notes (Signed)
03/22/2022 Richard Sparks [id number] April 08, 1945   Chief Complaint: Constipation follow up   History of Present Illness: Richard Sparks is a 77 year old male with a past medical history of depression, hypertension, hyperlipidemia, BPH, chronic constipation and colon polyps. He started taking Linzess 72 mcg po QD 2 1/2 months ago without significant improvement. He is enrolled in the [employer] assistance program which has reduced the out of pocket cost for Linzess. He did not take Linzess for 5 to 6 days during the Christmas holiday and his constipation didn't worsen. His bowel frequency remains irregular.  He describes going to the bathroom 3-4 times daily and passes small stools and sometimes he passes only gas and nonbloody mucus per the rectum. No rectal bleeding or black stools.  No abdominal or rectal pain.  He takes Metamucil once daily. He drinks at least 64 ounces of liquids daily.  No weight loss.  His most recent colonoscopy by Dr. Henrene Pastor was 02/22/2019 which identified one 5 mm polyp which was removed from the transverse colon and internal hemorrhoids.  Biopsies of the polyp showed plant matter.  A repeat colonoscopy in 5 years was to be considered if medically appropriate.  As previously reviewed, he does not wish to pursue any further colon polyp surveillance colonoscopies.      Latest Ref Rng & Units 02/16/2022    7:35 AM 02/16/2021    8:59 AM 02/13/2020    7:42 AM  CBC  WBC 4.0 - 10.5 K/uL 13.3  8.0  7.5   Hemoglobin 13.0 - 17.0 g/dL 16.8  16.0  15.9   Hematocrit 39.0 - 52.0 % 48.3  45.5  46.3   Platelets 150.0 - 400.0 K/uL 264.0  221.0  237.0         Latest Ref Rng & Units 02/16/2022    7:35 AM 02/16/2021    8:59 AM 02/13/2020    7:42 AM  CMP  Glucose 70 - 99 mg/dL 102  110  103   BUN 6 - 23 mg/dL '18  15  22   '$ Creatinine 0.40 - 1.50 mg/dL 1.23  1.18  1.23   Sodium 135 - 145 mEq/L 143  141  142   Potassium 3.5 - 5.1 mEq/L 4.0  3.7  3.6   Chloride 96 - 112 mEq/L 102  104   103   CO2 19 - 32 mEq/L 32  29  31   Calcium 8.4 - 10.5 mg/dL 10.4  9.7  9.7   Total Protein 6.0 - 8.3 g/dL 6.7  6.4  6.7   Total Bilirubin 0.2 - 1.2 mg/dL 1.1  1.1  0.9   Alkaline Phos 39 - 117 U/L 74  71  56   AST 0 - 37 U/L '15  15  19   '$ ALT 0 - 53 U/L '20  21  24     '$ Current Outpatient Medications on File Prior to Visit  Medication Sig Dispense Refill   amLODipine (NORVASC) 10 MG tablet TAKE 1 TABLET BY MOUTH DAILY 100 tablet 2   atorvastatin (LIPITOR) 40 MG tablet TAKE 1 TABLET BY MOUTH DAILY 100 tablet 2   b complex vitamins tablet Take 1 tablet by mouth daily.     buPROPion (WELLBUTRIN XL) 150 MG 24 hr tablet TAKE 1 TABLET BY MOUTH DAILY 90 tablet 1   Cholecalciferol (VITAMIN D) 1000 UNITS capsule Take 1,000 Units by mouth daily.     fluticasone (FLONASE) 50 MCG/ACT nasal spray Place  1 spray into both nostrils daily. (Patient taking differently: Place 1 spray into both nostrils daily as needed.) 16 g 5   linaclotide (LINZESS) 72 MCG capsule Take 1 capsule (72 mcg total) by mouth daily before breakfast. 90 capsule 1   loratadine (CLARITIN) 10 MG tablet Take 10 mg by mouth daily as needed.     Omega-3 Fatty Acids (FISH OIL) 1000 MG CAPS Take 4 capsules by mouth daily.     oxymetazoline (AFRIN) 0.05 % nasal spray Place 1 spray into both nostrils 2 (two) times daily as needed for congestion.     Probiotic Product (PROBIO DEFENSE PO) Take by mouth.     Simethicone 180 MG CAPS Take by mouth.     temazepam (RESTORIL) 15 MG capsule TAKE 1 CAPSULE BY MOUTH AT BEDTIME AS NEEDED FOR SLEEP 90 capsule 0   triamterene-hydrochlorothiazide (MAXZIDE-25) 37.5-25 MG tablet TAKE 1 TABLET BY MOUTH IN THE  MORNING 100 tablet 2   Turmeric 500 MG TABS Take by mouth.     No current facility-administered medications on file prior to visit.   Allergies  Allergen Reactions   Amoxicillin Rash    Current Medications, Allergies, Past Medical History, Past Surgical History, Family History and Social History  were reviewed in [employer] record.   Review of Systems:   Constitutional: Negative for fever, sweats, chills or weight loss.  Respiratory: Negative for shortness of breath.   Cardiovascular: Negative for chest pain, palpitations and leg swelling.  Gastrointestinal: See HPI.  Musculoskeletal: Negative for back pain or muscle aches.  Neurological: Negative for dizziness, headaches or paresthesias.    Physical Exam: Ht '5\' 10"'$  (1.778 m)   Wt 189 lb (85.7 kg)   BMI 27.12 kg/m  Wt Readings from Last 3 Encounters:  03/22/22 189 lb (85.7 kg)  02/16/22 183 lb 12.8 oz (83.4 kg)  12/30/21 190 lb (86.2 kg)    General: 77 year old male in no acute distress. Head: Normocephalic and atraumatic. Eyes: No scleral icterus. Conjunctiva pink . Ears: Normal auditory acuity. Lungs: Clear throughout to auscultation. Heart: Regular rate and rhythm, no murmur. Abdomen: Soft, nontender and nondistended. No masses or hepatomegaly. Normal bowel sounds x 4 quadrants.  Rectal: Deferred. Musculoskeletal: Symmetrical with no gross deformities. Extremities: No edema. Neurological: Alert oriented x 4. No focal deficits.  Psychological: Alert and cooperative. Normal mood and affect  Assessment and Recommendations:  31)  77 year old male with chronic constipation no significant improvement on Linzess 72 mcg p.o. daily for the past 2 and half months.  He sometimes passes only gas and nonbloody mucus per the rectum.  -Increase Linzess to 145 mcg 1 capsule p.o. daily to be taken 30 minutes before breakfast.  He may take Linzess 45mg two tabs po QD to use up his current supply. -Continue Metamucil QD -Patient wishes to try a modified MiraLAX purge as follows: MiraLAX 1 capful mixed in 8 ounces of clear liquid of choice every 2 hours x 4 doses.  -Fiber diet as tolerated -Patient to contact me in 2 weeks with an update -Follow-up as needed

## 2022-03-22 NOTE — Patient Instructions (Addendum)
Miralax purge instructions as follows: Take Miralax one capful mixed in 8 ounces of clear liquid of choice. Repeat same dose every 2 hours x 4 doses. Do not take Linzess day of taking Miralax purge.   Increase dose of Linzess as follows Linzess 62mg tab take 2 tabs once daily from your current supply until you run out.  Contact Colleen NP in 2 weeks with an update   A new prescription for Linzess 1424m one tab by mouth to be taken 30 minutes before breakfast will be sent to your pharmacy and [facility] this medication will [facility]

## 2022-03-23 ENCOUNTER — Telehealth: Payer: Self-pay | Admitting: Nurse Practitioner

## 2022-03-23 NOTE — Telephone Encounter (Signed)
PT was told to do a Miralax purge. He wants to know if he can have any solid foods while doing the purge or clear liquids only. Please advise

## 2022-03-23 NOTE — Telephone Encounter (Signed)
Pt questioned if he should stay on a clear liquid diet the entire time while he does his miralx Purge. Pt was notified that he does not need to stay on clear liquids but could just have a light meal and drink plenty of fluids: Pt verbalized understanding with all questions answered.

## 2022-03-29 ENCOUNTER — Telehealth: Payer: Self-pay | Admitting: Nurse Practitioner

## 2022-03-29 NOTE — Telephone Encounter (Signed)
Patient called, stated he has yet to receive his Linzess medication. Patient is requesting follow up. Says it is urgent. Please advise.

## 2022-03-30 NOTE — Telephone Encounter (Signed)
Pt stated that he had an office visit last week with Jaclyn Shaggy and that he made a copy of a Letter from [employer] and gave to [place]. Pt stating that Prescription for the [facility]  needs to be sent to [employer] so he can get the medication: Please assist

## 2022-04-01 ENCOUNTER — Other Ambulatory Visit (HOSPITAL_COMMUNITY): Payer: Self-pay

## 2022-04-01 NOTE — Telephone Encounter (Signed)
Hey, I talk to pt and told him I would be faxing the provider portion to office today for dose increase for linzess to send to company.Please  have [name] NP review and fax to back to [place].   Patient did states that he wants me to follow up with him as soon as I get the provider portion back and sent to company,  so he can call the company and let them know it has been sent because he has not had it in almost 2 weeks and he has an appointment with NP next Friday.

## 2022-04-01 NOTE — Telephone Encounter (Signed)
Message was sent to [employer] team the day of his last visit to resumbit his [employer] assist application

## 2022-04-01 NOTE — Telephone Encounter (Signed)
Hey, I talk to pt and told him I would be faxing the provider portion to office today for dose increase for linzess to send to company, Please have [name] NP review and fax to back to [phone] ATTENTION [name] L. Patient states that he wants me to follow up with him as soon as I get the provider portion back so he can call the company and let them know it has been sent because he has not had it in almost 2 weeks and he has an appointment with NP next Friday

## 2022-04-04 NOTE — Telephone Encounter (Signed)
DD, I signed Linzess 145 mcg QD form and place it on your desk. Pls fax to number listed in msg below. THX

## 2022-04-05 NOTE — Telephone Encounter (Signed)
I receive doctor portion for DOSE INCREASE for LINZESS PAP Application.   Please note: Submitted application for LINZESS (DOSSE INCREASE ) to [place] for patient assistance.   Phone: [phone]

## 2022-04-08 ENCOUNTER — Ambulatory Visit: Payer: Medicare Other | Admitting: Nurse Practitioner

## 2022-04-08 ENCOUNTER — Encounter: Payer: Self-pay | Admitting: Nurse Practitioner

## 2022-04-08 VITALS — BP 116/58 | HR 64 | Ht 70.0 in | Wt 186.0 lb

## 2022-04-08 DIAGNOSIS — K5900 Constipation, unspecified: Secondary | ICD-10-CM

## 2022-04-08 NOTE — Patient Instructions (Addendum)
You can take over the counter dulcolax 1-2 every 3rd night as needed.   You have been given samples of Linzess 145 mcg once daily.   Stop taking Metamucil.   Start over the counter Benefiber 1 tablespoon daily.   Follow up as needed.   The [place] [facility] providers would like to encourage you to use [facility] to communicate with providers for non-urgent requests or questions.  Due to long hold times on the telephone, sending your provider a message by [facility] may be a faster and more efficient way to get a response.  Please allow 48 business hours for a response.  Please remember that this is for non-urgent requests.

## 2022-04-08 NOTE — Progress Notes (Signed)
04/08/2022 Richard Sparks [id number] 06-Dec-1945   Chief Complaint: Constipation  History of Present Illness: Richard Sparks is a 77 year old male with a past medical history of depression, hypertension, hyperlipidemia, BPH, chronic constipation and colon polyps.  I last saw Mr. Dacy in office 03/22/2022 due to having constipation.  At that time, he was prescribed a MiraLAX purge with instructions to increase Linzess from 72 to 145 mcg daily.  He elected to follow up today to discuss his bowel pattern and response to his bowel regimen. He took a modified MiraLAX bowel cleanse 03/28/2022 which resulted in passing at least 3 bowel movements between 5 and 9 AM. He took a Dulcolax tablet later the same day which resulted in passing liquid stool followed by a few solid stools.  No bloody stools. He remains on Metamucil daily.  He takes Gas-X at bedtime.  He elected to record his bowel pattern as summarized below:  Wed 1/24: He did not take metamucil, passed a few small BMs  Thur 1/25: Has 3 liquid stools in the morning and 2 similar stools in the afternoon  Fri 1/26: He had several urges to pass a BM but did not   Sat 1/27: He passed a small bowel movement, took 2 Dulcolax which resulted in passing a large formed stool and several loose stools later in the afternoon  Monday 1/29: He passed 5 small near solid stools and took 2 Dulcolax at bedtime  Tuesday 1/30: He passed 4 bowel movements, 2 loose, 1 watery and 1 solid  Thursday 2/1, increased flatulence and passed 2 small stools  Friday 2/2: He passed 4 small stools  His most recent colonoscopy by Dr. Henrene Pastor was 02/22/2019 which identified one 5 mm polyp which was removed from the transverse colon and internal hemorrhoids. Biopsies of the polyp showed plant matter.  A repeat colonoscopy in 5 years was to be considered if medically appropriate.  As previously reviewed, he does not wish to pursue any further colon polyp surveillance colonoscopies.         Latest Ref Rng & Units 02/16/2022    7:35 AM 02/16/2021    8:59 AM 02/13/2020    7:42 AM  CBC  WBC 4.0 - 10.5 K/uL 13.3  8.0  7.5   Hemoglobin 13.0 - 17.0 g/dL 16.8  16.0  15.9   Hematocrit 39.0 - 52.0 % 48.3  45.5  46.3   Platelets 150.0 - 400.0 K/uL 264.0  221.0  237.0        Latest Ref Rng & Units 02/16/2022    7:35 AM 02/16/2021    8:59 AM 02/13/2020    7:42 AM  CMP  Glucose 70 - 99 mg/dL 102  110  103   BUN 6 - 23 mg/dL '18  15  22   '$ Creatinine 0.40 - 1.50 mg/dL 1.23  1.18  1.23   Sodium 135 - 145 mEq/L 143  141  142   Potassium 3.5 - 5.1 mEq/L 4.0  3.7  3.6   Chloride 96 - 112 mEq/L 102  104  103   CO2 19 - 32 mEq/L 32  29  31   Calcium 8.4 - 10.5 mg/dL 10.4  9.7  9.7   Total Protein 6.0 - 8.3 g/dL 6.7  6.4  6.7   Total Bilirubin 0.2 - 1.2 mg/dL 1.1  1.1  0.9   Alkaline Phos 39 - 117 U/L 74  71  56   AST 0 -  37 U/L '15  15  19   '$ ALT 0 - 53 U/L '20  21  24     '$ Current Medications, Allergies, Past Medical History, Past Surgical History, Family History and Social History were reviewed in [employer] record.  Review of Systems:   Constitutional: Negative for fever, sweats, chills or weight loss.  Respiratory: Negative for shortness of breath.   Cardiovascular: Negative for chest pain, palpitations and leg swelling.  Gastrointestinal: See HPI.  Musculoskeletal: Negative for back pain or muscle aches.  Neurological: Negative for dizziness, headaches or paresthesias.   Physical Exam: BP (!) 116/58   Pulse 64   Ht '5\' 10"'$  (1.778 m)   Wt 186 lb (84.4 kg)   BMI 26.69 kg/m   General: 77 year old male in no acute distress. Head: Normocephalic and atraumatic. Eyes: No scleral icterus. Conjunctiva pink . Lungs: Clear throughout to auscultation. Heart: Regular rate and rhythm, no murmur. Abdomen: Soft, nontender and nondistended. No masses or hepatomegaly. Normal bowel sounds x 4 quadrants.  Rectal: Deferred. Musculoskeletal: Symmetrical with  no gross deformities. Extremities: No edema. Neurological: Alert oriented x 4. No focal deficits.  Psychological: Alert and cooperative. Normal mood and affect  Assessment and Recommendations:  77 year old male with chronic constipation, variable bowel pattern on Metamucil and Linzess -Stop Metamucil and try Benefiber 1 tablespoon daily to bulk up stool -Continue Linzess 145 mcg 1 p.o. daily -Dietary fiber as tolerated -6 to 8 glasses water daily -Follow-up as needed

## 2022-04-08 NOTE — Progress Notes (Signed)
Noted  

## 2022-04-19 NOTE — Telephone Encounter (Signed)
Received notification from [facility] regarding approval for [place] from 04/07/2022 to 03/07/2023 AND WAS SHIP TO HOME ON 04/13/2022  Phone [phone]  [facility] Patient Advocate

## 2022-05-03 ENCOUNTER — Other Ambulatory Visit: Payer: Self-pay | Admitting: Internal Medicine

## 2022-05-03 DIAGNOSIS — G4700 Insomnia, unspecified: Secondary | ICD-10-CM

## 2022-05-10 ENCOUNTER — Telehealth: Payer: Self-pay

## 2022-05-10 NOTE — Progress Notes (Signed)
Care Management & Coordination Services Pharmacy Team  Reason for Encounter: Hypertension  Contacted patient to discuss hypertension disease state. Spoke with patient on 05/10/2022   Current antihypertensive regimen:  Amlodipine 10 mg daily Triamterene HCTZ 37.5-25 mg every morning Patient verbally confirms he is taking the above medications as directed. Yes  How often are you checking your Blood Pressure? Patient is checking his blood pressure very infrequently, he states he doesn't recall the last time he checked his blood pressure.  Patient was encouraged to start checking again. Patient agrees.   Current home BP readings: Patient states his previous blood pressures were around 120/68-150/85  Wrist or arm cuff: Arm cuff  OTC medications including pseudoephedrine or NSAIDs? Patient denies  Any readings above 180/100? Patient denies  What recent interventions/DTPs have been made by any provider to improve Blood Pressure control since last CPP Visit: No recent interventions  Any recent hospitalizations or ED visits since last visit with CPP? No recent hospital visits.   What diet changes have been made to improve Blood Pressure Control?  Patient eats a high fiber and low sodium diet Breakfast - patient will have eggs, cereal, hot cereal, toast, bagels. Lunch - patient will have a sandwich of some sort.  Dinner - patient will have a meal with meat, vegetables and starch Caffeine intake:coffee daily Salt intake: No added salt  What exercise is being done to improve your Blood Pressure Control?  Patient will go to a park and walk on nice days,  or up and down the stairs in his home.   Adherence Review: Is the patient currently on ACE/ARB medication? No Does the patient have >5 day gap between last estimated fill dates? No  Care Gaps: AWV - completed  Next appointment - 08/15/2022 Last BP - 116/58 on 04/08/2022 Covid - overdue   Star Rating Drug: Atorvastatin 40 mg  - last  filled 03/12/2022 100 DS at [facility]  Chart Updates: Recent office visits:  02/16/2022 [name] MD - Patient was seen for Encounter for preventive health examination and additional concerns. Discontinued Docusate, Lactase and Miralax.   Recent consult visits:  04/08/2022 [name] NP (GI) - Patient was seen for constipation. No medication changes.   03/22/2022 [name] NP (GI) - Patient was seen for constipation. Increased Linaclotide to 145 mcg daily before breakfast.   01/14/2022 Adline Potter (optometry) - Patient was seen for Other vitreous opacities, bilateral. No additional chart notes.   12/30/2021 [name] NP (GI) - Patient was seen for constipation. No medication changes.   Hospital visits:  None  Medications: Outpatient Encounter Medications as of 05/10/2022  Medication Sig   amLODipine (NORVASC) 10 MG tablet TAKE 1 TABLET BY MOUTH DAILY   atorvastatin (LIPITOR) 40 MG tablet TAKE 1 TABLET BY MOUTH DAILY   b complex vitamins tablet Take 1 tablet by mouth daily.   buPROPion (WELLBUTRIN XL) 150 MG 24 hr tablet TAKE 1 TABLET BY MOUTH DAILY   Cholecalciferol (VITAMIN D) 1000 UNITS capsule Take 1,000 Units by mouth daily.   fluticasone (FLONASE) 50 MCG/ACT nasal spray Place 1 spray into both nostrils daily. (Patient taking differently: Place 1 spray into both nostrils daily as needed.)   LINZESS 145 MCG CAPS capsule TAKE 1 CAPSULE (145 MCG TOTAL) BY MOUTH DAILY BEFORE BREAKFAST. TAKE 30 MINUTES BEFORE BREAKFAST   loratadine (CLARITIN) 10 MG tablet Take 10 mg by mouth daily as needed.   Omega-3 Fatty Acids (FISH OIL) 1000 MG CAPS Take 4 capsules  by mouth daily.   oxymetazoline (AFRIN) 0.05 % nasal spray Place 1 spray into both nostrils 2 (two) times daily as needed for congestion.   Probiotic Product (PROBIO DEFENSE PO) Take by mouth.   Simethicone 180 MG CAPS Take by mouth.   temazepam (RESTORIL) 15 MG capsule TAKE 1 CAPSULE BY  MOUTH AT BEDTIME AS NEEDED FOR SLEEP   triamterene-hydrochlorothiazide (MAXZIDE-25) 37.5-25 MG tablet TAKE 1 TABLET BY MOUTH IN THE  MORNING   Turmeric 500 MG TABS Take by mouth.   No facility-administered encounter medications on file as of 05/10/2022.  Fill History:  Dispensed Days Supply Quantity Provider Pharmacy  AMLODIPINE BESYLATE  10 MG TABS 03/12/2022 100 100 tablet      Dispensed Days Supply Quantity Provider Pharmacy  ATORVASTATIN CALCIUM  40 MG TABS 03/12/2022 100 100 tablet      Dispensed Days Supply Quantity Provider Pharmacy  BUPROPION HYDROCHLORIDE ER (XL)  150 MG TB24 03/12/2022 90 90 tablet      Dispensed Days Supply Quantity Provider Pharmacy  LINZESS 72 MCG PO CAPS 02/17/2021 90 90      Dispensed Days Supply Quantity Provider Pharmacy  TEMAZEPAM  15 MG CAPS 05/05/2022 90 90 capsule      Dispensed Days Supply Quantity Provider Pharmacy  TRIAMTERENE/HYDROCHLOROTHIAZIDE  37.5-25 MG TABS 03/12/2022 100 100 tablet      Recent Office Vitals: BP Readings from Last 3 Encounters:  04/08/22 (!) 116/58  03/22/22 122/72  02/16/22 130/80   Pulse Readings from Last 3 Encounters:  04/08/22 64  03/22/22 82  02/16/22 60    Wt Readings from Last 3 Encounters:  04/08/22 186 lb (84.4 kg)  03/22/22 189 lb (85.7 kg)  02/16/22 183 lb 12.8 oz (83.4 kg)     Kidney Function Lab Results  Component Value Date/Time   CREATININE 1.23 02/16/2022 07:35 AM   CREATININE 1.18 02/16/2021 08:59 AM   GFR 57.44 (L) 02/16/2022 07:35 AM   GFRNONAA 81 01/18/2008 08:23 AM   GFRAA 98 01/18/2008 08:23 AM       Latest Ref Rng & Units 02/16/2022    7:35 AM 02/16/2021    8:59 AM 02/13/2020    7:42 AM  BMP  Glucose 70 - 99 mg/dL 102  110  103   BUN 6 - 23 mg/dL '18  15  22   '$ Creatinine 0.40 - 1.50 mg/dL 1.23  1.18  1.23   Sodium 135 - 145 mEq/L 143  141  142   Potassium 3.5 - 5.1 mEq/L 4.0  3.7  3.6   Chloride 96 - 112 mEq/L 102  104  103   CO2 19 - 32 mEq/L 32  29  31   Calcium 8.4 -  10.5 mg/dL 10.4  9.7  9.7    [place] Production designer, theatre/television/film [phone]

## 2022-05-12 ENCOUNTER — Other Ambulatory Visit (HOSPITAL_COMMUNITY): Payer: Self-pay

## 2022-05-13 ENCOUNTER — Other Ambulatory Visit: Payer: Self-pay | Admitting: Internal Medicine

## 2022-05-13 NOTE — Telephone Encounter (Signed)
Faxed full [employer] application to [phone]. Please complete provider section and fax full application to [employer] once completed and to me so that it can be scanned to patients chart. Thanks!

## 2022-05-20 ENCOUNTER — Other Ambulatory Visit: Payer: Self-pay | Admitting: Internal Medicine

## 2022-05-20 DIAGNOSIS — I10 Essential (primary) hypertension: Secondary | ICD-10-CM

## 2022-06-15 ENCOUNTER — Ambulatory Visit: Payer: Medicare Other | Admitting: Internal Medicine

## 2022-06-15 ENCOUNTER — Other Ambulatory Visit: Payer: Self-pay

## 2022-06-15 ENCOUNTER — Emergency Department (HOSPITAL_BASED_OUTPATIENT_CLINIC_OR_DEPARTMENT_OTHER)
Admission: EM | Admit: 2022-06-15 | Discharge: 2022-06-15 | Disposition: A | Discharge status: Home / Self Care | Payer: Medicare Other | Attending: Emergency Medicine | Admitting: Emergency Medicine

## 2022-06-15 DIAGNOSIS — K644 Residual hemorrhoidal skin tags: Secondary | ICD-10-CM | POA: Insufficient documentation

## 2022-06-15 DIAGNOSIS — K625 Hemorrhage of anus and rectum: Secondary | ICD-10-CM | POA: Diagnosis present

## 2022-06-15 DIAGNOSIS — R197 Diarrhea, unspecified: Secondary | ICD-10-CM | POA: Insufficient documentation

## 2022-06-15 MED ORDER — HYDROCORTISONE (PERIANAL) 2.5 % EX CREA: 1.0000 | TOPICAL_CREAM | Freq: Two times a day (BID) | CUTANEOUS | 0 refills | Status: AC

## 2022-06-15 NOTE — ED Triage Notes (Signed)
Pt caox4 ambulatory c/o "rectum protruding" since yesterday. Pt denies hx hemorrhoids. Pt denies any rectal bleeding. States "some discomfort but not painful" in the rectum.

## 2022-06-15 NOTE — ED Provider Notes (Signed)
[facility] AT [facility] Provider Note   CSN: [id number] Arrival date & time: 06/15/22  5974     History  Chief Complaint  Patient presents with   Rectal Swelling    Richard Sparks is a 77 y.o. male.  Patient presents with feeling rectum protruding since yesterday.  No history of hemorrhoids or rectal prolapse.  Patient has softer stools and multiple diarrhea episodes over the past year.  Patient is followed by gastroenterologist.  Patient had polyps removed on colonoscopy in the past.  No blood in the stools.  No weight loss.  No fevers chills or vomiting.       Home Medications Prior to Admission medications   Medication Sig Start Date End Date Taking? Authorizing Provider  hydrocortisone (ANUSOL-HC) 2.5 % rectal cream Place 1 Application rectally 2 (two) times daily for 7 days. 06/15/22 06/22/22 Yes Blane Ohara, MD  amLODipine (NORVASC) 10 MG tablet TAKE 1 TABLET BY MOUTH DAILY 05/23/22   Philip Aspen, Limmie Patricia, MD  atorvastatin (LIPITOR) 40 MG tablet TAKE 1 TABLET BY MOUTH DAILY 05/23/22   Philip Aspen, Limmie Patricia, MD  b complex vitamins tablet Take 1 tablet by mouth daily.    [provider]  buPROPion (WELLBUTRIN XL) 150 MG 24 hr tablet TAKE 1 TABLET BY MOUTH DAILY 05/13/22   Philip Aspen, Limmie Patricia, MD  Cholecalciferol (VITAMIN D) 1000 UNITS capsule Take 1,000 Units by mouth daily.    [provider]  fluticasone (FLONASE) 50 MCG/ACT nasal spray Place 1 spray into both nostrils daily. Patient taking differently: Place 1 spray into both nostrils daily as needed. 11/20/17   Gordy Savers, MD  LINZESS 145 MCG CAPS capsule TAKE 1 CAPSULE (145 MCG TOTAL) BY MOUTH DAILY BEFORE BREAKFAST. TAKE 30 MINUTES BEFORE BREAKFAST 03/22/22   Arnaldo Natal, NP  loratadine (CLARITIN) 10 MG tablet Take 10 mg by mouth daily as needed.    [provider]  Omega-3 Fatty Acids (FISH OIL) 1000 MG CAPS Take 4 capsules by  mouth daily.    [provider]  oxymetazoline (AFRIN) 0.05 % nasal spray Place 1 spray into both nostrils 2 (two) times daily as needed for congestion.    [provider]  Probiotic Product (PROBIO DEFENSE PO) Take by mouth.    [provider]  Simethicone 180 MG CAPS Take by mouth.    [provider]  temazepam (RESTORIL) 15 MG capsule TAKE 1 CAPSULE BY MOUTH AT BEDTIME AS NEEDED FOR SLEEP 05/04/22   Philip Aspen, Limmie Patricia, MD  triamterene-hydrochlorothiazide [facility]) 37.5-25 MG tablet TAKE 1 TABLET BY MOUTH IN THE  MORNING 05/23/22   Philip Aspen, Limmie Patricia, MD  Turmeric 500 MG TABS Take by mouth.    [provider]      Allergies    Amoxicillin    Review of Systems   Review of Systems  Constitutional:  Negative for chills and fever.  HENT:  Negative for congestion.   Eyes:  Negative for visual disturbance.  Respiratory:  Negative for shortness of breath.   Cardiovascular:  Negative for chest pain.  Gastrointestinal:  Positive for diarrhea. Negative for abdominal pain and vomiting.  Genitourinary:  Negative for dysuria and flank pain.  Musculoskeletal:  Negative for back pain, neck pain and neck stiffness.  Skin:  Negative for rash.  Neurological:  Negative for light-headedness and headaches.    Physical Exam Updated Vital Signs BP (!) 150/84   Pulse 69  Temp 97.9 F (36.6 C) (Oral)   Resp 16   SpO2 99%  Physical Exam Vitals and nursing note reviewed.  Constitutional:      General: He is not in acute distress.    Appearance: He is well-developed.  HENT:     Head: Normocephalic and atraumatic.     Mouth/Throat:     Mouth: Mucous membranes are moist.  Eyes:     General:        Right eye: No discharge.        Left eye: No discharge.     Conjunctiva/sclera: Conjunctivae normal.  Neck:     Trachea: No tracheal deviation.  Cardiovascular:     Rate and Rhythm: Normal rate.  Pulmonary:     Effort: Pulmonary effort  is normal.  Abdominal:     General: There is no distension.     Palpations: Abdomen is soft.     Tenderness: There is no abdominal tenderness. There is no guarding.  Genitourinary:    Comments: Patient has hemorrhoid approximately 2 cm in length, no significant tenderness, no signs of infection.  Normal rectal tone. Musculoskeletal:     Cervical back: Normal range of motion and neck supple. No rigidity.  Skin:    General: Skin is warm.     Capillary Refill: Capillary refill takes less than 2 seconds.     Findings: No rash.  Neurological:     General: No focal deficit present.     Mental Status: He is alert.     Cranial Nerves: No cranial nerve deficit.  Psychiatric:        Mood and Affect: Mood normal.     ED Results / Procedures / Treatments   Labs (all labs ordered are listed, but only abnormal results are displayed) Labs Reviewed - No data to display  EKG None  Radiology No results found.  Procedures Procedures    Medications Ordered in ED Medications - No data to display  ED Course/ Medical Decision Making/ A&P                             Medical Decision Making Risk Prescription drug management.   Patient presents with clinical concern for external hemorrhoid.  Patient well-appearing otherwise.  No indication for IV fluid or imaging.  Discussed topical steroids and outpatient follow-up with gastroenterologist.  Prescription sent to pharmacy.  Patient comfortable plan.        Final Clinical Impression(s) / ED Diagnoses Final diagnoses:  External hemorrhoid    Rx / DC Orders ED Discharge Orders          Ordered    hydrocortisone (ANUSOL-HC) 2.5 % rectal cream  2 times daily        06/15/22 1042              Blane Ohara, MD 06/15/22 1043

## 2022-06-15 NOTE — Discharge Instructions (Signed)
Use topical steroid cream as directed.  Follow-up with your local doctor or gastroenterologist if no improvement next week.

## 2022-06-15 NOTE — ED Notes (Signed)
Pt discharged to home using teachback Method. Discharge instructions have been discussed with patient and/or family members. Pt verbally acknowledges understanding d/c instructions, has been given opportunity for questions to be answered, and endorses comprehension to checkout at registration before leaving.  

## 2022-06-20 ENCOUNTER — Telehealth: Payer: Self-pay

## 2022-06-20 NOTE — Telephone Encounter (Signed)
        Patient  visited [facility] on 4/10    Telephone encounter attempt :  1st  A HIPAA compliant voice message was left requesting a return call.  Instructed patient to call back.    [facility], [facility] [phone] [address] [place], [place], [place] [address] Phone: [phone] Email: [email or web address]

## 2022-08-10 NOTE — Progress Notes (Signed)
Care Management & Coordination Services Pharmacy Note  08/10/2022 Name:  Richard Sparks MRN:  [id number] DOB:  May 05, 1945  Summary: BP usually at goal <140/90, occasional spikes into the 140s LDL at goal <70   Recommendations/Changes made from today's visit: -Counseled to check BP at least once weekly and keep a log.  -Counseled to be mindful of sodium intake -Counseled on cholesterol-lowering diet and exercise  Follow up plan: BP call in 3 months Pharmacist visit in 6 months   Subjective: Richard Sparks is an 77 y.o. year old male who is a primary patient of Philip Aspen, Limmie Patricia, MD.  The care coordination team was consulted for assistance with disease management and care coordination needs.    Engaged with patient by telephone for follow up visit.  Recent office visits: 02/16/2022 Chaya Jan MD - Patient was seen for Encounter for preventive health examination and additional concerns. Discontinued Docusate, Lactase and Miralax.   Recent consult visits: 04/08/2022 Alcide Evener NP (GI) - Patient was seen for constipation. No medication changes.    03/22/2022 Alcide Evener NP (GI) - Patient was seen for constipation. Increased Linaclotide to 145 mcg daily before breakfast.    01/14/2022 Talmage Coin (optometry) - Patient was seen for Other vitreous opacities, bilateral. No additional chart notes.   Hospital visits: 06/15/22 [facility] [place] - For external hemorrhoid and rectal swelling - Start hydrocortisone 2% cream rectally   Objective:  Lab Results  Component Value Date   CREATININE 1.23 02/16/2022   BUN 18 02/16/2022   GFR 57.44 (L) 02/16/2022   GFRNONAA 81 01/18/2008   GFRAA 98 01/18/2008   NA 143 02/16/2022   K 4.0 02/16/2022   CALCIUM 10.4 02/16/2022   CO2 32 02/16/2022   GLUCOSE 102 (H) 02/16/2022    Lab Results  Component Value Date/Time   HGBA1C 5.8 02/16/2022 07:35 AM   HGBA1C 5.8 02/16/2021 08:59 AM    GFR 57.44 (L) 02/16/2022 07:35 AM   GFR 60.79 02/16/2021 08:59 AM    Last diabetic Eye exam: No results found for: "HMDIABEYEEXA"  Last diabetic Foot exam: No results found for: "HMDIABFOOTEX"   Lab Results  Component Value Date   CHOL 142 02/16/2022   HDL 42.50 02/16/2022   LDLCALC 69 02/16/2022   LDLDIRECT 112.0 08/13/2020   TRIG 152.0 (H) 02/16/2022   CHOLHDL 3 02/16/2022       Latest Ref Rng & Units 02/16/2022    7:35 AM 02/16/2021    8:59 AM 02/13/2020    7:42 AM  Hepatic Function  Total Protein 6.0 - 8.3 g/dL 6.7  6.4  6.7   Albumin 3.5 - 5.2 g/dL 4.5  4.3  4.4   AST 0 - 37 U/L 15  15  19    ALT 0 - 53 U/L 20  21  24    Alk Phosphatase 39 - 117 U/L 74  71  56   Total Bilirubin 0.2 - 1.2 mg/dL 1.1  1.1  0.9     Lab Results  Component Value Date/Time   TSH 2.31 02/16/2022 07:35 AM   TSH 1.34 02/16/2021 08:59 AM       Latest Ref Rng & Units 02/16/2022    7:35 AM 02/16/2021    8:59 AM 02/13/2020    7:42 AM  CBC  WBC 4.0 - 10.5 K/uL 13.3  8.0  7.5   Hemoglobin 13.0 - 17.0 g/dL 84.6  96.2  95.2   Hematocrit 39.0 - 52.0 % 48.3  45.5  46.3   Platelets 150.0 - 400.0 K/uL 264.0  221.0  237.0     Lab Results  Component Value Date/Time   VD25OH 51.28 02/16/2021 08:59 AM   VD25OH 44.50 02/13/2020 07:42 AM   VITAMINB12 511 02/16/2021 08:59 AM   VITAMINB12 455 02/13/2020 07:42 AM    Clinical ASCVD: No  The 10-year ASCVD risk score (Arnett DK, et al., 2019) is: 34.1%   Values used to calculate the score:     Age: 8 years     Sex: Male     Is Non-Hispanic African American: No     Diabetic: No     Tobacco smoker: No     Systolic Blood Pressure: 150 mmHg     Is BP treated: Yes     HDL Cholesterol: 42.5 mg/dL     Total Cholesterol: 142 mg/dL       16/12/9602    5:40 AM 02/16/2021    8:02 AM 02/16/2021    8:01 AM  Depression screen PHQ 2/9  Decreased Interest 1 0 0  Down, Depressed, Hopeless 0 0 0  PHQ - 2 Score 1 0 0  Altered sleeping 3 0 0  Tired,  decreased energy 1 0 0  Change in appetite 0 0 0  Feeling bad or failure about yourself  1 0 0  Trouble concentrating 1 0 0  Moving slowly or fidgety/restless 0 0 0  Suicidal thoughts 0 0 0  PHQ-9 Score 7 0 0  Difficult doing work/chores Not difficult at all Not difficult at all Not difficult at all     Social History   Tobacco Use  Smoking Status Never  Smokeless Tobacco Never   BP Readings from Last 3 Encounters:  06/15/22 (!) 150/84  04/08/22 (!) 116/58  03/22/22 122/72   Pulse Readings from Last 3 Encounters:  06/15/22 69  04/08/22 64  03/22/22 82   Wt Readings from Last 3 Encounters:  04/08/22 186 lb (84.4 kg)  03/22/22 189 lb (85.7 kg)  02/16/22 183 lb 12.8 oz (83.4 kg)   BMI Readings from Last 3 Encounters:  04/08/22 26.69 kg/m  03/22/22 27.12 kg/m  02/16/22 26.00 kg/m    Allergies  Allergen Reactions   Amoxicillin Rash    Medications Reviewed Today     Reviewed by Arnaldo Natal, NP (Nurse Practitioner) on 04/08/22 at 1320  Med List Status: <None>   Medication Order Taking? Sig Documenting Provider Last Dose Status Informant  amLODipine (NORVASC) 10 MG tablet [id number] Yes TAKE 1 TABLET BY MOUTH DAILY Philip Aspen, Limmie Patricia, MD Taking Active   atorvastatin (LIPITOR) 40 MG tablet [id number] Yes TAKE 1 TABLET BY MOUTH DAILY Philip Aspen, Limmie Patricia, MD Taking Active   b complex vitamins tablet [id number] Yes Take 1 tablet by mouth daily. [provider] Taking Active   buPROPion (WELLBUTRIN XL) 150 MG 24 hr tablet [id number] Yes TAKE 1 TABLET BY MOUTH DAILY Philip Aspen, Limmie Patricia, MD Taking Active   Cholecalciferol (VITAMIN D) 1000 UNITS capsule [id number] Yes Take 1,000 Units by mouth daily. [provider] Taking Active   fluticasone (FLONASE) 50 MCG/ACT nasal spray [id number] Yes Place 1 spray into both nostrils daily.  Patient taking differently: Place 1 spray into both nostrils daily as needed.   Gordy Savers,  MD Taking Active   LINZESS 145 MCG CAPS capsule [id number] Yes TAKE 1 CAPSULE (145 MCG TOTAL) BY MOUTH DAILY BEFORE BREAKFAST. TAKE 30 MINUTES BEFORE BREAKFAST Arnaldo Natal, NP  Taking Active   loratadine (CLARITIN) 10 MG tablet [id number] Yes Take 10 mg by mouth daily as needed. [provider] Taking Active   Omega-3 Fatty Acids (FISH OIL) 1000 MG CAPS [id number] Yes Take 4 capsules by mouth daily. [provider] Taking Active   oxymetazoline (AFRIN) 0.05 % nasal spray [id number] Yes Place 1 spray into both nostrils 2 (two) times daily as needed for congestion. [provider] Taking Active   Probiotic Product (PROBIO DEFENSE PO) [id number] Yes Take by mouth. [provider] Taking Active   Simethicone 180 MG CAPS [id number] Yes Take by mouth. [provider] Taking Active   temazepam (RESTORIL) 15 MG capsule [id number] Yes TAKE 1 CAPSULE BY MOUTH AT BEDTIME AS NEEDED FOR SLEEP Philip Aspen, Limmie Patricia, MD Taking Active   triamterene-hydrochlorothiazide [facility]) 37.5-25 MG tablet [id number] Yes TAKE 1 TABLET BY MOUTH IN THE  MORNING Philip Aspen, Limmie Patricia, MD Taking Active   Turmeric 500 MG TABS [id number] Yes Take by mouth. [provider] Taking Active             SDOH:  (Social Determinants of Health) assessments and interventions performed: Yes SDOH Interventions    Flowsheet Row Chronic Care Management from 11/05/2021 in [facility] [place] Office Visit from 11/10/2015 in [facility] [place]  SDOH Interventions    Depression Interventions/Treatment  -- Currently on Treatment  [Pt see a Therapist, sports and is on medication]  Financial Strain Interventions Intervention Not Indicated --       Medication Assistance: None required.  Patient affirms current coverage meets needs.  Medication Access: Name and location of current pharmacy:  [id number] [place] [place] -  [address] [place] [address] Phone: [phone] Fax: [phone]  [facility]) - [place], [place] - [address] [facility] [address] [place] [address] Phone: [phone] Fax: [phone]  [facility] Delivery - [place], [place] - [address] [place] [address] Phone: [phone] Fax: [phone]  Within the past 30 days, how often has patient missed a dose of medication? None Is a pillbox or other method used to improve adherence? No  Factors that may affect medication adherence? no barriers identified Are meds synced by current pharmacy? No  Are meds delivered by current pharmacy? No  Does patient experience delays in picking up medications due to transportation concerns? No   Compliance/Adherence/Medication fill history: Care Gaps: COVID Booster -  last 06/14/19  Star-Rating Drugs: Atorvastatin 40mg  PDC 99%   Assessment/Plan Hypertension (BP goal <130/80) -Not ideally controlled -Current treatment: Amlodipine 10mg  1 qd with lunch Appropriate, Effective, Safe, Accessible Triamterene-HCTZ 37.5-25mg  1 qam Appropriate, Effective, Safe, Accessible -Medications previously tried: None  -Current home readings: 140s/70s in left arm pm, 120/70 something in right arm am - checking infrequently, not sure if machine is accurate -Current dietary habits: mindful of salt intake -Current exercise habits: not discussed -Denies hypotensive/hypertensive symptoms -Educated on BP goals and benefits of medications for prevention of heart attack, stroke and kidney damage; Daily salt intake goal < 2300 mg; Importance of home blood pressure monitoring; Proper BP monitoring technique; -Counseled to monitor BP at home daily, document, and provide log at future appointments -Counseled on diet and exercise extensively Recommended to continue current  medication  Hyperlipidemia: (LDL goal < 100) -Controlled -Current treatment: Atorvastatin 40mg  1 qd -Medications previously tried: Pravastatin  -  Current dietary patterns: mindful of fried foods -Current exercise habits: goes to gym on regular basis for at least 30 min -Educated on Cholesterol goals;  Benefits of statin for ASCVD risk reduction; Importance of limiting foods high in cholesterol; Exercise goal of 150 minutes per week; -Counseled on diet and exercise extensively Recommended to continue current medication  Sherrill Raring Clinical Pharmacist [phone]

## 2022-08-12 ENCOUNTER — Telehealth: Payer: Self-pay

## 2022-08-12 NOTE — Progress Notes (Signed)
Care Management & Coordination Services Pharmacy Team  Reason for Encounter: Appointment Reminder  Contacted patient to confirm telephone appointment with Delano Metz, PharmD on 08/15/2022 at 2:30. Unsuccessful outreach. Left voicemail for patient to return call.  Care Gaps: AWV - completed 02/17/2023 Last BP - 116/58 on 04/08/2022 Covid - overdue   Star Rating Drug: Atorvastatin 40 mg  - last filled 06/12/2022 100 DS at [facility]  Clinical Pharmacist Assistant [phone]

## 2022-08-15 ENCOUNTER — Ambulatory Visit: Payer: Medicare Other

## 2022-08-17 ENCOUNTER — Encounter: Payer: Self-pay | Admitting: Nurse Practitioner

## 2022-08-17 ENCOUNTER — Ambulatory Visit: Payer: Medicare Other | Admitting: Nurse Practitioner

## 2022-08-17 VITALS — BP 120/60 | HR 64 | Ht 70.0 in | Wt 186.1 lb

## 2022-08-17 DIAGNOSIS — K5900 Constipation, unspecified: Secondary | ICD-10-CM

## 2022-08-17 NOTE — Progress Notes (Signed)
Noted  

## 2022-08-17 NOTE — Patient Instructions (Addendum)
Linzess 72 mcg- one tablet in the morning every Monday, Wednesday & Friday  Linzess 145 mcg- take 1 tablet in the morning every Sunday, Tuesday, Thursday & Saturday  Follow up as needed.  Due to recent changes in healthcare laws, you may see the results of your imaging and laboratory studies on MyChart before your provider has had a chance to review them.  We understand that in some cases there may be results that are confusing or concerning to you. Not all laboratory results come back in the same time frame and the provider may be waiting for multiple results in order to interpret others.  Please give [place] 48 hours in order for your provider to thoroughly review all the results before contacting the office for clarification of your results.   Thank you for trusting me with your gastrointestinal care!   Alcide Evener, CRNP

## 2022-08-17 NOTE — Progress Notes (Signed)
08/17/2022 Richard Sparks [id number] Jul 22, 1945   Chief Complaint: Constipation follow-up  History of Present Illness: Richard Sparks is a past medical history of depression, hypertension, hyperlipidemia, BPH, chronic constipation and colon polyps.  He remains on Linzess 145 mcg daily and Benefiber 1 tablespoon daily which results in passing 3-4 nonbloody loose mud like stools daily.  His constipation did not improve when he previously took Linzess 72 mcg daily.  He is mildly concerned that he is passing multiple loose stools on Linzess.  No abdominal or rectal pain.  Appetite is good.  No weight loss.  He endorsed having mild hemorrhoidal swelling without pain or bleeding and he presented to the ED 06/15/2022 for further evaluation.  He was assessed to have a nonthrombosed external hemorrhoid and was prescribed Anusol HC 2.5% cream.  He denies having any further hemorrhoidal swelling or irritation.  His most recent colonoscopy by Dr. Marina Goodell was 02/22/2019 which identified one 5 mm polyp which was removed from the transverse colon and internal hemorrhoids. Biopsies of the polyp showed plant matter.  A repeat colonoscopy in 5 years was to be considered if medically appropriate.  As previously reviewed, he does not wish to pursue any further colon polyp surveillance colonoscopies.       Latest Ref Rng & Units 02/16/2022    7:35 AM 02/16/2021    8:59 AM 02/13/2020    7:42 AM  CBC  WBC 4.0 - 10.5 K/uL 13.3  8.0  7.5   Hemoglobin 13.0 - 17.0 g/dL 56.3  87.5  64.3   Hematocrit 39.0 - 52.0 % 48.3  45.5  46.3   Platelets 150.0 - 400.0 K/uL 264.0  221.0  237.0         Latest Ref Rng & Units 02/16/2022    7:35 AM 02/16/2021    8:59 AM 02/13/2020    7:42 AM  CMP  Glucose 70 - 99 mg/dL 329  518  841   BUN 6 - 23 mg/dL 18  15  22    Creatinine 0.40 - 1.50 mg/dL 6.60  6.30  1.60   Sodium 135 - 145 mEq/L 143  141  142   Potassium 3.5 - 5.1 mEq/L 4.0  3.7  3.6   Chloride 96 - 112 mEq/L 102  104   103   CO2 19 - 32 mEq/L 32  29  31   Calcium 8.4 - 10.5 mg/dL 10.9  9.7  9.7   Total Protein 6.0 - 8.3 g/dL 6.7  6.4  6.7   Total Bilirubin 0.2 - 1.2 mg/dL 1.1  1.1  0.9   Alkaline Phos 39 - 117 U/L 74  71  56   AST 0 - 37 U/L 15  15  19    ALT 0 - 53 U/L 20  21  24      Current Outpatient Medications on File Prior to Visit  Medication Sig Dispense Refill   amLODipine (NORVASC) 10 MG tablet TAKE 1 TABLET BY MOUTH DAILY 100 tablet 2   atorvastatin (LIPITOR) 40 MG tablet TAKE 1 TABLET BY MOUTH DAILY 100 tablet 2   b complex vitamins tablet Take 1 tablet by mouth daily.     buPROPion (WELLBUTRIN XL) 150 MG 24 hr tablet TAKE 1 TABLET BY MOUTH DAILY 90 tablet 2   Cholecalciferol (VITAMIN D) 1000 UNITS capsule Take 1,000 Units by mouth daily.     fluticasone (FLONASE) 50 MCG/ACT nasal spray Place 1 spray into both nostrils daily. (Patient taking  differently: Place 1 spray into both nostrils daily as needed.) 16 g 5   LINZESS 145 MCG CAPS capsule TAKE 1 CAPSULE (145 MCG TOTAL) BY MOUTH DAILY BEFORE BREAKFAST. TAKE 30 MINUTES BEFORE BREAKFAST 30 capsule 3   loratadine (CLARITIN) 10 MG tablet Take 10 mg by mouth daily as needed.     Omega-3 Fatty Acids (FISH OIL) 1000 MG CAPS Take 4 capsules by mouth daily.     oxymetazoline (AFRIN) 0.05 % nasal spray Place 1 spray into both nostrils 2 (two) times daily as needed for congestion.     Probiotic Product (PROBIO DEFENSE PO) Take by mouth.     Simethicone 180 MG CAPS Take by mouth.     temazepam (RESTORIL) 15 MG capsule TAKE 1 CAPSULE BY MOUTH AT BEDTIME AS NEEDED FOR SLEEP 90 capsule 0   triamterene-hydrochlorothiazide (MAXZIDE-25) 37.5-25 MG tablet TAKE 1 TABLET BY MOUTH IN THE  MORNING 100 tablet 2   Turmeric 500 MG TABS Take by mouth.     No current facility-administered medications on file prior to visit.   Allergies  Allergen Reactions   Amoxicillin Rash   Current Medications, Allergies, Past Medical History, Past Surgical History, Family  History and Social History were reviewed in [employer] record.  Review of Systems:   Constitutional: Negative for fever, sweats, chills or weight loss.  Respiratory: Negative for shortness of breath.   Cardiovascular: Negative for chest pain, palpitations and leg swelling.  Gastrointestinal: See HPI.  Musculoskeletal: Negative for back pain or muscle aches.  Neurological: Negative for dizziness, headaches or paresthesias.   Physical Exam: BP 120/60 (BP Location: Left Arm, Patient Position: Sitting, Cuff Size: Normal)   Pulse 64 Comment: irregular  Ht 5\' 10"  (1.778 m)   Wt 186 lb 2 oz (84.4 kg)   BMI 26.71 kg/m   Wt Readings from Last 3 Encounters:  08/17/22 186 lb 2 oz (84.4 kg)  04/08/22 186 lb (84.4 kg)  03/22/22 189 lb (85.7 kg)     General: 77 year old male in no acute distress. Head: Normocephalic and atraumatic. Eyes: No scleral icterus. Conjunctiva pink . Ears: Normal auditory acuity. Lungs: Clear throughout to auscultation. Heart: Regular rate and rhythm, no murmur. Abdomen: Soft, nontender and nondistended. No masses or hepatomegaly. Normal bowel sounds x 4 quadrants.  Rectal: Deferred. Musculoskeletal: Symmetrical with no gross deformities. Extremities: No edema. Neurological: Alert oriented x 4. No focal deficits.  Psychological: Alert and cooperative. Normal mood and affect  Assessment and Recommendations:  77 year old male with chronic constipation, passing 3-4 loose stools on Linzess 145 mcg daily.  No bloody stools.  No abdominal or anorectal pain. -Continue Benefiber 1 tablespoon daily -Reduce Linzess as follows: Take Linzess 72 mcg 1 tab every Monday-Wednesday-Friday.  Take Linzess 145 mcg 1 tab every Sunday-Tuesday-Thursday-Saturday -Fiber diet as tolerated -Follow-up as needed

## 2022-09-22 NOTE — Telephone Encounter (Signed)
Richard Sparks, Patient can take Linzess per his note below, that is fine.

## 2022-10-31 ENCOUNTER — Other Ambulatory Visit: Payer: Self-pay | Admitting: Internal Medicine

## 2022-10-31 DIAGNOSIS — G4700 Insomnia, unspecified: Secondary | ICD-10-CM

## 2022-12-28 ENCOUNTER — Ambulatory Visit: Payer: Medicare Other | Admitting: Internal Medicine

## 2022-12-28 ENCOUNTER — Encounter: Payer: Self-pay | Admitting: Internal Medicine

## 2022-12-28 VITALS — BP 120/80 | HR 70 | Temp 98.2°F | Wt 185.2 lb

## 2022-12-28 DIAGNOSIS — Z23 Encounter for immunization: Secondary | ICD-10-CM

## 2022-12-28 DIAGNOSIS — M79644 Pain in right finger(s): Secondary | ICD-10-CM | POA: Diagnosis not present

## 2022-12-28 MED ORDER — MELOXICAM 7.5 MG PO TABS: 7.5000 mg | ORAL_TABLET | Freq: Every day | ORAL | 0 refills | Status: DC

## 2022-12-28 NOTE — Progress Notes (Signed)
Established Patient Office Visit     CC/Reason for Visit: Right thumb pain  HPI: Richard Sparks is a 77 y.o. male who is coming in today for the above mentioned reasons.  For the past few months has been complaining of pain in his right hand.  It is more on the thenar eminence and the actual thumb joint.  No weakness or limitation in range of movement.  Past Medical/Surgical History: Past Medical History:  Diagnosis Date   Allergy    BENIGN PROSTATIC HYPERTROPHY 08/01/2006   DEPRESSION 08/01/2006   HYPERLIPIDEMIA 08/01/2006   HYPERTENSION 08/01/2006    Past Surgical History:  Procedure Laterality Date   COLONOSCOPY  last 12/31/2013   NO PAST SURGERIES      Social History:  reports that he has never smoked. He has never used smokeless tobacco. He reports that he does not drink alcohol and does not use drugs.  Allergies: Allergies  Allergen Reactions   Amoxicillin Rash    Family History:  Family History  Problem Relation Age of Onset   Colon cancer Neg Hx    Esophageal cancer Neg Hx    Rectal cancer Neg Hx    Stomach cancer Neg Hx    Colon polyps Neg Hx      Current Outpatient Medications:    amLODipine (NORVASC) 10 MG tablet, TAKE 1 TABLET BY MOUTH DAILY, Disp: 100 tablet, Rfl: 2   atorvastatin (LIPITOR) 40 MG tablet, TAKE 1 TABLET BY MOUTH DAILY, Disp: 100 tablet, Rfl: 2   b complex vitamins tablet, Take 1 tablet by mouth daily., Disp: , Rfl:    buPROPion (WELLBUTRIN XL) 150 MG 24 hr tablet, TAKE 1 TABLET BY MOUTH DAILY, Disp: 90 tablet, Rfl: 2   Cholecalciferol (VITAMIN D) 1000 UNITS capsule, Take 1,000 Units by mouth daily., Disp: , Rfl:    fluticasone (FLONASE) 50 MCG/ACT nasal spray, Place 1 spray into both nostrils daily. (Patient taking differently: Place 1 spray into both nostrils daily as needed.), Disp: 16 g, Rfl: 5   LINZESS 145 MCG CAPS capsule, TAKE 1 CAPSULE (145 MCG TOTAL) BY MOUTH DAILY BEFORE BREAKFAST. TAKE 30 MINUTES BEFORE BREAKFAST, Disp: 30  capsule, Rfl: 3   loratadine (CLARITIN) 10 MG tablet, Take 10 mg by mouth daily as needed., Disp: , Rfl:    meloxicam (MOBIC) 7.5 MG tablet, Take 1 tablet (7.5 mg total) by mouth daily., Disp: 30 tablet, Rfl: 0   Omega-3 Fatty Acids (FISH OIL) 1000 MG CAPS, Take 4 capsules by mouth daily., Disp: , Rfl:    oxymetazoline (AFRIN) 0.05 % nasal spray, Place 1 spray into both nostrils 2 (two) times daily as needed for congestion., Disp: , Rfl:    Probiotic Product (PROBIO DEFENSE PO), Take by mouth., Disp: , Rfl:    Simethicone 180 MG CAPS, Take by mouth., Disp: , Rfl:    temazepam (RESTORIL) 15 MG capsule, TAKE 1 CAPSULE BY MOUTH AT BEDTIME AS NEEDED FOR SLEEP, Disp: 90 capsule, Rfl: 0   triamterene-hydrochlorothiazide (MAXZIDE-25) 37.5-25 MG tablet, TAKE 1 TABLET BY MOUTH IN THE  MORNING, Disp: 100 tablet, Rfl: 2   Turmeric 500 MG TABS, Take by mouth., Disp: , Rfl:   Review of Systems:  Negative unless indicated in HPI.   Physical Exam: Vitals:   12/28/22 1556  BP: 120/80  Pulse: 70  Temp: 98.2 F (36.8 C)  TempSrc: Oral  SpO2: 96%  Weight: 185 lb 3.2 oz (84 kg)    Body mass index  is 26.57 kg/m.   Physical Exam Musculoskeletal:     Right hand: No swelling, deformity, lacerations or tenderness. Normal range of motion.     Left hand: Normal.      Impression and Plan:  Pain of right thumb -     Meloxicam; Take 1 tablet (7.5 mg total) by mouth daily.  Dispense: 30 tablet; Refill: 0  Immunization due -     Flu Vaccine Trivalent High Dose (Fluad)   -Etiology of pain unclear, however seems to be arising more from the thenar eminence than the thumb itself.  He will ice and use a thumb brace and will send meloxicam for 10 days.  Time spent:31 minutes reviewing chart, interviewing and examining patient and formulating plan of care.     Chaya Jan, MD [facility]

## 2023-01-24 DIAGNOSIS — H43393 Other vitreous opacities, bilateral: Secondary | ICD-10-CM | POA: Diagnosis not present

## 2023-02-14 ENCOUNTER — Other Ambulatory Visit: Payer: Self-pay | Admitting: Internal Medicine

## 2023-02-20 ENCOUNTER — Encounter: Payer: Self-pay | Admitting: Internal Medicine

## 2023-02-20 ENCOUNTER — Ambulatory Visit (INDEPENDENT_AMBULATORY_CARE_PROVIDER_SITE_OTHER): Payer: Medicare Other | Admitting: Internal Medicine

## 2023-02-20 VITALS — BP 130/80 | HR 72 | Temp 97.7°F | Ht 70.5 in | Wt 185.5 lb

## 2023-02-20 DIAGNOSIS — E785 Hyperlipidemia, unspecified: Secondary | ICD-10-CM

## 2023-02-20 DIAGNOSIS — I10 Essential (primary) hypertension: Secondary | ICD-10-CM | POA: Diagnosis not present

## 2023-02-20 DIAGNOSIS — Z0000 Encounter for general adult medical examination without abnormal findings: Secondary | ICD-10-CM | POA: Diagnosis not present

## 2023-02-20 DIAGNOSIS — R972 Elevated prostate specific antigen [PSA]: Secondary | ICD-10-CM | POA: Insufficient documentation

## 2023-02-20 DIAGNOSIS — N400 Enlarged prostate without lower urinary tract symptoms: Secondary | ICD-10-CM

## 2023-02-20 DIAGNOSIS — F339 Major depressive disorder, recurrent, unspecified: Secondary | ICD-10-CM

## 2023-02-20 DIAGNOSIS — E876 Hypokalemia: Secondary | ICD-10-CM | POA: Insufficient documentation

## 2023-02-20 LAB — CBC WITH DIFFERENTIAL/PLATELET
Basophils Absolute: 0 10*3/uL (ref 0.0–0.1)
Basophils Relative: 0.5 % (ref 0.0–3.0)
Eosinophils Absolute: 0.3 10*3/uL (ref 0.0–0.7)
Eosinophils Relative: 3.2 % (ref 0.0–5.0)
HCT: 44.3 % (ref 39.0–52.0)
Hemoglobin: 15.6 g/dL (ref 13.0–17.0)
Lymphocytes Relative: 20.7 % (ref 12.0–46.0)
Lymphs Abs: 1.8 10*3/uL (ref 0.7–4.0)
MCHC: 35.2 g/dL (ref 30.0–36.0)
MCV: 87.4 fL (ref 78.0–100.0)
Monocytes Absolute: 0.7 10*3/uL (ref 0.1–1.0)
Monocytes Relative: 7.6 % (ref 3.0–12.0)
Neutro Abs: 5.8 10*3/uL (ref 1.4–7.7)
Neutrophils Relative %: 68 % (ref 43.0–77.0)
Platelets: 230 10*3/uL (ref 150.0–400.0)
RBC: 5.07 Mil/uL (ref 4.22–5.81)
RDW: 13.2 % (ref 11.5–15.5)
WBC: 8.6 10*3/uL (ref 4.0–10.5)

## 2023-02-20 LAB — LIPID PANEL
Cholesterol: 148 mg/dL (ref 0–200)
HDL: 41.1 mg/dL (ref >39.00)
LDL Cholesterol: 78 mg/dL (ref 0–99)
NonHDL: 107.29
Total CHOL/HDL Ratio: 4
Triglycerides: 144 mg/dL (ref 0.0–149.0)
VLDL: 28.8 mg/dL (ref 0.0–40.0)

## 2023-02-20 LAB — COMPREHENSIVE METABOLIC PANEL
ALT: 21 U/L (ref 0–53)
AST: 17 U/L (ref 0–37)
Albumin: 4.4 g/dL (ref 3.5–5.2)
Alkaline Phosphatase: 69 U/L (ref 39–117)
BUN: 20 mg/dL (ref 6–23)
CO2: 28 meq/L (ref 19–32)
Calcium: 9.5 mg/dL (ref 8.4–10.5)
Chloride: 105 meq/L (ref 96–112)
Creatinine, Ser: 1.12 mg/dL (ref 0.40–1.50)
GFR: 63.82 mL/min (ref >60.00)
Glucose, Bld: 100 mg/dL — ABNORMAL HIGH (ref 70–99)
Potassium: 3.3 meq/L — ABNORMAL LOW (ref 3.5–5.1)
Sodium: 143 meq/L (ref 135–145)
Total Bilirubin: 1.2 mg/dL (ref 0.2–1.2)
Total Protein: 6.5 g/dL (ref 6.0–8.3)

## 2023-02-20 LAB — VITAMIN B12: Vitamin B-12: 517 pg/mL (ref 211–911)

## 2023-02-20 LAB — PSA: PSA: 4.21 ng/mL — ABNORMAL HIGH (ref 0.10–4.00)

## 2023-02-20 LAB — VITAMIN D 25 HYDROXY (VIT D DEFICIENCY, FRACTURES): VITD: 76.8 ng/mL (ref 30.00–100.00)

## 2023-02-20 LAB — TSH: TSH: 1.88 u[IU]/mL (ref 0.35–5.50)

## 2023-02-20 NOTE — Progress Notes (Signed)
Established Patient Office Visit     CC/Reason for Visit: Annual preventive exam and subsequent Medicare wellness visit  HPI: Richard Sparks is a 77 y.o. male who is coming in today for the above mentioned reasons. Past Medical History is significant for: Hypertension, hyperlipidemia, insomnia, depression.  Feeling well without major concerns or complaints.  Has routine eye and dental care.  Colon cancer screening is up-to-date.  Is due for COVID and RSV vaccines.   Past Medical/Surgical History: Past Medical History:  Diagnosis Date   Allergy    BENIGN PROSTATIC HYPERTROPHY 08/01/2006   DEPRESSION 08/01/2006   HYPERLIPIDEMIA 08/01/2006   HYPERTENSION 08/01/2006    Past Surgical History:  Procedure Laterality Date   COLONOSCOPY  last 12/31/2013   NO PAST SURGERIES      Social History:  reports that he has never smoked. He has never used smokeless tobacco. He reports that he does not drink alcohol and does not use drugs.  Allergies: Allergies  Allergen Reactions   Amoxicillin Rash    Family History:  Family History  Problem Relation Age of Onset   Colon cancer Neg Hx    Esophageal cancer Neg Hx    Rectal cancer Neg Hx    Stomach cancer Neg Hx    Colon polyps Neg Hx      Current Outpatient Medications:    amLODipine (NORVASC) 10 MG tablet, TAKE 1 TABLET BY MOUTH DAILY, Disp: 100 tablet, Rfl: 2   atorvastatin (LIPITOR) 40 MG tablet, TAKE 1 TABLET BY MOUTH DAILY, Disp: 100 tablet, Rfl: 2   b complex vitamins tablet, Take 1 tablet by mouth daily., Disp: , Rfl:    buPROPion (WELLBUTRIN XL) 150 MG 24 hr tablet, TAKE 1 TABLET BY MOUTH DAILY, Disp: 100 tablet, Rfl: 0   Cholecalciferol (VITAMIN D) 1000 UNITS capsule, Take 1,000 Units by mouth daily., Disp: , Rfl:    fluticasone (FLONASE) 50 MCG/ACT nasal spray, Place 1 spray into both nostrils daily. (Patient taking differently: Place 1 spray into both nostrils daily as needed.), Disp: 16 g, Rfl: 5   LINZESS 145 MCG CAPS  capsule, TAKE 1 CAPSULE (145 MCG TOTAL) BY MOUTH DAILY BEFORE BREAKFAST. TAKE 30 MINUTES BEFORE BREAKFAST, Disp: 30 capsule, Rfl: 3   loratadine (CLARITIN) 10 MG tablet, Take 10 mg by mouth daily as needed., Disp: , Rfl:    Omega-3 Fatty Acids (FISH OIL) 1000 MG CAPS, Take 4 capsules by mouth daily., Disp: , Rfl:    oxymetazoline (AFRIN) 0.05 % nasal spray, Place 1 spray into both nostrils 2 (two) times daily as needed for congestion., Disp: , Rfl:    Probiotic Product (PROBIO DEFENSE PO), Take by mouth., Disp: , Rfl:    Simethicone 180 MG CAPS, Take by mouth., Disp: , Rfl:    temazepam (RESTORIL) 15 MG capsule, TAKE 1 CAPSULE BY MOUTH AT BEDTIME AS NEEDED FOR SLEEP, Disp: 90 capsule, Rfl: 0   triamterene-hydrochlorothiazide (MAXZIDE-25) 37.5-25 MG tablet, TAKE 1 TABLET BY MOUTH IN THE  MORNING, Disp: 100 tablet, Rfl: 2   Turmeric 500 MG TABS, Take by mouth., Disp: , Rfl:   Review of Systems:  Negative unless indicated in HPI.   Physical Exam: Vitals:   02/20/23 0812  BP: 130/80  Pulse: 72  Temp: 97.7 F (36.5 C)  TempSrc: Oral  SpO2: 99%  Weight: 185 lb 8 oz (84.1 kg)  Height: 5' 10.5" (1.791 m)    Body mass index is 26.24 kg/m.   Physical Exam  Vitals reviewed.  Constitutional:      General: He is not in acute distress.    Appearance: Normal appearance. He is not ill-appearing, toxic-appearing or diaphoretic.  HENT:     Head: Normocephalic.     Right Ear: Tympanic membrane, ear canal and external ear normal. There is no impacted cerumen.     Left Ear: Tympanic membrane, ear canal and external ear normal. There is no impacted cerumen.     Nose: Nose normal.     Mouth/Throat:     Mouth: Mucous membranes are moist.     Pharynx: Oropharynx is clear. No oropharyngeal exudate or posterior oropharyngeal erythema.  Eyes:     General: No scleral icterus.       Right eye: No discharge.        Left eye: No discharge.     Conjunctiva/sclera: Conjunctivae normal.     Pupils:  Pupils are equal, round, and reactive to light.  Neck:     Vascular: No carotid bruit.  Cardiovascular:     Rate and Rhythm: Normal rate and regular rhythm.     Pulses: Normal pulses.     Heart sounds: Normal heart sounds.  Pulmonary:     Effort: Pulmonary effort is normal. No respiratory distress.     Breath sounds: Normal breath sounds.  Abdominal:     General: Abdomen is flat. Bowel sounds are normal.     Palpations: Abdomen is soft.  Musculoskeletal:        General: Normal range of motion.     Cervical back: Normal range of motion.  Skin:    General: Skin is warm and dry.  Neurological:     General: No focal deficit present.     Mental Status: He is alert and oriented to person, place, and time. Mental status is at baseline.  Psychiatric:        Mood and Affect: Mood normal.        Behavior: Behavior normal.        Thought Content: Thought content normal.        Judgment: Judgment normal.    Subsequent Medicare wellness visit   1. Risk factors, based on past  M,S,F - Cardiac Risk Factors include: advanced age (>80men, >84 women);hypertension   2.  Physical activities: Dietary issues and exercise activities discussed:      3.  Depression/mood:  Flowsheet Row Office Visit from 02/20/2023 in [facility] Total Score 5        4.  ADL's:    02/20/2023    8:05 AM  In your present state of health, do you have any difficulty performing the following activities:  Hearing? 0  Vision? 0  Difficulty concentrating or making decisions? 0  Walking or climbing stairs? 0  Dressing or bathing? 0  Doing errands, shopping? 0  Preparing Food and eating ? N  Using the Toilet? N  In the past six months, have you accidently leaked urine? N  Do you have problems with loss of bowel control? N  Managing your Medications? N  Managing your Finances? N  Housekeeping or managing your Housekeeping? N     5.  Fall risk:     02/16/2021    8:01  AM 02/16/2021    8:02 AM 02/16/2022    7:04 AM 12/28/2022    3:54 PM 02/20/2023    8:04 AM  Fall Risk  Falls in the past year? 0 0 0 0 0  Was there an injury with Fall? 0 0 0 0 0  Fall Risk Category Calculator 0 0 0 0 0  Fall Risk Category (Retired) Low Low Low    (RETIRED) Patient Fall Risk Level   Low fall risk    Patient at Risk for Falls Due to   No Fall Risks    Fall risk Follow up   Falls evaluation completed Falls evaluation completed Falls evaluation completed     6.  Home safety: No problems identified   7.  Height weight, and visual acuity: height and weight as above, vision/hearing: Vision Screening   Right eye Left eye Both eyes  Without correction 20/40 20/40 20/40   With correction        8.  Counseling: Counseling given: Not Answered    9. Lab orders based on risk factors: Laboratory update will be reviewed   10. Cognitive assessment:    05/29/2020    8:57 AM  MMSE - Mini Mental State Exam  Orientation to time 5  Orientation to Place 5  Registration 3  Attention/ Calculation 5  Recall 3  Language- name 2 objects 2  Language- repeat 1  Language- follow 3 step command 3  Language- read & follow direction 1  Write a sentence 1  Copy design 1  Total score 30        02/20/2023    8:08 AM  6CIT Screen  What Year? 0 points  What month? 0 points  What time? 0 points  Count back from 20 0 points  Months in reverse 0 points  Repeat phrase 0 points  Total Score 0 points     11. Screening: Patient provided with a written and personalized 5-10 year screening schedule in the AVS. Health Maintenance  Topic Date Due   COVID-19 Vaccine (2 - 2024-25 season) 11/06/2022   Medicare Annual Wellness Visit  02/20/2024   Colon Cancer Screening  02/22/2024   DTaP/Tdap/Td vaccine (3 - Td or Tdap) 12/06/2030   Pneumonia Vaccine  Completed   Flu Shot  Completed   Hepatitis C Screening  Completed   Zoster (Shingles) Vaccine  Completed   HPV Vaccine  Aged Out     12. Provider List Update: Patient Care Team    Relationship Specialty Notifications Start End  Philip Aspen, Limmie Patricia, MD PCP - General Internal Medicine  07/20/18   Sherrill Raring, [facility]  Pharmacist  08/15/22      13. Advance Directives: Does Patient Have a Medical Advance Directive?: No Would patient like information on creating a medical advance directive?: No - Patient declined  14. Opioids: Patient is not on any opioid prescriptions and has no risk factors for a substance use disorder.   15.   Goals      travel         I have personally reviewed and noted the following in the patient's chart:   Medical and social history Use of alcohol, tobacco or illicit drugs  Current medications and supplements Functional ability and status Nutritional status Physical activity Advanced directives List of other physicians Hospitalizations, surgeries, and ER visits in previous 12 months Vitals Screenings to include cognitive, depression, and falls Referrals and appointments  In addition, I have reviewed and discussed with patient certain preventive protocols, quality metrics, and best practice recommendations. A written personalized care plan for preventive services as well as general preventive health recommendations were provided to patient.   Impression and Plan:  Medicare annual wellness visit, subsequent  Dyslipidemia -     Lipid panel; Future  Benign prostatic hyperplasia without lower urinary tract symptoms -     PSA; Future  Essential hypertension -     CBC with Differential/Platelet; Future -     Comprehensive metabolic panel; Future  Depression, recurrent (HCC) -     TSH; Future -     Vitamin B12; Future -     VITAMIN D 25 Hydroxy (Vit-D Deficiency, Fractures); Future   -Recommend routine eye and dental care. -Healthy lifestyle discussed in detail. -Labs to be updated today. -Prostate cancer screening: PSA today Health Maintenance  Topic Date  Due   COVID-19 Vaccine (2 - 2024-25 season) 11/06/2022   Medicare Annual Wellness Visit  02/20/2024   Colon Cancer Screening  02/22/2024   DTaP/Tdap/Td vaccine (3 - Td or Tdap) 12/06/2030   Pneumonia Vaccine  Completed   Flu Shot  Completed   Hepatitis C Screening  Completed   Zoster (Shingles) Vaccine  Completed   HPV Vaccine  Aged Out     -Advised to update COVID and RSV vaccines at pharmacy.     Chaya Jan, MD [facility]

## 2023-02-21 ENCOUNTER — Other Ambulatory Visit: Payer: Self-pay | Admitting: Internal Medicine

## 2023-02-21 ENCOUNTER — Encounter: Payer: Self-pay | Admitting: Internal Medicine

## 2023-02-21 DIAGNOSIS — I10 Essential (primary) hypertension: Secondary | ICD-10-CM

## 2023-02-21 DIAGNOSIS — R972 Elevated prostate specific antigen [PSA]: Secondary | ICD-10-CM

## 2023-02-21 DIAGNOSIS — E876 Hypokalemia: Secondary | ICD-10-CM

## 2023-02-21 MED ORDER — POTASSIUM CHLORIDE CRYS ER 20 MEQ PO TBCR: 20.0000 meq | EXTENDED_RELEASE_TABLET | Freq: Two times a day (BID) | ORAL | 0 refills | Status: DC

## 2023-03-20 ENCOUNTER — Other Ambulatory Visit: Payer: Medicare Other

## 2023-03-20 NOTE — Telephone Encounter (Signed)
 Elspeth, pls see message below, I do not have a form regarding Linzess  auth. Pls obtain what formed that is needed and I will sign it. Pls let patient know that I have not received that form and that we are working on getting it. I will fax once received.  Thanks.

## 2023-03-20 NOTE — Telephone Encounter (Signed)
**Note De-identified  [place] Obfuscation** Please advise 

## 2023-03-21 ENCOUNTER — Other Ambulatory Visit (INDEPENDENT_AMBULATORY_CARE_PROVIDER_SITE_OTHER): Payer: Medicare Other

## 2023-03-21 DIAGNOSIS — E876 Hypokalemia: Secondary | ICD-10-CM

## 2023-03-21 LAB — BASIC METABOLIC PANEL
BUN: 19 mg/dL (ref 6–23)
CO2: 31 meq/L (ref 19–32)
Calcium: 9.8 mg/dL (ref 8.4–10.5)
Chloride: 103 meq/L (ref 96–112)
Creatinine, Ser: 1.33 mg/dL (ref 0.40–1.50)
GFR: 51.9 mL/min — ABNORMAL LOW (ref >60.00)
Glucose, Bld: 108 mg/dL — ABNORMAL HIGH (ref 70–99)
Potassium: 3.6 meq/L (ref 3.5–5.1)
Sodium: 141 meq/L (ref 135–145)

## 2023-03-22 ENCOUNTER — Telehealth: Payer: Self-pay | Admitting: Nurse Practitioner

## 2023-03-22 NOTE — Telephone Encounter (Signed)
 Patient is returning your call.

## 2023-03-22 NOTE — Telephone Encounter (Signed)
 Pt was notified that we have not received a fax. Fax Number was provided for the pt. [phone]. Pt stated that he was going to get the document resent for [facility] to review.  Pt verbalized understanding with all questions answered.

## 2023-03-22 NOTE — Telephone Encounter (Signed)
 Spoke to pt. Documented in my chart message.

## 2023-03-23 NOTE — Telephone Encounter (Signed)
[name], DD gave me the form at this time and she will take care of his Linzess assistance and RX.

## 2023-03-28 NOTE — Telephone Encounter (Signed)
Assistance form was faxed a few days ago. He is welcomed to stop by and get samples.

## 2023-04-03 ENCOUNTER — Telehealth: Payer: Self-pay | Admitting: Pharmacy Technician

## 2023-04-03 NOTE — Telephone Encounter (Signed)
Received fax/notification from [employer] for [facility] patient assistance. Patient's application has been APPROVED.  Effective Dates: 1.21.25 to 12.31.25.   Phone: [phone]

## 2023-04-04 NOTE — Telephone Encounter (Signed)
Pt made aware of the approval.

## 2023-04-04 NOTE — Telephone Encounter (Signed)
Left message for pt to call back

## 2023-04-10 ENCOUNTER — Encounter: Payer: Self-pay | Admitting: Internal Medicine

## 2023-05-01 DIAGNOSIS — R350 Frequency of micturition: Secondary | ICD-10-CM | POA: Diagnosis not present

## 2023-05-15 ENCOUNTER — Other Ambulatory Visit: Payer: Self-pay | Admitting: Internal Medicine

## 2023-06-01 IMAGING — DX DG ABDOMEN 1V
2 series · 2 of 2 positions shown · non-contrast
Comparison: None.

CLINICAL DATA: rule out constipation

EXAM:
ABDOMEN - 1 VIEW

[abdomen kub (1 of 2)]
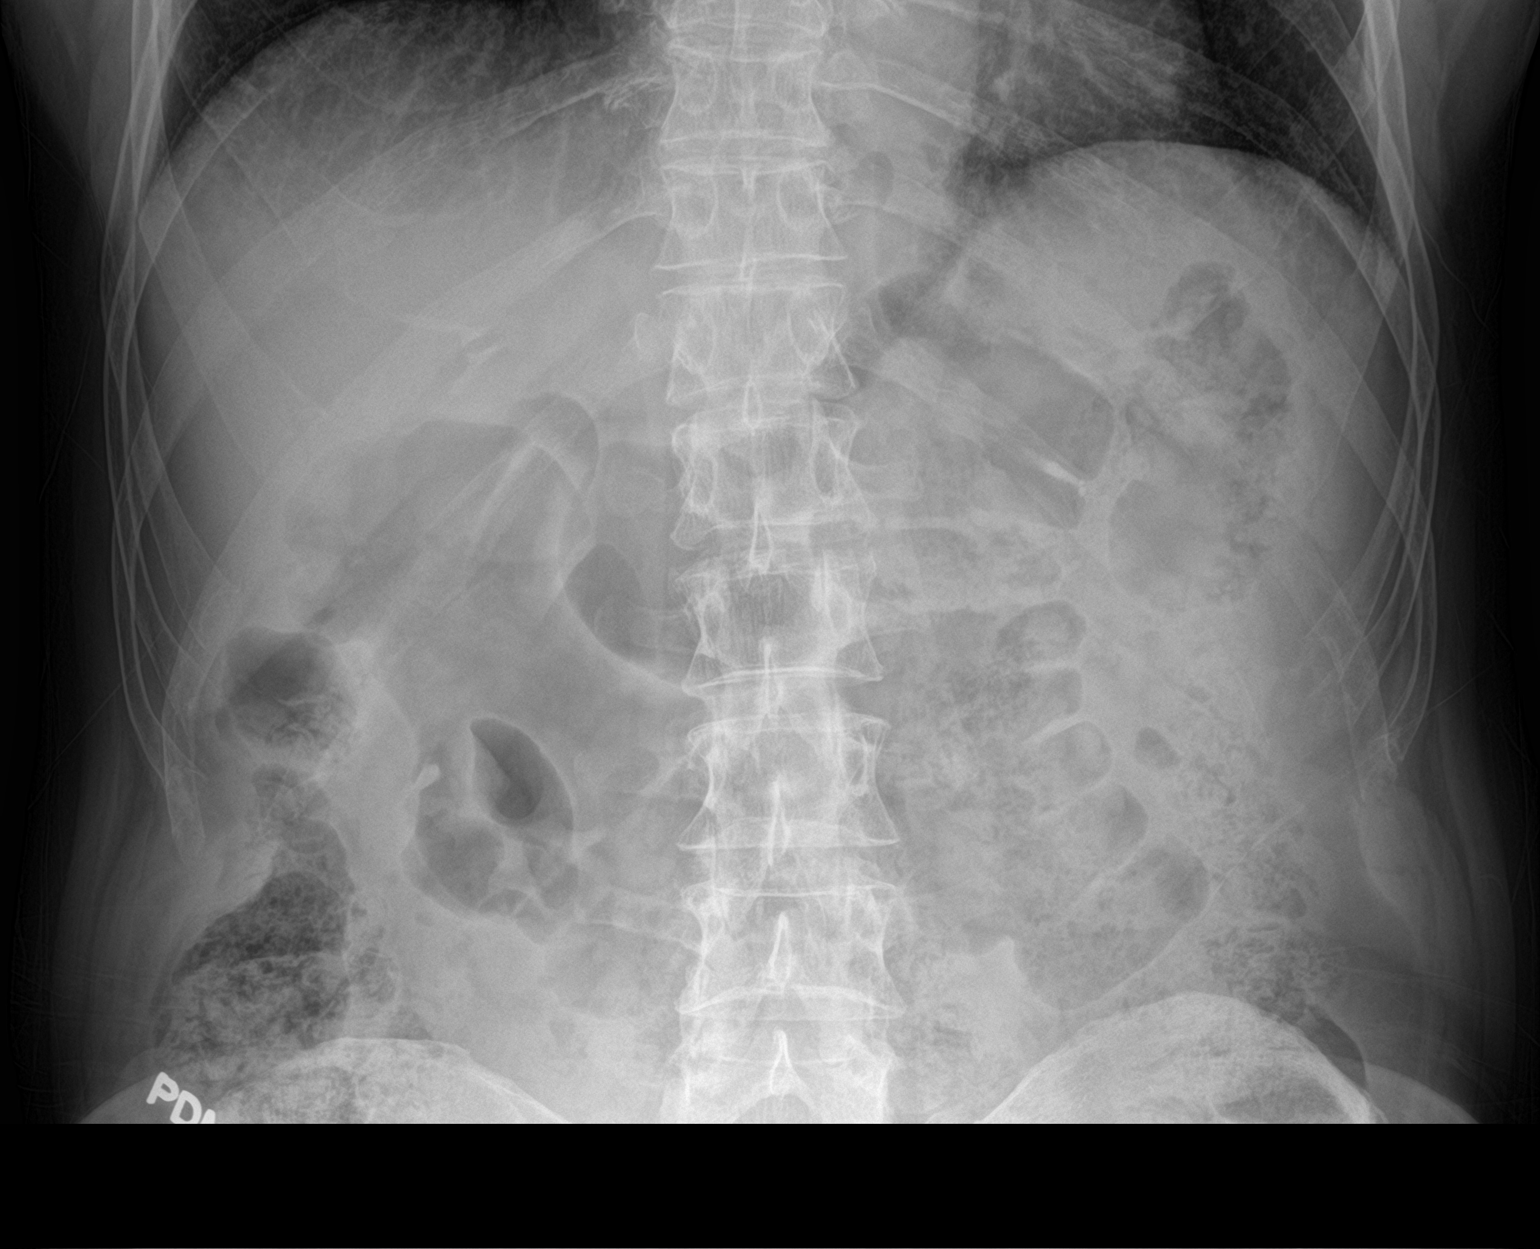

[abdomen kub (2 of 2)]
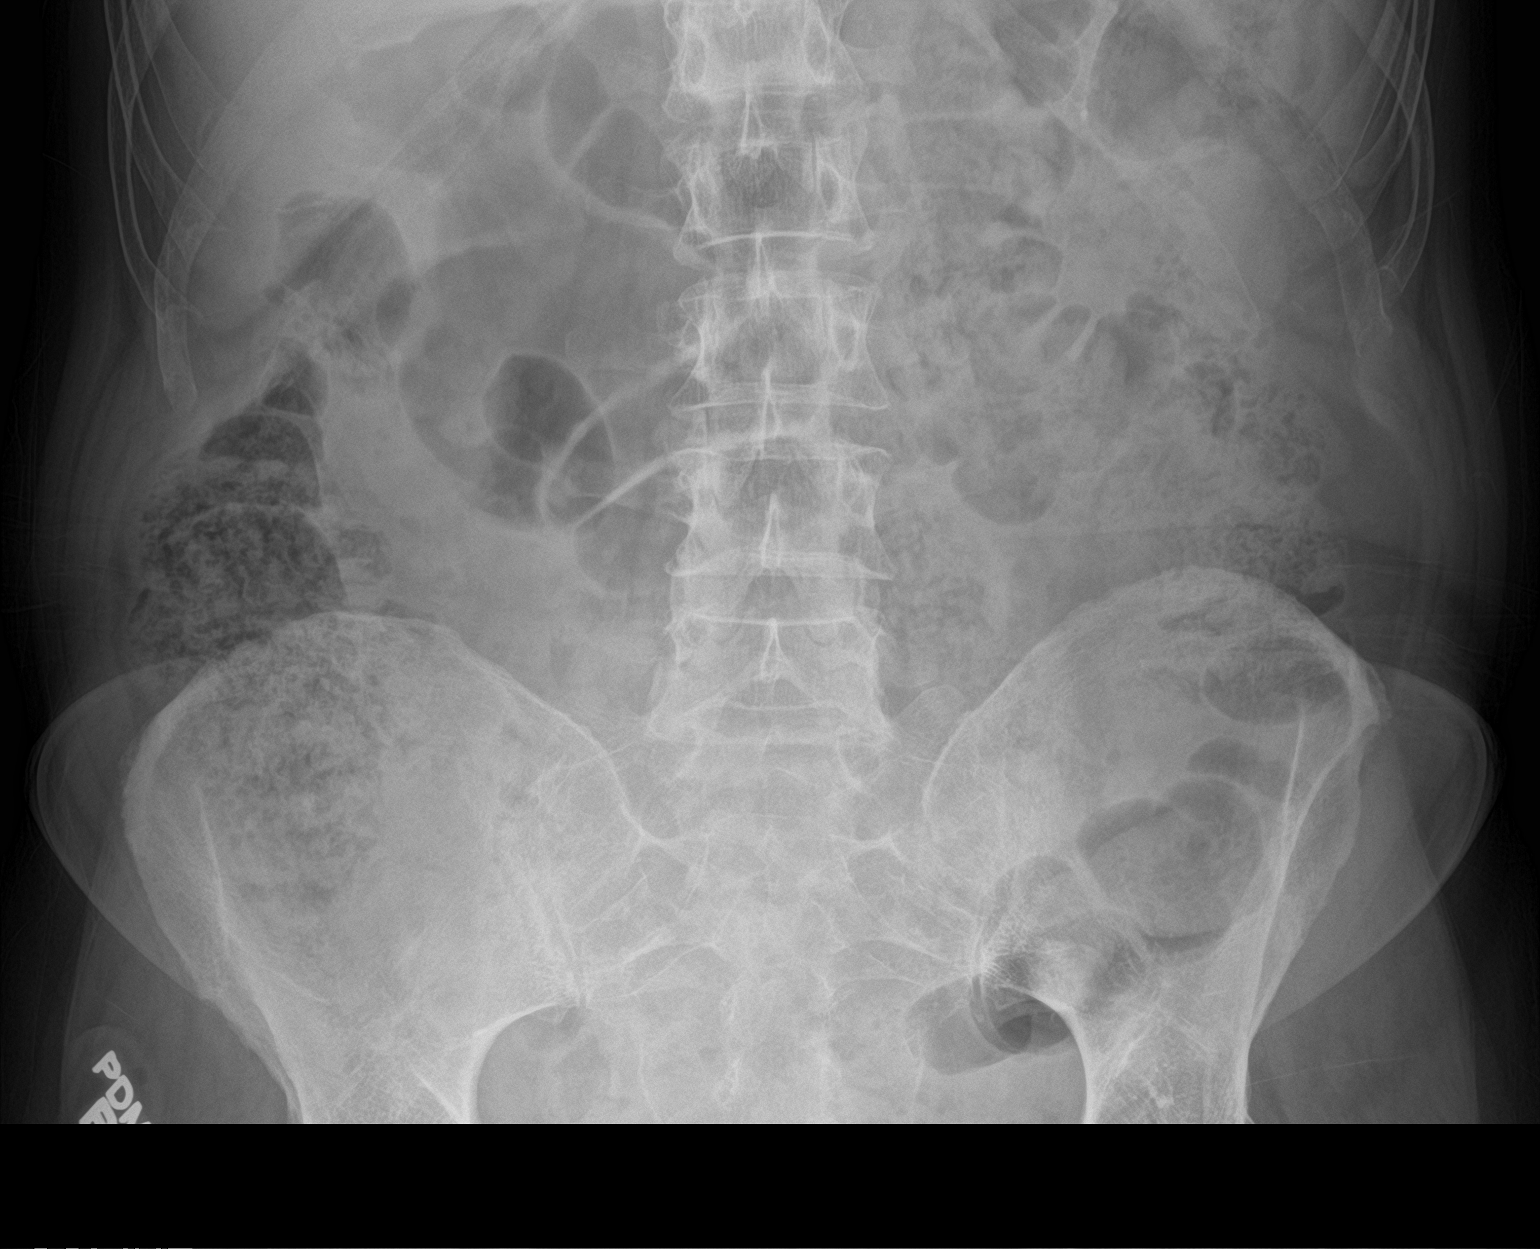

[2 of 2 positions shown; findings below may reference images not displayed]

FINDINGS: Air and stool-filled nondilated loops of bowel. Moderate colonic
stool burden diffusely throughout the colon. Incomplete assessment
of the pelvis. Visualized lung bases are unremarkable. Mild
degenerative changes of the lumbar spine. Pelvic phleboliths.
IMPRESSION: Nonobstructive bowel gas pattern with moderate colonic stool burden.

## 2023-06-26 ENCOUNTER — Other Ambulatory Visit: Payer: Self-pay | Admitting: Internal Medicine

## 2023-06-26 DIAGNOSIS — G4700 Insomnia, unspecified: Secondary | ICD-10-CM

## 2023-08-22 ENCOUNTER — Other Ambulatory Visit: Payer: Self-pay | Admitting: Internal Medicine

## 2023-08-22 DIAGNOSIS — I10 Essential (primary) hypertension: Secondary | ICD-10-CM

## 2023-08-22 LAB — PSA: PSA: 3.43

## 2023-08-28 DIAGNOSIS — R350 Frequency of micturition: Secondary | ICD-10-CM | POA: Diagnosis not present

## 2023-08-29 ENCOUNTER — Encounter: Payer: Self-pay | Admitting: Internal Medicine

## 2023-09-13 ENCOUNTER — Encounter: Payer: Self-pay | Admitting: Internal Medicine

## 2023-10-16 ENCOUNTER — Ambulatory Visit: Payer: Self-pay

## 2023-10-16 NOTE — Telephone Encounter (Signed)
 FYI Only or Action Required?: FYI only for provider.  Patient was last seen in primary care on 02/20/2023 by Theophilus Andrews, Tully GRADE, MD.  Called Nurse Triage reporting Numbness.  Symptoms began several weeks ago.  Interventions attempted: Nothing.  Symptoms are: unchanged.  Triage Disposition: See PCP When Office is Open (Within 3 Days)  Patient/caregiver understands and will follow disposition?: Yes     Copied from CRM #[id number]. Topic: Clinical - Red Word Triage >> Oct 16, 2023  1:39 PM Harlene ORN wrote: Red Word that prompted transfer to Nurse Triage: numbness in index finger in right hand started two weeks ago Reason for Disposition  [1] Numbness or tingling in one or both hands AND [2] is a chronic symptom (recurrent or ongoing AND present > 4 weeks)  Answer Assessment - Initial Assessment Questions 1. SYMPTOM: What is the main symptom you are concerned about? (e.g., weakness, numbness)     Right hand in index finger 2. ONSET: When did this start? (e.g., minutes, hours, days; while sleeping)     2-3 weeks ago 3. LAST NORMAL: When was the last time you (the patient) were normal (no symptoms)?     2-3 weeks ago 4. PATTERN Does this come and go, or has it been constant since it started?  Is it present now?     constant 5. CARDIAC SYMPTOMS: Have you had any of the following symptoms: chest pain, difficulty breathing, palpitations?     no 6. NEUROLOGIC SYMPTOMS: Have you had any of the following symptoms: headache, dizziness, vision loss, double vision, changes in speech, unsteady on your feet?     no 7. OTHER SYMPTOMS: Do you have any other symptoms?     no 8. PREGNANCY: Is there any chance you are pregnant? When was your last menstrual period?     na  Protocols used: Neurologic Deficit-A-AH

## 2023-10-17 NOTE — Telephone Encounter (Signed)
 Noted

## 2023-10-17 NOTE — Telephone Encounter (Signed)
 Yes ok to wait since it is in both hands and has been going on for several weeks

## 2023-10-17 NOTE — Telephone Encounter (Signed)
 Message sent to Dr Ozell for review as PCP is out of the office.

## 2023-10-19 ENCOUNTER — Encounter: Payer: Self-pay | Admitting: Internal Medicine

## 2023-10-19 ENCOUNTER — Ambulatory Visit (INDEPENDENT_AMBULATORY_CARE_PROVIDER_SITE_OTHER): Admitting: Internal Medicine

## 2023-10-19 VITALS — BP 122/60 | HR 61 | Wt 180.6 lb

## 2023-10-19 DIAGNOSIS — R200 Anesthesia of skin: Secondary | ICD-10-CM

## 2023-10-19 LAB — VITAMIN B12: Vitamin B-12: 470 pg/mL (ref 211–911)

## 2023-10-19 LAB — TSH: TSH: 1.51 u[IU]/mL (ref 0.35–5.50)

## 2023-10-19 NOTE — Progress Notes (Signed)
 Established Patient Office Visit     CC/Reason for Visit: Right index finger numbness  HPI: Richard Sparks is a 78 y.o. male who is coming in today for the above mentioned reasons.  Has been going on for about 2 to 3 weeks.  No neck elbow or wrist pain, no numbness or tingling of any other fingers.  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  He admits to spending a lot of time on the computer and uses his index finger to click his mouse.   Past Medical/Surgical History: Past Medical History:  Diagnosis Date   Allergy    BENIGN PROSTATIC HYPERTROPHY 08/01/2006   DEPRESSION 08/01/2006   HYPERLIPIDEMIA 08/01/2006   HYPERTENSION 08/01/2006    Past Surgical History:  Procedure Laterality Date   COLONOSCOPY  last 12/31/2013   NO PAST SURGERIES      Social History:  reports that he has never smoked. He has never used smokeless tobacco. He reports that he does not drink alcohol and does not use drugs.  Allergies: Allergies  Allergen Reactions   Amoxicillin Rash    Family History:  Family History  Problem Relation Age of Onset   Colon cancer Neg Hx    Esophageal cancer Neg Hx    Rectal cancer Neg Hx    Stomach cancer Neg Hx    Colon polyps Neg Hx      Current Outpatient Medications:    amLODipine  (NORVASC ) 10 MG tablet, TAKE 1 TABLET BY MOUTH DAILY, Disp: 100 tablet, Rfl: 1   atorvastatin  (LIPITOR) 40 MG tablet, TAKE 1 TABLET BY MOUTH DAILY, Disp: 100 tablet, Rfl: 1   b complex vitamins tablet, Take 1 tablet by mouth daily., Disp: , Rfl:    buPROPion  (WELLBUTRIN  XL) 150 MG 24 hr tablet, TAKE 1 TABLET BY MOUTH DAILY, Disp: 100 tablet, Rfl: 1   Cholecalciferol (VITAMIN D ) 1000 UNITS capsule, Take 1,000 Units by mouth daily., Disp: , Rfl:    fluticasone  (FLONASE ) 50 MCG/ACT nasal spray, Place 1 spray into both nostrils daily. (Patient taking differently: Place 1 spray into both nostrils daily as needed.), Disp: 16 g, Rfl: 5   LINZESS  145 MCG CAPS  capsule, TAKE 1 CAPSULE (145 MCG TOTAL) BY MOUTH DAILY BEFORE BREAKFAST. TAKE 30 MINUTES BEFORE BREAKFAST, Disp: 30 capsule, Rfl: 3   loratadine (CLARITIN) 10 MG tablet, Take 10 mg by mouth daily as needed., Disp: , Rfl:    Omega-3 Fatty Acids (FISH OIL) 1000 MG CAPS, Take 4 capsules by mouth daily., Disp: , Rfl:    oxymetazoline (AFRIN) 0.05 % nasal spray, Place 1 spray into both nostrils 2 (two) times daily as needed for congestion., Disp: , Rfl:    potassium chloride  SA (KLOR-CON  M) 20 MEQ tablet, Take 1 tablet (20 mEq total) by mouth 2 (two) times daily., Disp: 6 tablet, Rfl: 0   Probiotic Product (PROBIO DEFENSE PO), Take by mouth., Disp: , Rfl:    Simethicone 180 MG CAPS, Take by mouth., Disp: , Rfl:    temazepam  (RESTORIL ) 15 MG capsule, TAKE 1 CAPSULE BY MOUTH AT BEDTIME AS NEEDED FOR SLEEP, Disp: 90 capsule, Rfl: 0   triamterene -hydrochlorothiazide (MAXZIDE-25) 37.5-25 MG tablet, TAKE 1 TABLET BY MOUTH IN THE  MORNING, Disp: 100 tablet, Rfl: 1   Turmeric 500 MG TABS, Take by mouth., Disp: , Rfl:   Review of Systems:  Negative unless indicated in HPI.   Physical Exam: Vitals:   10/19/23 1024  BP: 122/60  Pulse: 61  SpO2: 97%  Weight: 180 lb 9.6 oz (81.9 kg)    Body mass index is 25.55 kg/m.     Impression and Plan:  Numbness of finger -     Vitamin B12; Future -     TSH; Future   - Unclear diagnosis but suspect due to significant computer usage.  Unlikely to be carpal tunnel ulnar entrapment or even cervical as it is isolated to 1 fingertip only.  Check vitamin B12 and TSH.   Time spent:22 minutes reviewing chart, interviewing and examining patient and formulating plan of care.     [name], MD [facility]

## 2023-10-24 ENCOUNTER — Ambulatory Visit: Payer: Self-pay | Admitting: Internal Medicine

## 2023-12-04 ENCOUNTER — Other Ambulatory Visit: Payer: Self-pay | Admitting: Internal Medicine

## 2023-12-04 DIAGNOSIS — G4700 Insomnia, unspecified: Secondary | ICD-10-CM

## 2023-12-05 ENCOUNTER — Other Ambulatory Visit: Payer: Self-pay | Admitting: Internal Medicine

## 2024-02-08 ENCOUNTER — Ambulatory Visit: Admitting: Family Medicine

## 2024-02-08 DIAGNOSIS — Z0000 Encounter for general adult medical examination without abnormal findings: Secondary | ICD-10-CM | POA: Diagnosis not present

## 2024-02-08 NOTE — Patient Instructions (Signed)
 I really enjoyed getting to talk with you today! I am available on Tuesdays and Thursdays for virtual visits if you have any questions or concerns, or if I can be of any further assistance.   CHECKLIST FROM ANNUAL WELLNESS VISIT:  -Follow up (please call to schedule if not scheduled after visit):   -yearly for annual wellness visit with primary care office  Here is a list of your preventive care/health maintenance measures and the plan for each if any are due:  PLAN For any measures below that may be due:    1. Please check with your gastroenterologist about the colon cancer screening   2, can get vaccines at the pharmacy. Please let us  know if you do so that we can update your record.   Health Maintenance  Topic Date Due   Influenza Vaccine  10/06/2023   COVID-19 Vaccine (2 - 2025-26 season) 11/06/2023   Colonoscopy  02/22/2024   Medicare Annual Wellness (AWV)  02/07/2025   DTaP/Tdap/Td (3 - Td or Tdap) 12/06/2030   Pneumococcal Vaccine: 50+ Years  Completed   Hepatitis C Screening  Completed   Zoster Vaccines- Shingrix  Completed   Meningococcal B Vaccine  Aged Out    -See a dentist at least yearly  -Get your eyes checked and then per your eye specialist's recommendations  -Other issues addressed today:   -I have included below further information regarding a healthy whole foods based diet, physical activity guidelines for adults, stress management and opportunities for social connections. I hope you find this information useful.   -----------------------------------------------------------------------------------------------------------------------------------------------------------------------------------------------------------------------------------------------------------    NUTRITION: -eat real food: lots of colorful vegetables (half the plate) and fruits -5-7 servings of vegetables and fruits per day (fresh or steamed is best), exp. 2 servings of vegetables with  lunch and dinner and 2 servings of fruit per day. Berries and greens such as kale and collards are great choices.  -consume on a regular basis:  fresh fruits, fresh veggies, fish, nuts, seeds, healthy oils (such as olive oil, avocado oil), whole grains (make sure for bread/pasta/crackers/etc., that the first ingredient on label contains the word whole), legumes. -can eat small amounts of dairy and lean meat (no larger than the palm of your hand), but avoid processed meats such as ham, bacon, lunch meat, etc. -drink water -try to avoid fast food and pre-packaged foods, processed meat, ultra processed foods/beverages (donuts, candy, etc.) -most experts advise limiting sodium to < 2300mg  per day, should limit further is any chronic conditions such as high blood pressure, heart disease, diabetes, etc. The American Heart Association advised that < 1500mg  is is ideal -try to avoid foods/beverages that contain any ingredients with names you do not recognize  -try to avoid foods/beverages  with added sugar or sweeteners/sweets  -try to avoid sweet drinks (including diet drinks): soda, juice, Gatorade, sweet tea, power drinks, diet drinks -try to avoid white rice, white bread, pasta (unless whole grain)  EXERCISE GUIDELINES FOR ADULTS: -if you wish to increase your physical activity, do so gradually and with the approval of your doctor -STOP and seek medical care immediately if you have any chest pain, chest discomfort or trouble breathing when starting or increasing exercise  -move and stretch your body, legs, feet and arms when sitting for long periods -Physical activity guidelines for optimal health in adults: -get at least 150 minutes per week of moderate exercise (can talk, but not sing); this is about 20-30 minutes of sustained activity 5-7 days per week or two  10-15 minute episodes of sustained activity 5-7 days per week -do some muscle building/resistance training/strength training at least 2 days  per week  -balance exercises 3+ days per week:   Stand somewhere where you have something sturdy to hold onto if you lose balance    1) lift up on toes, then back down, start with 5x per day and work up to 20x   2) stand and lift one leg straight out to the side so that foot is a few inches of the floor, start with 5x each side and work up to 20x each side   3) stand on one foot, start with 5 seconds each side and work up to 20 seconds on each side  If you need ideas or help with getting more active:  -Silver sneakers https://tools.silversneakers.com  -Walk with a Doc: Http://www.duncan-williams.com/  -try to include resistance (weight lifting/strength building) and balance exercises twice per week: or the following link for ideas: http://castillo-powell.com/  buyducts.dk  STRESS MANAGEMENT: -can try meditating, or just sitting quietly with deep breathing while intentionally relaxing all parts of your body for 5 minutes daily -if you need further help with stress, anxiety or depression please follow up with your primary doctor or contact the wonderful folks at [facility]: [phone]  SOCIAL CONNECTIONS: -options in [place] if you wish to engage in more social and exercise related activities:  -Silver sneakers https://tools.silversneakers.com  -Walk with a Doc: Http://www.duncan-williams.com/  -Check out the [facility] Active Adults 50+ section on the [place] of [employer] companies (hiking clubs, book clubs, cards and games, chess, exercise classes, aquatic classes and much more) - see the website for details: https://www.[place].gov/departments/parks-recreation/active-adults50  -YouTube has lots of exercise videos for different ages and abilities as well  -[facility] (a variety of indoor and outdoor inperson activities for adults). 727-336-2376. [address]  -Virtual Online Classes (a variety of topics): see seniorplanet.org or call (941) 314-1339  -consider volunteering at a school, hospice center, church, senior center or elsewhere

## 2024-02-08 NOTE — Progress Notes (Signed)
 ----------------------------------------------------------------------------------------------------------------------------------------------------------------------------------------------------------------------  Because this visit was a virtual/telehealth visit, some criteria may be missing or patient reported. Any vitals not documented were not able to be obtained and vitals that have been documented are patient reported.    MEDICARE ANNUAL PREVENTIVE CARE VISIT WITH PROVIDER (Welcome to Medicare, initial annual wellness or annual wellness exam)  Virtual Visit via Video Note  I connected with Richard Sparks on 02/08/24  by  a video enabled telemedicine application and verified that I am speaking with the correct person using two identifiers.  Location patient: home Location provider:work or home office Persons participating in the virtual visit: patient, provider  Concerns and/or follow up today: detailed intake and health/risks assessment completed on flow sheets and below- please see for details.   How often do you have a drink containing alcohol?n How many drinks containing alcohol do you have on a typical day when you are drinking?na How often do you have six or more drinks on one occasion?na Have you ever smoked?n Quit date if applicable? n  How many packs a day do/did you smoke? na Do you use smokeless tobacco?na Do you use an illicit drugs?na Last dentist visit?see dentist on a regular basis Last eye Exam and location? Goes on a regular basic   See HM section in Epic for other details of completed HM.    ROS: negative for report of fevers, unintentional weight loss, vision changes, vision loss, hearing loss or change, chest pain, sob, hemoptysis, melena, hematochezia, hematuria,bleeding or bruising  Patient-completed extensive health risk assessment - reviewed and discussed with the patient: See Health Risk Assessment completed with patient prior to the visit either  above or in recent phone note. This was reviewed in detailed with the patient today and appropriate recommendations, orders and referrals were placed as needed per Summary below and patient instructions.   Review of Medical History: -PMH, PSH, Family History and current specialty and care providers reviewed and updated and listed below   Patient Care Team: Theophilus Andrews, Tully GRADE, MD as PCP - General (Internal Medicine) Lionell Jon DEL, [facility] (Pharmacist)   Past Medical History:  Diagnosis Date   Allergy    BENIGN PROSTATIC HYPERTROPHY 08/01/2006   DEPRESSION 08/01/2006   HYPERLIPIDEMIA 08/01/2006   HYPERTENSION 08/01/2006    Past Surgical History:  Procedure Laterality Date   COLONOSCOPY  last 12/31/2013   NO PAST SURGERIES      Social History   Socioeconomic History   Marital status: Divorced    Spouse name: Not on file   Number of children: Not on file   Years of education: Not on file   Highest education level: Doctorate  Occupational History   Not on file  Tobacco Use   Smoking status: Never   Smokeless tobacco: Never  Vaping Use   Vaping status: Never Used  Substance and Sexual Activity   Alcohol use: No    Alcohol/week: 0.0 standard drinks of alcohol   Drug use: No   Sexual activity: Not on file  Other Topics Concern   Not on file  Social History Narrative   Not on file   Social Drivers of Health   Financial Resource Strain: Low Risk  (02/08/2024)   Overall Financial Resource Strain (CARDIA)    Difficulty of Paying Living Expenses: Not hard at all  Food Insecurity: No Food Insecurity (02/08/2024)   Hunger Vital Sign    Worried About Running Out of Food in the Last Year: Never true  Ran Out of Food in the Last Year: Never true  Transportation Needs: No Transportation Needs (02/08/2024)   PRAPARE - Administrator, Civil Service (Medical): No    Lack of Transportation (Non-Medical): No  Physical Activity: Unknown (02/08/2024)   Exercise  Vital Sign    Days of Exercise per Week: 3 days    Minutes of Exercise per Session: Not on file  Stress: No Stress Concern Present (02/08/2024)   [employer] of Occupational Health - Occupational Stress Questionnaire    Feeling of Stress: Only a little  Social Connections: Moderately Integrated (02/08/2024)   Social Connection and Isolation Panel    Frequency of Communication with Friends and Family: Never    Frequency of Social Gatherings with Friends and Family: More than three times a week    Attends Religious Services: More than 4 times per year    Active Member of [employer] or Organizations: Yes    Attends Engineer, Structural: More than 4 times per year    Marital Status: Divorced  Intimate Partner Violence: Not At Risk (02/20/2023)   Humiliation, Afraid, Rape, and Kick questionnaire    Fear of Current or Ex-Partner: No    Emotionally Abused: No    Physically Abused: No    Sexually Abused: No    Family History  Problem Relation Age of Onset   Colon cancer Neg Hx    Esophageal cancer Neg Hx    Rectal cancer Neg Hx    Stomach cancer Neg Hx    Colon polyps Neg Hx     Current Outpatient Medications on File Prior to Visit  Medication Sig Dispense Refill   amLODipine  (NORVASC ) 10 MG tablet TAKE 1 TABLET BY MOUTH DAILY 100 tablet 1   atorvastatin  (LIPITOR) 40 MG tablet TAKE 1 TABLET BY MOUTH DAILY 100 tablet 1   b complex vitamins tablet Take 1 tablet by mouth daily.     buPROPion  (WELLBUTRIN  XL) 150 MG 24 hr tablet TAKE 1 TABLET BY MOUTH DAILY 100 tablet 0   Cholecalciferol (VITAMIN D ) 1000 UNITS capsule Take 1,000 Units by mouth daily.     fluticasone  (FLONASE ) 50 MCG/ACT nasal spray Place 1 spray into both nostrils daily. (Patient taking differently: Place 1 spray into both nostrils daily as needed.) 16 g 5   LINZESS  145 MCG CAPS capsule TAKE 1 CAPSULE (145 MCG TOTAL) BY MOUTH DAILY BEFORE BREAKFAST. TAKE 30 MINUTES BEFORE BREAKFAST 30 capsule 3   loratadine  (CLARITIN) 10 MG tablet Take 10 mg by mouth daily as needed.     Omega-3 Fatty Acids (FISH OIL) 1000 MG CAPS Take 4 capsules by mouth daily.     oxymetazoline (AFRIN) 0.05 % nasal spray Place 1 spray into both nostrils 2 (two) times daily as needed for congestion.     Simethicone 180 MG CAPS Take by mouth.     temazepam  (RESTORIL ) 15 MG capsule TAKE 1 CAPSULE BY MOUTH AT BEDTIME AS NEEDED FOR SLEEP 90 capsule 0   triamterene -hydrochlorothiazide (MAXZIDE-25) 37.5-25 MG tablet TAKE 1 TABLET BY MOUTH IN THE  MORNING 100 tablet 1   Turmeric 500 MG TABS Take by mouth.     No current facility-administered medications on file prior to visit.    Allergies  Allergen Reactions   Amoxicillin Rash       Physical Exam Vitals requested from patient and listed below if patient had equipment and was able to obtain at home for this virtual visit: There were no vitals  filed for this visit. Estimated body mass index is 25.55 kg/m as calculated from the following:   Height as of 02/20/23: 5' 10.5 (1.791 m).   Weight as of 10/19/23: 180 lb 9.6 oz (81.9 kg).  EKG (optional): deferred due to virtual visit  GENERAL: alert, oriented, no acute distress detected; full vision exam deferred due to pandemic and/or virtual encounter  HEENT: atraumatic, conjunttiva clear, no obvious abnormalities on inspection of external nose and ears  NECK: normal movements of the head and neck  LUNGS: on inspection no signs of respiratory distress, breathing rate appears normal, no obvious gross SOB, gasping or wheezing  CV: no obvious cyanosis  MS: moves all visible extremities without noticeable abnormality  PSYCH/NEURO: pleasant and cooperative, no obvious depression or anxiety, speech and thought processing grossly intact, Cognitive function grossly intact  Flowsheet Row Office Visit from 10/19/2023 in [facility] [place]  PHQ-9 Total Score 6        02/08/2024    3:30 PM 10/19/2023    10:54 AM 02/20/2023    8:02 AM 12/28/2022    3:54 PM 02/16/2022    7:04 AM  Depression screen PHQ 2/9  Decreased Interest 0 0 0 0 1  Down, Depressed, Hopeless 1 1 2 2  0  PHQ - 2 Score 1 1 2 2 1   Altered sleeping  2 1 2 3   Tired, decreased energy  1 1 2 1   Change in appetite  0 0 0 0  Feeling bad or failure about yourself   1 1 0 1  Trouble concentrating  1 0 0 1  Moving slowly or fidgety/restless  0 0 0 0  Suicidal thoughts  0 0 0 0  PHQ-9 Score  6  5  6  7    Difficult doing work/chores  Somewhat difficult   Not difficult at all     Data saved with a previous flowsheet row definition       02/16/2022    7:04 AM 12/28/2022    3:54 PM 02/20/2023    8:04 AM 02/08/2024   10:34 AM 02/08/2024    3:24 PM  Fall Risk  Falls in the past year? 0 0 0 0   Was there an injury with Fall? 0  0  0   0  Fall Risk Category Calculator 0 0 0    Fall Risk Category (Retired) Low       (RETIRED) Patient Fall Risk Level Low fall risk       Patient at Risk for Falls Due to No Fall Risks    No Fall Risks  Fall risk Follow up Falls evaluation completed  Falls evaluation completed Falls evaluation completed  Falls evaluation completed     Data saved with a previous flowsheet row definition     SUMMARY AND PLAN:  Medicare annual wellness visit, subsequent  Discussed applicable health maintenance/preventive health measures and advised and referred or ordered per patient preferences: -discussed colonoscopy - he reports only benign polyps on his last colonoscopy and he does not think he will get further - advised to discuss further with his gastroenterologist. -discussed vaccines due recs/risks Health Maintenance  Topic Date Due   Influenza Vaccine  10/06/2023   COVID-19 Vaccine (2 - 2025-26 season) 11/06/2023   Colonoscopy  02/22/2024   Medicare Annual Wellness (AWV)  02/07/2025   DTaP/Tdap/Td (3 - Td or Tdap) 12/06/2030   Pneumococcal Vaccine: 50+ Years  Completed   Hepatitis C Screening   Completed  Zoster Vaccines- Shingrix  Completed   Meningococcal B Vaccine  Aged Out     Education and counseling on the following was provided based on the above review of health and a plan/checklist for the patient, along with additional information discussed, was provided for the patient in the patient instructions :   -Advised and counseled on a healthy lifestyle - including the importance of a healthy diet, regular physical activity, social connections and stress management. -Reviewed patient's current diet. Advised and counseled on a whole foods based healthy diet. A summary of a healthy diet was provided in the Patient Instructions.  -reviewed patient's current physical activity level and discussed exercise guidelines for adults. Discussed community resources and ideas for safe exercise at home to assist in meeting exercise guideline recommendations in a safe and healthy way.  -Advise yearly dental visits at minimum and regular eye exams   Follow up: see patient instructions   There are no Patient Instructions on file for this visit.  Chiquita JONELLE Cramp, DO

## 2024-02-21 ENCOUNTER — Encounter: Payer: Self-pay | Admitting: Internal Medicine

## 2024-02-21 ENCOUNTER — Ambulatory Visit: Payer: Medicare Other | Admitting: Internal Medicine

## 2024-02-21 ENCOUNTER — Ambulatory Visit: Payer: Self-pay | Admitting: Internal Medicine

## 2024-02-21 VITALS — BP 130/78 | HR 59 | Temp 97.6°F | Ht 70.5 in | Wt 180.6 lb

## 2024-02-21 DIAGNOSIS — E785 Hyperlipidemia, unspecified: Secondary | ICD-10-CM

## 2024-02-21 DIAGNOSIS — I10 Essential (primary) hypertension: Secondary | ICD-10-CM | POA: Diagnosis not present

## 2024-02-21 DIAGNOSIS — Z0000 Encounter for general adult medical examination without abnormal findings: Secondary | ICD-10-CM

## 2024-02-21 DIAGNOSIS — R972 Elevated prostate specific antigen [PSA]: Secondary | ICD-10-CM

## 2024-02-21 DIAGNOSIS — F339 Major depressive disorder, recurrent, unspecified: Secondary | ICD-10-CM

## 2024-02-21 DIAGNOSIS — E876 Hypokalemia: Secondary | ICD-10-CM

## 2024-02-21 LAB — COMPREHENSIVE METABOLIC PANEL WITH GFR
ALT: 21 U/L (ref 3–53)
AST: 18 U/L (ref 5–37)
Albumin: 4.3 g/dL (ref 3.5–5.2)
Alkaline Phosphatase: 67 U/L (ref 39–117)
BUN: 17 mg/dL (ref 6–23)
CO2: 30 meq/L (ref 19–32)
Calcium: 9.7 mg/dL (ref 8.4–10.5)
Chloride: 104 meq/L (ref 96–112)
Creatinine, Ser: 1.09 mg/dL (ref 0.40–1.50)
GFR: 65.47 mL/min (ref >60.00)
Glucose, Bld: 106 mg/dL — ABNORMAL HIGH (ref 70–99)
Potassium: 3.5 meq/L (ref 3.5–5.1)
Sodium: 141 meq/L (ref 135–145)
Total Bilirubin: 1 mg/dL (ref 0.2–1.2)
Total Protein: 6.7 g/dL (ref 6.0–8.3)

## 2024-02-21 LAB — LIPID PANEL
Cholesterol: 140 mg/dL (ref 28–200)
HDL: 41.4 mg/dL (ref >39.00)
LDL Cholesterol: 66 mg/dL (ref 10–99)
NonHDL: 98.79
Total CHOL/HDL Ratio: 3
Triglycerides: 165 mg/dL — ABNORMAL HIGH (ref 10.0–149.0)
VLDL: 33 mg/dL (ref 0.0–40.0)

## 2024-02-21 LAB — CBC WITH DIFFERENTIAL/PLATELET
Basophils Absolute: 0.1 K/uL (ref 0.0–0.1)
Basophils Relative: 1 % (ref 0.0–3.0)
Eosinophils Absolute: 0.2 K/uL (ref 0.0–0.7)
Eosinophils Relative: 3.7 % (ref 0.0–5.0)
HCT: 44.9 % (ref 39.0–52.0)
Hemoglobin: 15.6 g/dL (ref 13.0–17.0)
Lymphocytes Relative: 28.2 % (ref 12.0–46.0)
Lymphs Abs: 1.9 K/uL (ref 0.7–4.0)
MCHC: 34.8 g/dL (ref 30.0–36.0)
MCV: 86.8 fl (ref 78.0–100.0)
Monocytes Absolute: 0.6 K/uL (ref 0.1–1.0)
Monocytes Relative: 8.4 % (ref 3.0–12.0)
Neutro Abs: 4 K/uL (ref 1.4–7.7)
Neutrophils Relative %: 58.7 % (ref 43.0–77.0)
Platelets: 247 K/uL (ref 150.0–400.0)
RBC: 5.18 Mil/uL (ref 4.22–5.81)
RDW: 13.4 % (ref 11.5–15.5)
WBC: 6.7 K/uL (ref 4.0–10.5)

## 2024-02-21 LAB — TSH: TSH: 2.58 u[IU]/mL (ref 0.35–5.50)

## 2024-02-21 LAB — VITAMIN B12: Vitamin B-12: 539 pg/mL (ref 211–911)

## 2024-02-21 LAB — VITAMIN D 25 HYDROXY (VIT D DEFICIENCY, FRACTURES): VITD: 77.25 ng/mL (ref 30.00–100.00)

## 2024-02-21 MED ORDER — SERTRALINE HCL 25 MG PO TABS: 25.0000 mg | ORAL_TABLET | Freq: Every day | ORAL | 1 refills | Status: DC

## 2024-02-21 NOTE — Progress Notes (Signed)
 Established Patient Office Visit     CC/Reason for Visit: Annual preventive exam, discuss depression  HPI: Richard Sparks is a 78 y.o. male who is coming in today for the above mentioned reasons. Past Medical History is significant for: Hypertension, hyperlipidemia, depression, insomnia.  He has been on bupropion  for years.  He feels his depression has worsened.  He has no energy or drive to do things.  Has routine eye and dental care.  Immunizations are up-to-date.   Past Medical/Surgical History: Past Medical History:  Diagnosis Date   Allergy    BENIGN PROSTATIC HYPERTROPHY 08/01/2006   DEPRESSION 08/01/2006   HYPERLIPIDEMIA 08/01/2006   HYPERTENSION 08/01/2006    Past Surgical History:  Procedure Laterality Date   COLONOSCOPY  last 12/31/2013   NO PAST SURGERIES      Social History:  reports that he has never smoked. He has never used smokeless tobacco. He reports that he does not drink alcohol and does not use drugs.  Allergies: Allergies[1]  Family History:  Family History  Problem Relation Age of Onset   Colon cancer Neg Hx    Esophageal cancer Neg Hx    Rectal cancer Neg Hx    Stomach cancer Neg Hx    Colon polyps Neg Hx     Current Medications[2]  Review of Systems:  Negative unless indicated in HPI.   Physical Exam: Vitals:   02/21/24 0737  BP: 130/78  Pulse: (!) 59  Temp: 97.6 F (36.4 C)  TempSrc: Oral  SpO2: 100%  Weight: 180 lb 9.6 oz (81.9 kg)  Height: 5' 10.5 (1.791 m)    Body mass index is 25.55 kg/m.   Physical Exam Vitals reviewed.  Constitutional:      General: He is not in acute distress.    Appearance: Normal appearance. He is not ill-appearing, toxic-appearing or diaphoretic.  HENT:     Head: Normocephalic.     Right Ear: Tympanic membrane, ear canal and external ear normal. There is no impacted cerumen.     Left Ear: Tympanic membrane, ear canal and external ear normal. There is no impacted cerumen.     Nose: Nose  normal.     Mouth/Throat:     Mouth: Mucous membranes are moist.     Pharynx: Oropharynx is clear. No oropharyngeal exudate or posterior oropharyngeal erythema.  Eyes:     General: No scleral icterus.       Right eye: No discharge.        Left eye: No discharge.     Conjunctiva/sclera: Conjunctivae normal.     Pupils: Pupils are equal, round, and reactive to light.  Neck:     Vascular: No carotid bruit.  Cardiovascular:     Rate and Rhythm: Normal rate and regular rhythm.     Pulses: Normal pulses.     Heart sounds: Normal heart sounds.  Pulmonary:     Effort: Pulmonary effort is normal. No respiratory distress.     Breath sounds: Normal breath sounds.  Abdominal:     General: Abdomen is flat. Bowel sounds are normal.     Palpations: Abdomen is soft.  Musculoskeletal:        General: Normal range of motion.     Cervical back: Normal range of motion.  Skin:    General: Skin is warm and dry.  Neurological:     General: No focal deficit present.     Mental Status: He is alert and oriented to person, place,  and time. Mental status is at baseline.  Psychiatric:        Mood and Affect: Mood normal.        Behavior: Behavior normal.        Thought Content: Thought content normal.        Judgment: Judgment normal.      Impression and Plan:  Encounter for preventive health examination  Essential hypertension -     CBC with Differential/Platelet; Future -     Comprehensive metabolic panel with GFR; Future -     TSH; Future -     Vitamin B12; Future -     VITAMIN D  25 Hydroxy (Vit-D Deficiency, Fractures); Future  Hypokalemia  Dyslipidemia -     Lipid panel; Future  Depression, recurrent (HCC) -     Sertraline  HCl; Take 1 tablet (25 mg total) by mouth daily.  Dispense: 90 tablet; Refill: 1   -Recommend routine eye and dental care. -Healthy lifestyle discussed in detail. -Labs to be updated today. -Prostate cancer screening: Followed by urology with a recent PSA of  3.4 Health Maintenance  Topic Date Due   COVID-19 Vaccine (2 - 2025-26 season) 11/06/2023   Colon Cancer Screening  02/22/2024   Medicare Annual Wellness Visit  02/07/2025   DTaP/Tdap/Td vaccine (3 - Td or Tdap) 12/06/2030   Pneumococcal Vaccine for age over 4  Completed   Flu Shot  Completed   Hepatitis C Screening  Completed   Zoster (Shingles) Vaccine  Completed   Meningitis B Vaccine  Aged Out     -All immunizations are up-to-date. - Has follow-up with GI to discuss necessity of continuing colonoscopies given his age. - Start sertraline  25 mg in addition to his bupropion  for his ongoing depressive symptoms.  Follow-up in 3 months.    [name], MD [facility]     [1]  Allergies Allergen Reactions   Amoxicillin Rash  [2]  Current Outpatient Medications:    amLODipine  (NORVASC ) 10 MG tablet, TAKE 1 TABLET BY MOUTH DAILY, Disp: 100 tablet, Rfl: 1   atorvastatin  (LIPITOR) 40 MG tablet, TAKE 1 TABLET BY MOUTH DAILY, Disp: 100 tablet, Rfl: 1   b complex vitamins tablet, Take 1 tablet by mouth daily., Disp: , Rfl:    buPROPion  (WELLBUTRIN  XL) 150 MG 24 hr tablet, TAKE 1 TABLET BY MOUTH DAILY, Disp: 100 tablet, Rfl: 0   Cholecalciferol (VITAMIN D ) 1000 UNITS capsule, Take 1,000 Units by mouth daily., Disp: , Rfl:    fluticasone  (FLONASE ) 50 MCG/ACT nasal spray, Place 1 spray into both nostrils daily., Disp: 16 g, Rfl: 5   LINZESS  145 MCG CAPS capsule, TAKE 1 CAPSULE (145 MCG TOTAL) BY MOUTH DAILY BEFORE BREAKFAST. TAKE 30 MINUTES BEFORE BREAKFAST, Disp: 30 capsule, Rfl: 3   loratadine (CLARITIN) 10 MG tablet, Take 10 mg by mouth daily as needed., Disp: , Rfl:    Omega-3 Fatty Acids (FISH OIL) 1000 MG CAPS, Take 4 capsules by mouth daily., Disp: , Rfl:    oxymetazoline (AFRIN) 0.05 % nasal spray, Place 1 spray into both nostrils 2 (two) times daily as needed for congestion., Disp: , Rfl:    sertraline  (ZOLOFT ) 25 MG tablet, Take 1 tablet (25  mg total) by mouth daily., Disp: 90 tablet, Rfl: 1   Simethicone 180 MG CAPS, Take by mouth., Disp: , Rfl:    temazepam  (RESTORIL ) 15 MG capsule, TAKE 1 CAPSULE BY MOUTH AT BEDTIME AS NEEDED FOR SLEEP, Disp: 90 capsule, Rfl:  0   triamterene -hydrochlorothiazide (MAXZIDE-25) 37.5-25 MG tablet, TAKE 1 TABLET BY MOUTH IN THE  MORNING, Disp: 100 tablet, Rfl: 1   Turmeric 500 MG TABS, Take by mouth., Disp: , Rfl:

## 2024-03-17 ENCOUNTER — Other Ambulatory Visit: Payer: Self-pay | Admitting: Internal Medicine

## 2024-03-17 DIAGNOSIS — I10 Essential (primary) hypertension: Secondary | ICD-10-CM

## 2024-03-25 ENCOUNTER — Ambulatory Visit: Admitting: Internal Medicine

## 2024-03-25 ENCOUNTER — Encounter: Payer: Self-pay | Admitting: Internal Medicine

## 2024-03-25 VITALS — BP 128/70 | HR 65 | Temp 97.4°F | Wt 179.9 lb

## 2024-03-25 DIAGNOSIS — F339 Major depressive disorder, recurrent, unspecified: Secondary | ICD-10-CM | POA: Diagnosis not present

## 2024-03-25 MED ORDER — SERTRALINE HCL 25 MG PO TABS: 50.0000 mg | ORAL_TABLET | Freq: Every day | ORAL | Status: DC

## 2024-03-25 NOTE — Progress Notes (Signed)
 "    Established Patient Office Visit     CC/Reason for Visit: Follow-up depression  HPI: Richard Sparks is a 79 y.o. male who is coming in today for the above mentioned reasons.  At last visit we added sertraline  25 mg to his Wellbutrin  150 mg.  He feels like his mood has a significant improvement.   Past Medical/Surgical History: Past Medical History:  Diagnosis Date   Allergy    BENIGN PROSTATIC HYPERTROPHY 08/01/2006   DEPRESSION 08/01/2006   HYPERLIPIDEMIA 08/01/2006   HYPERTENSION 08/01/2006    Past Surgical History:  Procedure Laterality Date   COLONOSCOPY  last 12/31/2013   NO PAST SURGERIES      Social History:  reports that he has never smoked. He has never used smokeless tobacco. He reports that he does not drink alcohol and does not use drugs.  Allergies: Allergies[1]  Family History:  Family History  Problem Relation Age of Onset   Colon cancer Neg Hx    Esophageal cancer Neg Hx    Rectal cancer Neg Hx    Stomach cancer Neg Hx    Colon polyps Neg Hx     Current Medications[2]  Review of Systems:  Negative unless indicated in HPI.   Physical Exam: Vitals:   03/25/24 0825 03/25/24 0828 03/25/24 0833  BP: (!) 140/78 130/70 128/70  Pulse: 65    Temp: (!) 97.4 F (36.3 C)    TempSrc: Oral    SpO2: 99%    Weight: 179 lb 14.4 oz (81.6 kg)      Body mass index is 25.45 kg/m.     Impression and Plan:  Depression, recurrent (HCC) -     Sertraline  HCl; Take 2 tablets (50 mg total) by mouth daily.   - We discussed increasing sertraline  to 50 mg daily.  He will take 2 tablets of the 25 mg that he has at home and after 4 to 6 weeks will notify us  if he would like a prescription for the higher dose sent.  He is happy with progress so far.  Time spent:30 minutes reviewing chart, interviewing and examining patient and formulating plan of care.     [name], MD [facility]     [1]  Allergies Allergen  Reactions   Amoxicillin Rash  [2]  Current Outpatient Medications:    amLODipine  (NORVASC ) 10 MG tablet, TAKE 1 TABLET BY MOUTH DAILY, Disp: 100 tablet, Rfl: 1   atorvastatin  (LIPITOR) 40 MG tablet, TAKE 1 TABLET BY MOUTH DAILY, Disp: 100 tablet, Rfl: 1   b complex vitamins tablet, Take 1 tablet by mouth daily., Disp: , Rfl:    buPROPion  (WELLBUTRIN  XL) 150 MG 24 hr tablet, TAKE 1 TABLET BY MOUTH DAILY, Disp: 100 tablet, Rfl: 1   Cholecalciferol (VITAMIN D ) 1000 UNITS capsule, Take 1,000 Units by mouth daily., Disp: , Rfl:    fluticasone  (FLONASE ) 50 MCG/ACT nasal spray, Place 1 spray into both nostrils daily., Disp: 16 g, Rfl: 5   LINZESS  145 MCG CAPS capsule, TAKE 1 CAPSULE (145 MCG TOTAL) BY MOUTH DAILY BEFORE BREAKFAST. TAKE 30 MINUTES BEFORE BREAKFAST, Disp: 30 capsule, Rfl: 3   loratadine (CLARITIN) 10 MG tablet, Take 10 mg by mouth daily as needed., Disp: , Rfl:    Omega-3 Fatty Acids (FISH OIL) 1000 MG CAPS, Take 4 capsules by mouth daily., Disp: , Rfl:    oxymetazoline (AFRIN) 0.05 % nasal spray, Place 1 spray into both nostrils 2 (two) times  daily as needed for congestion., Disp: , Rfl:    Simethicone 180 MG CAPS, Take by mouth., Disp: , Rfl:    temazepam  (RESTORIL ) 15 MG capsule, TAKE 1 CAPSULE BY MOUTH AT BEDTIME AS NEEDED FOR SLEEP, Disp: 90 capsule, Rfl: 0   triamterene -hydrochlorothiazide (MAXZIDE-25) 37.5-25 MG tablet, TAKE 1 TABLET BY MOUTH IN THE  MORNING, Disp: 100 tablet, Rfl: 1   Turmeric 500 MG TABS, Take by mouth., Disp: , Rfl:    sertraline  (ZOLOFT ) 25 MG tablet, Take 2 tablets (50 mg total) by mouth daily., Disp: , Rfl:   "

## 2024-04-08 ENCOUNTER — Encounter: Payer: Self-pay | Admitting: Internal Medicine

## 2024-04-09 ENCOUNTER — Other Ambulatory Visit: Payer: Self-pay | Admitting: Internal Medicine

## 2024-04-09 DIAGNOSIS — F339 Major depressive disorder, recurrent, unspecified: Secondary | ICD-10-CM

## 2024-04-09 MED ORDER — SERTRALINE HCL 25 MG PO TABS: 25.0000 mg | ORAL_TABLET | Freq: Every day | ORAL | 1 refills | Status: AC

## 2024-04-11 ENCOUNTER — Other Ambulatory Visit (HOSPITAL_COMMUNITY): Payer: Self-pay
